# Patient Record
Sex: Female | Born: 1950 | Race: White | Hispanic: No | Marital: Married | Smoking: Never smoker
Health system: Southern US, Community
[De-identification: ages and names within clinical notes are randomized; demographics above are authoritative.]

## PROBLEM LIST (undated history)

## (undated) DIAGNOSIS — K219 Gastro-esophageal reflux disease without esophagitis: Secondary | ICD-10-CM

## (undated) DIAGNOSIS — Z8669 Personal history of other diseases of the nervous system and sense organs: Secondary | ICD-10-CM

## (undated) DIAGNOSIS — I6522 Occlusion and stenosis of left carotid artery: Secondary | ICD-10-CM

## (undated) DIAGNOSIS — I10 Essential (primary) hypertension: Secondary | ICD-10-CM

## (undated) DIAGNOSIS — L4050 Arthropathic psoriasis, unspecified: Secondary | ICD-10-CM

## (undated) DIAGNOSIS — I82401 Acute embolism and thrombosis of unspecified deep veins of right lower extremity: Secondary | ICD-10-CM

## (undated) DIAGNOSIS — T7840XA Allergy, unspecified, initial encounter: Secondary | ICD-10-CM

## (undated) DIAGNOSIS — R112 Nausea with vomiting, unspecified: Secondary | ICD-10-CM

## (undated) DIAGNOSIS — M069 Rheumatoid arthritis, unspecified: Secondary | ICD-10-CM

## (undated) DIAGNOSIS — I011 Acute rheumatic endocarditis: Secondary | ICD-10-CM

## (undated) DIAGNOSIS — H269 Unspecified cataract: Secondary | ICD-10-CM

## (undated) DIAGNOSIS — M1990 Unspecified osteoarthritis, unspecified site: Secondary | ICD-10-CM

## (undated) DIAGNOSIS — R238 Other skin changes: Secondary | ICD-10-CM

## (undated) DIAGNOSIS — R011 Cardiac murmur, unspecified: Secondary | ICD-10-CM

## (undated) DIAGNOSIS — C801 Malignant (primary) neoplasm, unspecified: Secondary | ICD-10-CM

## (undated) DIAGNOSIS — R0981 Nasal congestion: Secondary | ICD-10-CM

## (undated) DIAGNOSIS — Z9889 Other specified postprocedural states: Secondary | ICD-10-CM

## (undated) HISTORY — DX: Rheumatoid arthritis, unspecified: M06.9

## (undated) HISTORY — DX: Arthropathic psoriasis, unspecified: L40.50

## (undated) HISTORY — DX: Unspecified osteoarthritis, unspecified site: M19.90

## (undated) HISTORY — DX: Gastro-esophageal reflux disease without esophagitis: K21.9

## (undated) HISTORY — DX: Personal history of other diseases of the nervous system and sense organs: Z86.69

## (undated) HISTORY — DX: Malignant (primary) neoplasm, unspecified: C80.1

## (undated) HISTORY — DX: Acute rheumatic endocarditis: I01.1

## (undated) HISTORY — DX: Unspecified cataract: H26.9

## (undated) HISTORY — PX: ABDOMINAL HYSTERECTOMY: SHX81

## (undated) HISTORY — DX: Acute embolism and thrombosis of unspecified deep veins of right lower extremity: I82.401

## (undated) HISTORY — PX: SKIN CANCER EXCISION: SHX779

## (undated) HISTORY — DX: Essential (primary) hypertension: I10

## (undated) HISTORY — DX: Cardiac murmur, unspecified: R01.1

## (undated) HISTORY — DX: Occlusion and stenosis of left carotid artery: I65.22

## (undated) HISTORY — DX: Other skin changes: R23.8

## (undated) HISTORY — PX: BREAST EXCISIONAL BIOPSY: SUR124

## (undated) HISTORY — PX: SPINE SURGERY: SHX786

## (undated) HISTORY — DX: Nasal congestion: R09.81

## (undated) HISTORY — DX: Allergy, unspecified, initial encounter: T78.40XA

## (undated) HISTORY — PX: TOTAL VAGINAL HYSTERECTOMY: SHX2548

---

## 1999-09-17 ENCOUNTER — Other Ambulatory Visit: Admission: RE | Admit: 1999-09-17 | Discharge: 1999-09-17 | Payer: Self-pay | Admitting: Obstetrics & Gynecology

## 2000-09-23 ENCOUNTER — Other Ambulatory Visit: Admission: RE | Admit: 2000-09-23 | Discharge: 2000-09-23 | Payer: Self-pay | Admitting: Obstetrics & Gynecology

## 2001-04-16 ENCOUNTER — Encounter (INDEPENDENT_AMBULATORY_CARE_PROVIDER_SITE_OTHER): Payer: Self-pay | Admitting: *Deleted

## 2001-04-16 ENCOUNTER — Ambulatory Visit (HOSPITAL_COMMUNITY): Admission: RE | Admit: 2001-04-16 | Discharge: 2001-04-16 | Payer: Self-pay | Admitting: Obstetrics & Gynecology

## 2001-11-09 ENCOUNTER — Encounter (INDEPENDENT_AMBULATORY_CARE_PROVIDER_SITE_OTHER): Payer: Self-pay | Admitting: Specialist

## 2001-11-09 ENCOUNTER — Observation Stay (HOSPITAL_COMMUNITY): Admission: RE | Admit: 2001-11-09 | Discharge: 2001-11-10 | Payer: Self-pay | Admitting: Obstetrics & Gynecology

## 2002-06-22 ENCOUNTER — Other Ambulatory Visit: Admission: RE | Admit: 2002-06-22 | Discharge: 2002-06-22 | Payer: Self-pay | Admitting: Obstetrics & Gynecology

## 2002-12-13 ENCOUNTER — Encounter: Admission: RE | Admit: 2002-12-13 | Discharge: 2002-12-13 | Payer: Self-pay | Admitting: Obstetrics & Gynecology

## 2002-12-13 ENCOUNTER — Encounter: Payer: Self-pay | Admitting: Obstetrics & Gynecology

## 2002-12-20 ENCOUNTER — Encounter: Payer: Self-pay | Admitting: General Surgery

## 2002-12-20 ENCOUNTER — Ambulatory Visit (HOSPITAL_BASED_OUTPATIENT_CLINIC_OR_DEPARTMENT_OTHER): Admission: RE | Admit: 2002-12-20 | Discharge: 2002-12-20 | Payer: Self-pay | Admitting: General Surgery

## 2002-12-20 ENCOUNTER — Encounter (INDEPENDENT_AMBULATORY_CARE_PROVIDER_SITE_OTHER): Payer: Self-pay | Admitting: *Deleted

## 2002-12-20 ENCOUNTER — Encounter: Admission: RE | Admit: 2002-12-20 | Discharge: 2002-12-20 | Payer: Self-pay | Admitting: General Surgery

## 2003-06-24 ENCOUNTER — Other Ambulatory Visit: Admission: RE | Admit: 2003-06-24 | Discharge: 2003-06-24 | Payer: Self-pay | Admitting: Obstetrics & Gynecology

## 2004-01-10 ENCOUNTER — Encounter: Admission: RE | Admit: 2004-01-10 | Discharge: 2004-01-10 | Payer: Self-pay | Admitting: Obstetrics & Gynecology

## 2004-05-17 ENCOUNTER — Emergency Department (HOSPITAL_COMMUNITY): Admission: EM | Admit: 2004-05-17 | Discharge: 2004-05-17 | Payer: Self-pay | Admitting: Emergency Medicine

## 2004-08-20 ENCOUNTER — Encounter: Admission: RE | Admit: 2004-08-20 | Discharge: 2004-08-20 | Payer: Self-pay | Admitting: Obstetrics & Gynecology

## 2004-09-16 ENCOUNTER — Emergency Department (HOSPITAL_COMMUNITY): Admission: EM | Admit: 2004-09-16 | Discharge: 2004-09-16 | Payer: Self-pay | Admitting: Family Medicine

## 2004-09-24 ENCOUNTER — Other Ambulatory Visit: Admission: RE | Admit: 2004-09-24 | Discharge: 2004-09-24 | Payer: Self-pay | Admitting: Obstetrics & Gynecology

## 2004-10-26 ENCOUNTER — Ambulatory Visit: Payer: Self-pay | Admitting: Cardiology

## 2004-11-23 ENCOUNTER — Emergency Department (HOSPITAL_COMMUNITY): Admission: EM | Admit: 2004-11-23 | Discharge: 2004-11-23 | Payer: Self-pay | Admitting: Family Medicine

## 2005-01-22 ENCOUNTER — Ambulatory Visit (HOSPITAL_COMMUNITY): Admission: RE | Admit: 2005-01-22 | Discharge: 2005-01-22 | Payer: Self-pay | Admitting: Obstetrics & Gynecology

## 2005-09-25 DIAGNOSIS — C439 Malignant melanoma of skin, unspecified: Secondary | ICD-10-CM

## 2005-09-25 HISTORY — DX: Malignant melanoma of skin, unspecified: C43.9

## 2005-10-18 ENCOUNTER — Other Ambulatory Visit: Admission: RE | Admit: 2005-10-18 | Discharge: 2005-10-18 | Payer: Self-pay | Admitting: Obstetrics & Gynecology

## 2006-01-29 ENCOUNTER — Encounter: Admission: RE | Admit: 2006-01-29 | Discharge: 2006-01-29 | Payer: Self-pay | Admitting: Obstetrics & Gynecology

## 2006-10-09 DIAGNOSIS — D229 Melanocytic nevi, unspecified: Secondary | ICD-10-CM

## 2006-10-09 HISTORY — DX: Melanocytic nevi, unspecified: D22.9

## 2007-02-03 ENCOUNTER — Encounter: Admission: RE | Admit: 2007-02-03 | Discharge: 2007-02-03 | Payer: Self-pay | Admitting: Obstetrics & Gynecology

## 2009-07-17 ENCOUNTER — Ambulatory Visit (HOSPITAL_COMMUNITY): Admission: RE | Admit: 2009-07-17 | Discharge: 2009-07-17 | Payer: Self-pay | Admitting: Neurosurgery

## 2009-11-07 ENCOUNTER — Ambulatory Visit (HOSPITAL_COMMUNITY): Admission: RE | Admit: 2009-11-07 | Discharge: 2009-11-07 | Payer: Self-pay | Admitting: Neurosurgery

## 2010-08-25 ENCOUNTER — Encounter: Payer: Self-pay | Admitting: Obstetrics & Gynecology

## 2010-08-27 ENCOUNTER — Other Ambulatory Visit: Payer: Self-pay

## 2010-08-31 ENCOUNTER — Encounter
Admission: RE | Admit: 2010-08-31 | Discharge: 2010-08-31 | Payer: Self-pay | Source: Home / Self Care | Attending: Gastroenterology | Admitting: Gastroenterology

## 2010-11-06 LAB — BASIC METABOLIC PANEL
BUN: 9 mg/dL (ref 6–23)
CO2: 27 mEq/L (ref 19–32)
Calcium: 9.2 mg/dL (ref 8.4–10.5)
Chloride: 105 mEq/L (ref 96–112)
Creatinine, Ser: 0.95 mg/dL (ref 0.4–1.2)
GFR calc Af Amer: >60 mL/min (ref >60)
GFR calc non Af Amer: >60 mL/min (ref >60)
Glucose, Bld: 106 mg/dL — ABNORMAL HIGH (ref 70–99)
Potassium: 4 mEq/L (ref 3.5–5.1)
Sodium: 137 mEq/L (ref 135–145)

## 2010-11-06 LAB — CBC
HCT: 35.8 % — ABNORMAL LOW (ref 36.0–46.0)
Hemoglobin: 12.3 g/dL (ref 12.0–15.0)
MCHC: 34.4 g/dL (ref 30.0–36.0)
MCV: 89.2 fL (ref 78.0–100.0)
Platelets: 196 10*3/uL (ref 150–400)
RBC: 4.01 MIL/uL (ref 3.87–5.11)
RDW: 14 % (ref 11.5–15.5)
WBC: 6.8 10*3/uL (ref 4.0–10.5)

## 2010-12-21 NOTE — H&P (Signed)
[facility]  Patient:    Connie Cisneros, Connie Cisneros Visit Number: [id number] MRN: [id number]          Service Type: DSU Location: 9300 [id number] Attending Physician:  Minette Headland Dictated by:   Freddy Finner, M.D. Admit Date:  11/09/2001                           History and Physical  ADMITTING DIAGNOSES:          1. Uterine leiomyomata.                               2. Menorrhagia.                               3. Intolerance of oral contraceptives.                               4. No improvement in menorrhagia with cyclic                                  hormonal therapy including Prometrium 12 days                                  a cycle.                               5. Failed hysteroscopy, dilatation and curettage                                  in September 2002.  HISTORY OF PRESENT ILLNESS:   The patient is a 60 year old white married female with three living children who has symptoms as noted in the admitting diagnoses.  She has elected now to proceed with definitive surgical intervention and is admitted now for laparoscopically-assisted vaginal hysterectomy and bilateral salpingo-oophorectomy.  She has reviewed a video in the office describing the operative procedure including the potential risks of the procedure and possible complications.  She is prepared to proceed.  REVIEW OF SYSTEMS:            Her current review of systems is otherwise negative.  There are no cardiopulmonary, GI or GU complaints.  PAST MEDICAL HISTORY:         The patient has no known significant medical illnesses.  She has never had a blood transfusion.  MEDICATIONS:                  She does take Wellbutrin 150 mg SR b.i.d. She is on no other current medications.  She did try Mircette for control of menorrhagia with frequent headaches and irregular menses.  PAST SURGICAL HISTORY:        Previous operative procedures include hysteroscopy, D&C.  She has had  three vaginal deliveries.  SOCIAL HISTORY:               She does not use cigarettes.  ALLERGIES:  She has no known allergies to medications.  FAMILY HISTORY:               Mother died of breast cancer.  An older sister died at 53 years old of bone tumor.  Family history is otherwise noncontributory.  PHYSICAL EXAMINATION:  VITAL SIGNS:                  Blood pressure in the office is 110/80.  HEENT:                        Grossly within normal limits.  NECK:                         The thyroid gland is not palpably enlarged to my examination.  CHEST:                        Clear to auscultation.  HEART:                        Normal sinus rhythm without murmurs, rubs or gallops.  BREASTS:                      Exam is normal.  There are no palpable masses. No skin change.  No nipple discharge.  (Recent mammogram in December 2002 was normal).  ABDOMEN:                      Soft and nontender without appreciable organomegaly or palpable masses.  PELVIC:                       External genitalia, vagina and cervix are normal.  Bimanual reveals the uterus to be slightly enlarged.  There are no palpable adnexal masses.  RECTAL:                       The rectum is normal.  The rectovaginal exam confirms.  LABORATORY DATA:              Ultrasound in the office showed numerous small leiomyomata, and this was noted by ultrasound.  ASSESSMENT:                   1. Uterine fibroids.                               2. Menorrhagia unresponsive to conservative                                  therapy.  PLAN:                         Laparoscopically-assisted vaginal hysterectomy and bilateral salpingo-oophorectomy. Dictated by:   Freddy Finner, M.D. Attending Physician:  Minette Headland DD:  11/08/01 TD:  11/08/01 Job: [phone]

## 2010-12-21 NOTE — Discharge Summary (Signed)
[facility]  Patient:    Connie Cisneros, Connie Cisneros Visit Number: [id number] MRN: [id number]          Service Type: DSU Location: 9300 [id number] Attending Physician:  Minette Headland Dictated by:   Freddy Finner, M.D. Admit Date:  11/09/2001 Discharge Date: 11/10/2001                             Discharge Summary  DISCHARGE DIAGNOSES: 1. Uterine leiomyomata. 2. Clinical symptoms of menorrhagia, unresponsive to cyclic hormonal therapy    and conservative surgery with dilatation and curettage.  OPERATIVE PROCEDURE:  Laparoscopically assisted vaginal hysterectomy, bilateral salpingo-oophorectomy.  INTRAOPERATIVE AND POSTOPERATIVE COMPLICATIONS:  None.  DISPOSITION:  The patient is in satisfactory and improved condition at the time of her discharge.  ACTIVITY:  Progressively increasing physical activity.  She is to avoid vaginal entry.  WOUND CARE:  She is to call for fever, severe pain, or heavy bleeding.  DISCHARGE MEDICATIONS: 1. Vicodin p.r.n. postoperative pain. 2. Premarin 0.625 mg q.d.  FOLLOWUP:  Return to the office in two weeks for her first postoperative visit.  For details of the present illness, past surgical history, family history, review of systems, and physical examination is in the admission and operative summary.  ADMISSION FINDINGS:  Remarkable for enlargement of the uterus with irregular nodularity consistent with fibroids.  LABORATORY DATA:  CBC on admission with hemoglobin of 12.2, platelets of 211,000, white count of 4.1.  Postoperatively, her hemoglobin was 10.  Her admission prothrombin time, PTT, and INR were all normal.  Admission urinalysis was unremarkable.  PATHOLOGY REPORT:  Not available at time of dictation.  HOSPITAL COURSE:  The patient was admitted on the morning of surgery.  She was treated perioperatively with TIS hose and with IV Cefotan.  The above described operative procedure was accomplished  without difficulty.  The patient tolerated the operative procedure well.  Her postoperative recovery was without complications.  By the afternoon of the first postoperative day, she was ambulating without difficulty, having adequate bowel and bladder function, and tolerating a regular diet.  She was discharged home with disposition as noted above. Dictated by:   Freddy Finner, M.D. Attending Physician:  Minette Headland DD:  11/10/01 TD:  11/11/01 Job: [id number]

## 2010-12-21 NOTE — Op Note (Signed)
[facility]  Patient:    Connie Cisneros, Connie Cisneros Visit Number: [id number] MRN: [id number]          Service Type: DSU Location: [facility] Attending Physician:  Minette Headland Proc. Date: 04/16/01 Admit Date:  04/16/2001                             Operative Report  PREOPERATIVE DIAGNOSES:       1. Uterine fibroids.                               2. Endometrial thickening.                               3. Menorrhagia.                               4. Intolerance of oral contraceptives.  POSTOPERATIVE DIAGNOSES:      1. Uterine fibroids.                               2. Endometrial thickening.                               3. Menorrhagia.                               4. Intolerance of oral contraceptives.  OPERATION:                    Hysteroscopy D&C.  SURGEON:                      Freddy Finner, M.D.  ANESTHESIA:                   General.  INTRAOPERATIVE COMPLICATIONS:                None.  ESTIMATED INTRAOPERATIVE BLOOD LOSS:    20 cc.  INTRAOPERATIVE SORBITOL DEFICIT:             75 cc.  INDICATIONS:                  The patient is a 60 year old, who has had menometrorrhagia.  She has been tried on oral contraceptives with frequent side effects requiring her to stop.  On recent pelvic ultrasound, she had endometrial thickening.  She had uterine leiomyomata.  She is admitted now for hysteroscopy D&C.  INTRAOPERATIVE FINDINGS:      There was a suggestion of a broad-based, flat polyp anteriorly, and initially the uterus looked like it had a midline septum which was very small.  This disappeared after curettage.  DESCRIPTION OF PROCEDURE:     The patient was brought to the operating room, placed under adequate general anesthesia, placed in the dorsal lithotomy position, using the Allen stirrup system.  Betadine prep was carried out in the usual fashion.  Bivalve speculum was introduced.  Cervix was grasped with a single-tooth tenaculum,  progressively dilated to 23 with Pratts.  The ACMI 12.5 degree hysteroscope was introduced using sorbitol 3% as the distending medium.  Examination of the  cavity was carried out with findings as noted above.  Gentle, thorough curettage was then carried out and exploration with the polyp forceps.  Reinspection revealed absence of what was possibly a broad-based polyp and the appearance of a septum.  The cavity at this point was normal except for being slightly enlarged.  There was no evidence of submucous myoma.  The procedure at this point was terminated, all instruments removed.  The patient was taken to the recovery room in good condition.  She will be discharged with routine outpatient surgical instructions with follow-up in the office in 7-10 days. Attending Physician:  Minette Headland DD:  04/16/01 TD:  04/16/01 Job: [id number]

## 2010-12-21 NOTE — Op Note (Signed)
[facility]  Patient:    Connie Cisneros, Connie Cisneros Visit Number: [id number] MRN: [id number]          Service Type: DSU Location: 9300 [id number] Attending Physician:  Minette Headland Dictated by:   Freddy Finner, M.D. Proc. Date: 11/09/01 Admit Date:  11/09/2001                             Operative Report  PREOPERATIVE DIAGNOSIS:  Fibroids.  POSTOPERATIVE DIAGNOSIS:  Fibroids.  OPERATION: 1. Laparoscopic-assisted vaginal hysterectomy. 2. Bilateral salpingo-oophorectomy.  SURGEON:  Freddy Finner, M.D.  ASSISTANT:  Duke Salvia. Marcelle Overlie, M.D.  ANESTHESIA:  General endotracheal anesthesia.  ESTIMATED BLOOD LOSS: 250 cc.  INTRAOPERATIVE COMPLICATIONS:  None.  INDICATIONS:  Details of History of Present Illness recorded in admission note.  DESCRIPTION OF PROCEDURE:  The patient was admitted on the morning of surgery. She was given a bolus of Ceftin IV preoperatively.  She was placed in PAS hose.  She was brought to the operating room, placed under adequate general endotracheal anesthesia.  The abdomen, perineum, and vagina were prepped in the usual fashion with scrub followed by solution.  Sterile drapes were applied.  Bladder was evacuated with [facility] catheter.  Hulka tenaculum was attached to the cervix.  Sterile drape was then applied.  Two small incisions were made, one at the umbilicus and one just above the symphysis.  In the upper incision, a 10 mm trocar was introduced without difficulty.  Laparoscope revealed adequate placement without evidence of injury on entry. Pneumoperitoneum was allowed to accumulate with carbon dioxide gas.  A 5 mm trocar was placed through the lower incision under direct visualization. Blunt probe and grasping forceps were used through this trocar sleeve.  Systematic examination of pelvic and abdominal contents carried out.  The only abnormality noted was the uterine fibroids.  Using the grasping forceps  and the plasma coagulator device through the operating channel of the laparoscope, the fallopian tube was elevated.  The broad ligament and infundibulopelvic ligament were controlled with bipolar coagulation sharp division.  This was carried down to the level at or just above the uterine arteries.  Attention was then turned vaginally.  Posterior vaginal retractor was placed. Hulka tenaculum was removed.  Cervix was grasped with the [facility] tenaculum. Colpotomy incision was made while tenting the cul-de-sac with an Allis. Cervix was circumscribed with a scalpel to release the mucosa.  The bladder was advanced off the cervix.  The plasma coagulator system for vaginal application was then used to develop the uterosacral, the bladder pillars, and the cardinal ligament pedicles.  The anterior peritoneum was entered.  The patient was given IV indigo carmine to be certain of absence of bladder perforation, but this was not felt to be the case and no spillage was noted. The vessel pedicles were taken with the Ligasure system and coagulated.  The uterus was then delivered through the vaginal introitus.  Angles of the vagina were anchored to the uterosacrals with mattress sutures of 0 Monocryl.  The uterosacrals were plicated with 0 Monocryl.  Closed with figure-of-eight of 0 Monocryl.  Foley was placed.  Reinspection abdominally through the laparoscopic instruments was carried out. Copious irrigation with the Nezhat irrigator aspirator was carried out.  Small bleeding sources near the cuff and along the left side were controlled with the plasma coagulation system.  Once complete hemostasis was achieved, the procedure was terminated.  Gas was allowed  to escape from the abdomen, and all the irrigating solution was aspirated.  The incisions were closed with interrupted subcuticular sutures of 3-0 Dexon.  The patient tolerated the procedure well and was taken to the recovery room in good  condition. Dictated by:   Freddy Finner, M.D. Attending Physician:  Minette Headland DD:  11/09/01 TD:  11/09/01 Job: [phone]

## 2010-12-21 NOTE — Op Note (Signed)
   NAME:  Connie Cisneros, Connie Cisneros                       ACCOUNT NO.:  [id number]   MEDICAL RECORD NO.:  [id number]                   PATIENT TYPE:  AMB   LOCATION:  DSC                                  FACILITY:  [facility]   PHYSICIAN:  Rose Phi. Young, M.D.                DATE OF BIRTH:  May 31, 1951   DATE OF PROCEDURE:  12/20/2002  DATE OF DISCHARGE:                                 OPERATIVE REPORT   PREOPERATIVE DIAGNOSIS:  Possible radial scar of the left breast.   POSTOPERATIVE DIAGNOSIS:  Possible radial scar of the left breast.   PROCEDURE:  Excision of left breast mass with needle localization and  specimen mammography.   SURGEON:  Rose Phi. Maple Hudson, M.D.   ANESTHESIA:  MAC.   DESCRIPTION OF PROCEDURE:  The patient was placed on the operating table and  the left breast prepped and draped in the usual fashion.  The previously-  placed wire was in the about 1 o'clock position of the left breast.  A  curvilinear incision was then outlined incorporating the wire puncture site.  The area was then thoroughly infiltrated with local anesthetic.  An incision  was made and a wide excision of the wire and surrounding tissue was carried  out.  Hemostasis obtained with the cautery.   Specimen mammography confirmed the removal of the lesion.   Subcuticular closure of 4-0 Monocryl and Steri-Strips carried out.  Dressing  applied.  The patient transferred to the recovery room in satisfactory  condition, having tolerated the procedure well.                                               Rose Phi. Maple Hudson, M.D.    PRY/MEDQ  D:  12/20/2002  T:  12/21/2002  Job:  [id number]   cc:   Freddy Finner, M.D.  [address] [place]  [place] [address]  Fax: [phone]

## 2011-07-02 ENCOUNTER — Other Ambulatory Visit: Payer: Self-pay | Admitting: Physician Assistant

## 2011-09-03 ENCOUNTER — Other Ambulatory Visit: Payer: Self-pay | Admitting: Obstetrics & Gynecology

## 2011-09-03 DIAGNOSIS — N63 Unspecified lump in unspecified breast: Secondary | ICD-10-CM

## 2011-09-10 ENCOUNTER — Ambulatory Visit
Admission: RE | Admit: 2011-09-10 | Discharge: 2011-09-10 | Disposition: A | Discharge status: Home / Self Care | Payer: 59 | Source: Ambulatory Visit | Attending: Obstetrics & Gynecology | Admitting: Obstetrics & Gynecology

## 2011-09-10 DIAGNOSIS — N63 Unspecified lump in unspecified breast: Secondary | ICD-10-CM

## 2013-04-13 DIAGNOSIS — R5383 Other fatigue: Secondary | ICD-10-CM | POA: Insufficient documentation

## 2013-05-07 ENCOUNTER — Ambulatory Visit (INDEPENDENT_AMBULATORY_CARE_PROVIDER_SITE_OTHER): Payer: 59 | Admitting: Podiatrist

## 2013-05-07 ENCOUNTER — Encounter: Payer: Self-pay | Admitting: Podiatrist

## 2013-05-07 VITALS — BP 178/89 | HR 69 | Resp 16 | Ht 63.0 in | Wt 167.0 lb

## 2013-05-07 DIAGNOSIS — L600 Ingrowing nail: Secondary | ICD-10-CM

## 2013-05-07 NOTE — Patient Instructions (Signed)
Betadine Soak Instructions  Purchase an 8 oz. bottle of BETADINE solution (Povidone)  THE DAY AFTER THE PROCEDURE  Place 1 tablespoon of betadine solution in a quart of warm tap water.  Submerge your foot or feet with outer bandage intact for the initial soak; this will allow the bandage to become moist and wet for easy lift off.  Once you remove your bandage, continue to soak in the solution for 20 minutes.  This soak should be done twice a day.  Next, remove your foot or feet from solution, blot dry the affected area and cover.  You may use a band aid large enough to cover the area or use gauze and tape.  Apply other medications to the area as directed by the doctor such as cortisporin otic solution (ear drops) or neosporin.  IF YOUR SKIN BECOMES IRRITATED WHILE USING THESE INSTRUCTIONS, IT IS OKAY TO SWITCH TO EPSOM SALTS AND WATER OR ANTIBACTERIAL SOAP AND WATER

## 2013-05-07 NOTE — Progress Notes (Signed)
  Chief Complaint  Patient presents with  . Ingrown Toenail    left 2nd toe- lateral side,  I think I have an ingrown toenail.  It's been red and swollen.     HPI: Patient is 62 y.o. female who presents today for pain left 2nd toenail lateral side.  Patient states she's had ingrown toenails in the past and she's had several procedures to remove the ingrown toenails which have all been successful. Patient denies any drainage or streaking to the toe however she states it is swollen and uncomfortable.   Review of Systems  DATA OBTAINED: from patient GENERAL: Feels well no fevers, no fatigue, no changes in appetite SKIN: No itching, no rashes, no open lesions, no wounds EYES: No eye pain,no redness, no discharge EARS: No earache,no ringing of ears, no recent change in hearing NOSE: No congestion, no drainage, no bleeding  MOUTH/THROAT: No mouth pain, No sore throat, No difficulty chewing or swallowing  RESPIRATORY: No cough, no wheezing, no SOB CARDIAC: No chest pain,no heart palpitations,no new onset lower extremity edema  GI: No abdominal pain, No Nausea, no vomiting, no diarrhea, no heartburn or no reflux  GU: No dysuria, no increased frequency or urgency MUSCULOSKELETAL: No unrelieved bone/joint pain,  NEUROLOGIC: Awake, alert, appropriate to situation, No change in mental status. PSYCHIATRIC: No overt anxiety or sadness.No behavior issue.  AMBULATION:  Ambulates unassisted    Physical Exam  GENERAL APPEARANCE: Alert, conversant. Appropriately groomed. No acute distress.  VASCULAR: Pedal pulses palpable and strong bilateral.  Capillary refill time is immediate to all digits,  Proximal to distal cooling it warm to warm.  Digital hair growth is present bilateral  NEUROLOGIC: sensation is intact epicritically and protectively to 5.07 monofilament at 5/5 sites bilateral.  Light touch is intact bilateral, vibratory sensation intact bilateral, achilles tendon reflex is intact bilateral.   MUSCULOSKELETAL: acceptable muscle strength, tone and stability bilateral.  Intrinsic muscluature intact bilateral.  Rectus appearance of foot and digits noted bilateral.   DERMATOLOGIC: skin color, texture, and turger are within normal limits.  No preulcerative lesions are seen, no interdigital maceration noted.  No open lesions present.  Digital nails are asymptomatic. With the exception of the left second toenail lateral nail border which is painful and symptomatic with pressure. Incurvation of the lateral nail border is also noted with pain with pressure noted.   Assessment   Ingrown toenail second toe left lateral nail border  Plan  No orders of the defined types were placed in this encounter.    Treatment options and alternatives discussed.  Recommended permanent phenol matrixectomy and patient agreed.  Left 2nd toe was prepped with alcohol and a 1 to 1 mix of 0.5% marcaine plain and 2% lidocaine plain was administered in a digital block fashion.  The toe was then prepped with betadine solution and exsanguinated.  The offending nail border was then excised and matrix tissue exposed.  Phenol was then applied to the matrix tissue followed by an alcohol wash.  Antibiotic ointment and a dry sterile dressing was applied.  The patient was dispensed instructions for aftercare.     Delories Heinz, DPM

## 2013-07-20 ENCOUNTER — Ambulatory Visit (INDEPENDENT_AMBULATORY_CARE_PROVIDER_SITE_OTHER): Payer: 59 | Admitting: Cardiovascular Disease

## 2013-07-20 ENCOUNTER — Encounter: Payer: Self-pay | Admitting: Cardiovascular Disease

## 2013-07-20 VITALS — BP 150/84 | HR 70 | Ht 64.0 in | Wt 166.5 lb

## 2013-07-20 DIAGNOSIS — G43909 Migraine, unspecified, not intractable, without status migrainosus: Secondary | ICD-10-CM | POA: Insufficient documentation

## 2013-07-20 DIAGNOSIS — I6522 Occlusion and stenosis of left carotid artery: Secondary | ICD-10-CM

## 2013-07-20 DIAGNOSIS — E785 Hyperlipidemia, unspecified: Secondary | ICD-10-CM

## 2013-07-20 DIAGNOSIS — I10 Essential (primary) hypertension: Secondary | ICD-10-CM

## 2013-07-20 DIAGNOSIS — E041 Nontoxic single thyroid nodule: Secondary | ICD-10-CM

## 2013-07-20 DIAGNOSIS — I6529 Occlusion and stenosis of unspecified carotid artery: Secondary | ICD-10-CM

## 2013-07-20 NOTE — Progress Notes (Signed)
Patient ID: Connie Cisneros, female   DOB: 12-31-1950, 62 y.o.   MRN: [id number]     PATIENT PROFILE:  [name] is a 62 year old female who is referred for cardiology evaluation through the courtesy of Dr. Herb Grays.   HPI: [name] is a 62 year old female who has a history of somewhat labile blood pressure. She also has a history of migraine headaches. Recently, she was started on losartan at 100 mg as well as verapamil 120 mg. She previously had been on losartan HCTZ. She recently underwent evaluation of possible thyroid cyst which showed a normal thyroid size and scattered small colloid cysts and partially septated cysts. There was no evidence for any solid nodules. As part of that evaluation note is made of some mild plaque formation involving the bilateral carotid bulbs. She tells me last week she underwent a comprehensive carotid duplex exam at [facility] health care. I finally was able to obtain the results of this procedure which showed less than 20% diameter reduction of the left proximal internal carotid artery not felt to be hemodynamically significant. Her right internal carotid was normal. She had normal antegrade vertebral flow.  [name] admits to a history of a heart murmur. She denies any episodes of chest pain. In 2011, she did undergo a nuclear perfusion study which revealed normal perfusion and function without scar or ischemia. An echo Doppler study done in 2011 showed normal systolic function with mild mitral regurgitation and mild tricuspid regurgitation. Presently, she denies any episodes of chest pain. She denies presyncope or syncope. Her blood pressure has been somewhat labile. She presents for evaluation.  Past Medical History  Diagnosis Date  . Sinus congestion   . 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 Hypertension   . Cancer   . Heart murmur   . GERD (gastroesophageal reflux disease)   . Bruises easily   . History of  migraine headaches     Past Surgical History  Procedure Laterality Date  . Total vaginal hysterectomy      Allergies  Allergen Reactions  . Codeine Nausea Only  . Sulfa Antibiotics Nausea Only    Current Outpatient Prescriptions  Medication Sig Dispense Refill  . atorvastatin (LIPITOR) 20 MG tablet Take 20 mg by mouth daily.      . cholecalciferol (VITAMIN D) 1000 UNITS tablet Take 1,000 Units by mouth daily.      . diphenhydrAMINE (BENADRYL) 25 MG tablet Take 25 mg by mouth 1 day or 1 dose.      . estradiol (ESTRACE) 2 MG tablet Take 2 mg by mouth daily.      Marland Kitchen losartan (COZAAR) 100 MG tablet Take 100 mg by mouth daily.      . verapamil (VERELAN PM) 120 MG 24 hr capsule Take 120 mg by mouth at bedtime.      . B-D INS SYR MICROFINE 1CC/28G 28G X 1/2" 1 ML MISC Self allergy injections       No current facility-administered medications for this visit.    Social history is notable in that she is married for 38 years. She has 3 children. She works for the [employer]. She completed 3 years of college. There is no tobacco or alcohol use. She does walk approximately 3-4 days per week.  Family History  Problem Relation Age of Onset  . Cancer Mother   . Diabetes Father   . Heart disease Father   .  Cancer Sister   . Heart disease Paternal Grandmother   . Heart disease Paternal Grandfather     ROS is negative for fever chills or night sweats. She denies significant change in weight. She denies vision changes or hearing changes. She is unaware of any lymphadenopathy. She denies increasing shortness of breath. There is no cough. There is no wheezing. She denies musculoskeletal complaints. She does have thyroid nodules. She denies cold or heat intolerance. There is no diabetes. She denies chest pressure. She denies presyncope or syncope. She denies nausea vomiting or diarrhea. There is no reflux. She does have a history of hyperlipidemia. She denies change in bowel or bladder habits.  She denies blood in stool or urine. She denies claudication. She denies tremors. There are no myalgias. There are no neurologic symptoms. She denies difficulty with sleep. Other comprehensive 14 point system review is negative.  PE BP 150/84  Pulse 70  Ht 5\' 4"  (1.626 m)  Wt 166 lb 8 oz (75.524 kg)  BMI 28.57 kg/m2 General: Alert, oriented, no distress.  Skin: normal turgor, no rashes HEENT: Normocephalic, atraumatic. Pupils round and reactive; sclera anicteric; Fundi mild increased vessel tortuosity. No hemorrhages or exudates. Nose without nasal septal hypertrophy Mouth/Parynx benign; Mallinpatti scale 2 Neck: No JVD, no carotid bruits Lungs: clear to ausculatation and percussion; no wheezing or rales Chest wall: without tenderness to palpitation Heart: RRR, s1 s2 normal 1-2/6 systolic murmur in the aortic area left sternal border Abdomen: soft, nontender; no hepatosplenomehaly, BS+; abdominal aorta nontender and not dilated by palpation. Back: no CVA tenderness Pulses 2+ Extremities: no clubbinbg cyanosis or edema, Homan's sign negative  Neurologic: grossly nonfocal; Cranial nerves grossly wnl Psychologic: Normal mood and affect    ECG: Normal sinus rhythm at 70 beats per minute. Normal intervals. Non specific ST changes  LABS:  BMET    Component Value Date/Time   NA 137 07/11/2009 1421   K 4.0 07/11/2009 1421   CL 105 07/11/2009 1421   CO2 27 07/11/2009 1421   GLUCOSE 106* 07/11/2009 1421   BUN 9 07/11/2009 1421   CREATININE 0.95 07/11/2009 1421   CALCIUM 9.2 07/11/2009 1421   GFRNONAA >60 07/11/2009 1421   GFRAA  Value: >60        The eGFR has been calculated using the MDRD equation. This calculation has not been validated in all clinical situations. eGFR's persistently <60 mL/min signify possible Chronic Kidney Disease. 07/11/2009 1421     Hepatic Function Panel  No results found for this basename: prot, albumin, ast, alt, alkphos, bilitot, bilidir, ibili     CBC      Component Value Date/Time   WBC 6.8 07/11/2009 1421   RBC 4.01 07/11/2009 1421   HGB 12.3 07/11/2009 1421   HCT 35.8* 07/11/2009 1421   PLT 196 07/11/2009 1421   MCV 89.2 07/11/2009 1421   MCHC 34.4 07/11/2009 1421   RDW 14.0 07/11/2009 1421     BNP No results found for this basename: probnp    Lipid Panel  No results found for this basename: chol, trig, hdl, cholhdl, vldl, ldlcalc     RADIOLOGY: No results found.   ASSESSMENT AND PLAN: My impression is that [name] is a 62 year old female who does have a history of hypertension, as well as family history for coronary disease in addition to hyperlipidemia. Her blood pressure today was mildly elevated. I am recommending further titration of her land from 120 mg to 180 mg. I was able to  obtain the report from [facility] concerning her carotid duplex scan which does not reveal significant carotid disease but only minimal plaque. She does have a cardiac murmur and I am recommending a  followup echo Doppler study for further evaluation. I was also able to review laboratory data she had done in September 2014 which revealed a normal chemistry profile. TSH was 1.873 B12 and folate levels were normal. Iron saturation was on the low normal side at 17%. Do not know her lipid panel the did discuss with her and want to be aggressive with reference to her lipid status and attempt to prevent plaque progression and may be worthwhile to consider doing an MR profile progressive assessment. I will see her back in the office in followup of the above studies further recommendations will be made at that time.   Lennette Bihari, MD, [facility] 07/20/2013 7:23 PM

## 2013-07-21 ENCOUNTER — Telehealth: Payer: Self-pay | Admitting: Cardiovascular Disease

## 2013-07-21 ENCOUNTER — Other Ambulatory Visit: Payer: Self-pay | Admitting: *Deleted

## 2013-07-21 DIAGNOSIS — I10 Essential (primary) hypertension: Secondary | ICD-10-CM

## 2013-07-21 DIAGNOSIS — E782 Mixed hyperlipidemia: Secondary | ICD-10-CM

## 2013-07-21 MED ORDER — VERAPAMIL HCL ER 180 MG PO TBCR: 180.0000 mg | EXTENDED_RELEASE_TABLET | Freq: Every day | ORAL | Status: DC

## 2013-07-21 NOTE — Addendum Note (Signed)
Addended byGaynelle Cage. on: 07/21/2013 12:32 PM   Modules accepted: Orders

## 2013-07-21 NOTE — Patient Instructions (Addendum)
Your physician has requested that you have an echocardiogram. Echocardiography is a painless test that uses sound waves to create images of your heart. It provides your doctor with information about the size and shape of your heart and how well your heart's chambers and valves are working. This procedure takes approximately one hour. There are no restrictions for this procedure.  Your physician recommends that you return for lab work fasting.  Your physician recommends that you schedule a follow-up appointment in: 4 WEEKS.  Your physician has recommended you make the following change in your medication: increase the veralan to 180 mg.

## 2013-07-21 NOTE — Telephone Encounter (Signed)
Spoke with patient.

## 2013-07-21 NOTE — Telephone Encounter (Signed)
Returning your call. °

## 2013-07-22 ENCOUNTER — Other Ambulatory Visit: Payer: Self-pay | Admitting: *Deleted

## 2013-07-22 ENCOUNTER — Encounter: Payer: Self-pay | Admitting: Cardiovascular Disease

## 2013-07-22 DIAGNOSIS — I10 Essential (primary) hypertension: Secondary | ICD-10-CM

## 2013-07-22 LAB — COMPREHENSIVE METABOLIC PANEL
ALT: 13 U/L (ref 0–35)
AST: 15 U/L (ref 0–37)
Albumin: 3.9 g/dL (ref 3.5–5.2)
Alkaline Phosphatase: 89 U/L (ref 39–117)
BUN: 11 mg/dL (ref 6–23)
CO2: 23 mEq/L (ref 19–32)
Calcium: 9.5 mg/dL (ref 8.4–10.5)
Chloride: 104 mEq/L (ref 96–112)
Creat: 0.85 mg/dL (ref 0.50–1.10)
Glucose, Bld: 78 mg/dL (ref 70–99)
Potassium: 4 mEq/L (ref 3.5–5.3)
Sodium: 139 mEq/L (ref 135–145)
Total Bilirubin: 0.5 mg/dL (ref 0.3–1.2)
Total Protein: 7.5 g/dL (ref 6.0–8.3)

## 2013-07-22 MED ORDER — VERAPAMIL HCL ER 180 MG PO TBCR: 180.0000 mg | EXTENDED_RELEASE_TABLET | Freq: Every day | ORAL | Status: DC

## 2013-07-23 LAB — NMR LIPOPROFILE WITH LIPIDS
Cholesterol, Total: 159 mg/dL (ref <200)
HDL Particle Number: 38.9 umol/L (ref >=30.5)
HDL Size: 9 nm — ABNORMAL LOW (ref >=9.2)
HDL-C: 54 mg/dL (ref >=40)
LDL (calc): 90 mg/dL (ref <100)
LDL Particle Number: 1249 nmol/L — ABNORMAL HIGH (ref <1000)
LDL Size: 20.3 nm — ABNORMAL LOW (ref >20.5)
LP-IR Score: 37 (ref <=45)
Large HDL-P: 6.4 umol/L (ref >=4.8)
Large VLDL-P: 1.7 nmol/L (ref <=2.7)
Small LDL Particle Number: 779 nmol/L — ABNORMAL HIGH (ref <=527)
Triglycerides: 75 mg/dL (ref <150)
VLDL Size: 42.2 nm (ref <=46.6)

## 2013-07-26 ENCOUNTER — Telehealth: Payer: Self-pay | Admitting: *Deleted

## 2013-07-26 NOTE — Telephone Encounter (Signed)
Message copied by Gaynelle Cage on Mon Jul 26, 2013  4:41 PM ------      Message from: Nicki Guadalajara A      Created: Mon Jul 26, 2013 10:19 AM       Inc atorvastatin to 40 mg daily ------

## 2013-07-26 NOTE — Telephone Encounter (Signed)
Left message to return a call to discuss lab results. 

## 2013-07-27 ENCOUNTER — Telehealth: Payer: Self-pay | Admitting: *Deleted

## 2013-07-27 MED ORDER — ATORVASTATIN CALCIUM 40 MG PO TABS: 40.0000 mg | ORAL_TABLET | Freq: Every day | ORAL | Status: DC

## 2013-07-27 NOTE — Telephone Encounter (Signed)
Patient returned a call to me from yesterday. Lab results and recommendations were given to patient per Dr. Landry Dyke instructions.

## 2013-07-27 NOTE — Telephone Encounter (Signed)
Message copied by Gaynelle Cage on Tue Jul 27, 2013  8:52 AM ------      Message from: Nicki Guadalajara A      Created: Mon Jul 26, 2013 10:19 AM       Inc atorvastatin to 40 mg daily ------

## 2013-08-03 ENCOUNTER — Ambulatory Visit (HOSPITAL_COMMUNITY)
Admission: RE | Admit: 2013-08-03 | Discharge: 2013-08-03 | Disposition: A | Discharge status: Home / Self Care | Payer: 59 | Source: Ambulatory Visit | Attending: Cardiovascular Disease | Admitting: Cardiovascular Disease

## 2013-08-03 ENCOUNTER — Telehealth: Payer: Self-pay | Admitting: Cardiovascular Disease

## 2013-08-03 DIAGNOSIS — R011 Cardiac murmur, unspecified: Secondary | ICD-10-CM | POA: Insufficient documentation

## 2013-08-03 DIAGNOSIS — I10 Essential (primary) hypertension: Secondary | ICD-10-CM

## 2013-08-03 DIAGNOSIS — G43909 Migraine, unspecified, not intractable, without status migrainosus: Secondary | ICD-10-CM | POA: Insufficient documentation

## 2013-08-03 DIAGNOSIS — K219 Gastro-esophageal reflux disease without esophagitis: Secondary | ICD-10-CM | POA: Insufficient documentation

## 2013-08-03 MED ORDER — ATORVASTATIN CALCIUM 40 MG PO TABS: 40.0000 mg | ORAL_TABLET | Freq: Every day | ORAL | Status: DC

## 2013-08-03 NOTE — Telephone Encounter (Signed)
Patient has not received her prescription for Lipitor 40 mg # 30 in the mail yet.  Please call this in to [facility] on [address].

## 2013-08-03 NOTE — Telephone Encounter (Signed)
Returned call and pt verified x 2.  Pt stated she still hasn't received the prescription in the mail and is out of the medicine she had at home.  Pt informed refill will be sent to [facility] [address] per request for 30-day supply.  Pt advised to call the office if Rx not received in the next few days so it can be sent electronically.  Pt verbalized understanding and stated she wants to see how she does on the increased dose before it is sent to mail order.  Refill(s) sent to pharmacy.

## 2013-08-03 NOTE — Progress Notes (Signed)
2D Echo Performed 08/03/2013    Clearence Ped, RCS

## 2013-08-27 ENCOUNTER — Encounter: Payer: Self-pay | Admitting: *Deleted

## 2013-09-01 ENCOUNTER — Ambulatory Visit (INDEPENDENT_AMBULATORY_CARE_PROVIDER_SITE_OTHER): Payer: 59 | Admitting: Cardiovascular Disease

## 2013-09-01 ENCOUNTER — Encounter: Payer: Self-pay | Admitting: Cardiovascular Disease

## 2013-09-01 VITALS — BP 158/86 | HR 69 | Ht 64.0 in | Wt 164.0 lb

## 2013-09-01 DIAGNOSIS — E785 Hyperlipidemia, unspecified: Secondary | ICD-10-CM

## 2013-09-01 DIAGNOSIS — I10 Essential (primary) hypertension: Secondary | ICD-10-CM

## 2013-09-01 DIAGNOSIS — G43909 Migraine, unspecified, not intractable, without status migrainosus: Secondary | ICD-10-CM

## 2013-09-01 DIAGNOSIS — I6529 Occlusion and stenosis of unspecified carotid artery: Secondary | ICD-10-CM

## 2013-09-01 MED ORDER — VERAPAMIL HCL ER 240 MG PO TBCR: 240.0000 mg | EXTENDED_RELEASE_TABLET | Freq: Every day | ORAL | Status: DC

## 2013-09-01 NOTE — Progress Notes (Signed)
Patient ID: CLARETHA TOWNSHEND, female   DOB: [birth year], 63 y.o.   MRN: [id number]      HPI:  Ms. Breindel Collier is a 63 year old female who I initially saw through the referral of Dr. Florina Ou 6 weeks ago. She now presents for cardiology followup evaluation.   Ms. Herbie Baltimore is a 63 year old female who has a history of somewhat labile blood pressure. She also has a history of migraine headaches. Recently, she was started on losartan at 100 mg as well as verapamil 120 mg. She previously had been on losartan HCTZ. She recently underwent evaluation of possible thyroid cyst which showed a normal thyroid size and scattered small colloid cysts and partially septated cysts. There was no evidence for any solid nodules. As part of that evaluation note is made of some mild plaque formation involving the bilateral carotid bulbs. She tells me last week she underwent a comprehensive carotid duplex exam at [facility] health care. I finally was able to obtain the results of this procedure which showed less than 20% diameter reduction of the left proximal internal carotid artery not felt to be hemodynamically significant. Her right internal carotid was normal. She had normal antegrade vertebral flow.  Ms. Redondo admits to a history of a heart murmur. She denies any episodes of chest pain. In 2011, she did undergo a nuclear perfusion study which revealed normal perfusion and function without scar or ischemia. An echo Doppler study done in 2011 showed normal systolic function with mild mitral regurgitation and mild tricuspid regurgitation. Presently, she denies any episodes of chest pain. She denies presyncope or syncope. Her blood pressure has been somewhat labile.   Since I last saw her, she underwent a 2-D echo Doppler study to evaluate cardiac murmur appears she was found to have normal systolic function with an ejection fraction of 60-65%. Her mitral valve is mildly thickened without significant regurgitation.  Atrial dimensions were normal. She had a normal aortic valve. There was trivial tricuspid regurgitation.  Laboratory was done which showed an LDL particle number of 1249 with a calculated LDL of 90, HDL 54 triglycerides 75 total cholesterol 159. Some resistance score was normal at 37 to she profile including liver function studies were normal. She presents for evaluation  Past Medical History  Diagnosis Date  . Sinus congestion   . 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 Hypertension   . Cancer   . Heart murmur   . GERD (gastroesophageal reflux disease)   . Bruises easily   . History of migraine headaches     Past Surgical History  Procedure Laterality Date  . Total vaginal hysterectomy      Allergies  Allergen Reactions  . Codeine Nausea Only  . Sulfa Antibiotics Nausea Only    Current Outpatient Prescriptions  Medication Sig Dispense Refill  . amoxicillin-clavulanate (AUGMENTIN) 875-125 MG per tablet as directed.      . B-D INS SYR MICROFINE 1CC/28G 28G X 1/2" 1 ML MISC Self allergy injections      . cholecalciferol (VITAMIN D) 1000 UNITS tablet Take 1,000 Units by mouth daily.      . diphenhydrAMINE (BENADRYL) 25 MG tablet Take 25 mg by mouth 1 day or 1 dose.      . estradiol (ESTRACE) 2 MG tablet Take 2 mg by mouth daily.      Marland Kitchen losartan (COZAAR) 100 MG tablet Take 100 mg by mouth daily.      Marland Kitchen  ondansetron (ZOFRAN-ODT) 4 MG disintegrating tablet as needed.      Marland Kitchen atorvastatin (LIPITOR) 40 MG tablet Take 1 tablet (40 mg total) by mouth daily.  30 tablet  0  . verapamil (CALAN-SR) 240 MG CR tablet Take 1 tablet (240 mg total) by mouth at bedtime.  30 tablet  6   No current facility-administered medications for this visit.    Social history is notable in that she is married for 38 years. She has 3 children. She works for the [employer]. She completed 3 years of college. There is no tobacco or alcohol use. She does walk approximately 3-4 days per  week.  Family History  Problem Relation Age of Onset  . Cancer Mother   . Diabetes Father   . Heart disease Father   . Cancer Sister   . Heart disease Paternal Grandmother   . Heart disease Paternal Grandfather     ROS is negative for fever chills or night sweats. She denies recent migraine headaches. She denies significant change in weight. She denies vision changes or hearing changes. She is unaware of any lymphadenopathy. She denies increasing shortness of breath. There is no cough. There is no wheezing. She denies musculoskeletal complaints. She does have thyroid nodules. She denies cold or heat intolerance. There is no diabetes. She denies chest pressure. She denies presyncope or syncope. She denies nausea vomiting or diarrhea. There is no reflux. She does have a history of hyperlipidemia. She denies change in bowel or bladder habits. She denies blood in stool or urine. She denies claudication. She denies tremors. There are no myalgias. There are no neurologic symptoms. She denies difficulty with sleep. Other comprehensive 14 point system review is negative.  PE BP 158/86  Pulse 69  Ht 5' 4"  (1.626 m)  Wt 164 lb (74.39 kg)  BMI 28.14 kg/m2 General: Alert, oriented, no distress.  Skin: normal turgor, no rashes HEENT: Normocephalic, atraumatic. Pupils round and reactive; sclera anicteric; Fundi mild increased vessel tortuosity. No hemorrhages or exudates. Nose without nasal septal hypertrophy Mouth/Parynx benign; Mallinpatti scale 2 Neck: No JVD, no carotid bruits Lungs: clear to ausculatation and percussion; no wheezing or rales Chest wall: without tenderness to palpitation Heart: RRR, s1 s2 normal 3-8/8 systolic murmur in the aortic area left sternal border Abdomen: soft, nontender; no hepatosplenomehaly, BS+; abdominal aorta nontender and not dilated by palpation. Back: no CVA tenderness Pulses 2+ Extremities: no clubbinbg cyanosis or edema, Homan's sign negative  Neurologic:  grossly nonfocal; Cranial nerves grossly wnl Psychologic: Normal mood and affect    ECG (independently read by me): Normal sinus rhythm at 69 beats per minute. No significant ST changes. Normal intervals.  Prior ECG from 07/21/2013: Normal sinus rhythm at 70 beats per minute. Normal intervals. Non specific ST changes  LABS:  BMET    Component Value Date/Time   NA 139 07/21/2013 1333   K 4.0 07/21/2013 1333   CL 104 07/21/2013 1333   CO2 23 07/21/2013 1333   GLUCOSE 78 07/21/2013 1333   BUN 11 07/21/2013 1333   CREATININE 0.85 07/21/2013 1333   CREATININE 0.95 07/11/2009 1421   CALCIUM 9.5 07/21/2013 1333   GFRNONAA >60 07/11/2009 1421   GFRAA  Value: >60        The eGFR has been calculated using the MDRD equation. This calculation has not been validated in all clinical situations. eGFR's persistently <60 mL/min signify possible Chronic Kidney Disease. 07/11/2009 1421     Hepatic Function Panel  Component Value Date/Time   PROT 7.5 07/21/2013 1333     CBC    Component Value Date/Time   WBC 6.8 07/11/2009 1421   RBC 4.01 07/11/2009 1421   HGB 12.3 07/11/2009 1421   HCT 35.8* 07/11/2009 1421   PLT 196 07/11/2009 1421   MCV 89.2 07/11/2009 1421   MCHC 34.4 07/11/2009 1421   RDW 14.0 07/11/2009 1421     BNP No results found for this basename: probnp    Lipid Panel  No results found for this basename: chol,  trig,  hdl,  cholhdl,  vldl,  ldlcalc     RADIOLOGY: No results found.   ASSESSMENT AND PLAN:  Ms. Shaneeka Scarboro is a 63 year old female with a history of hypertension, as well as family history for coronary disease in addition to hyperlipidemia. Her blood pressure today was mildly elevated a repeat blood pressure by me was 160/84. She states oftentimes her blood pressure at home is in the 130s. Presently, I am recommending further titration of her verapamil to 240 mg daily which should be helpful for occasional intermittent chest fluttering spells as well as  blood pressure. I did review her echo Doppler study which did not demonstrate significant valvular pathology. I suspect her murmur mostly more of a flow murmur. She did not have aortic stenosis or sclerosis. I reviewed her laboratory in detail. She currently is on atorvastatin 40 mg. We discussed improvement in her diet and presently I will not further increase her Lipitor presently at ideal LDL particle number is less than 1000. We discussed increased exercise. She's not having any chest pain. I will see her in 6 months for cardiology reevaluation to Troy Sine, MD, [facility] 09/01/2013 7:57 PM

## 2013-09-01 NOTE — Patient Instructions (Signed)
Your physician has recommended you make the following change in your medication: increase the verapramil  To 240 mg. This has already been sent to the pharmacy.  Your physician recommends that you schedule a follow-up appointment in:6 months.

## 2014-03-18 ENCOUNTER — Encounter: Payer: Self-pay | Admitting: Podiatrist

## 2014-03-18 ENCOUNTER — Ambulatory Visit: Payer: 59 | Admitting: Podiatrist

## 2014-03-18 ENCOUNTER — Ambulatory Visit (INDEPENDENT_AMBULATORY_CARE_PROVIDER_SITE_OTHER): Payer: 59

## 2014-03-18 VITALS — BP 148/77 | HR 67 | Resp 16

## 2014-03-18 DIAGNOSIS — M722 Plantar fascial fibromatosis: Secondary | ICD-10-CM

## 2014-03-18 MED ORDER — MELOXICAM 15 MG PO TABS: 15.0000 mg | ORAL_TABLET | Freq: Every day | ORAL | Status: DC

## 2014-03-18 MED ORDER — TRIAMCINOLONE ACETONIDE 10 MG/ML IJ SUSP
10.0000 mg | Freq: Once | INTRAMUSCULAR | Status: AC
  Administered 2014-03-18: 10 mg

## 2014-03-18 NOTE — Patient Instructions (Signed)

## 2014-03-18 NOTE — Progress Notes (Signed)
  Chief Complaint  Patient presents with  . Foot Pain    plantar heels bilateral     HPI: Patient is 63 y.o. female who presents today for a new problem in plantar heels bilateral for about 1 year. AM pain. Worse with standing still. Wearing supportive shoe and no bare feet.   Physical Exam  Patient is awake, alert, and oriented x 3.  In no acute distress.  Vascular status is intact with palpable pedal pulses at 2/4 DP and PT bilateral and capillary refill time within normal limits. Neurological sensation is also intact bilaterally via Semmes Weinstein monofilament at 5/5 sites. Light touch, vibratory sensation, Achilles tendon reflex is intact. Dermatological exam reveals skin color, turger and texture as normal. No open lesions present.  Musculature intact with dorsiflexion, plantarflexion, inversion, eversion.  Rectus foot type is noted. Pain on palpation plantar medial aspect of bilateral heels is present. No pain with tapping along the tarsal canal is present. X-rays are normal. No inferior calcaneal spurring is present.  Assessment: Under fasciitis bilateral  Plan:Discussed treatment options and at this time a plantar fascial injection was recommended.  The patient agreed and a sterile skin prep was applied.  An injection consisting of kenalog and marcaine mixture was infiltrated at the point of maximal tenderness on the bilateral Heels.  The patient tolerated this well and was given instructions for aftercare. She was dispensed stretching exercises and shoe gear changes. Discussed power step inserts. She will be seen back as needed for followup or if the symptoms do not resolve in 2-3 weeks.

## 2014-06-21 ENCOUNTER — Other Ambulatory Visit: Payer: Self-pay | Admitting: Obstetrics & Gynecology

## 2014-06-21 DIAGNOSIS — N644 Mastodynia: Secondary | ICD-10-CM

## 2014-06-21 DIAGNOSIS — N6459 Other signs and symptoms in breast: Secondary | ICD-10-CM

## 2014-06-22 ENCOUNTER — Ambulatory Visit
Admission: RE | Admit: 2014-06-22 | Discharge: 2014-06-22 | Disposition: A | Discharge status: Home / Self Care | Payer: 59 | Source: Ambulatory Visit | Attending: Obstetrics & Gynecology | Admitting: Obstetrics & Gynecology

## 2014-06-22 ENCOUNTER — Other Ambulatory Visit: Payer: Self-pay | Admitting: Obstetrics & Gynecology

## 2014-06-22 DIAGNOSIS — N6459 Other signs and symptoms in breast: Secondary | ICD-10-CM

## 2014-06-22 DIAGNOSIS — N644 Mastodynia: Secondary | ICD-10-CM

## 2014-07-12 ENCOUNTER — Ambulatory Visit
Admission: RE | Admit: 2014-07-12 | Discharge: 2014-07-12 | Disposition: A | Discharge status: Home / Self Care | Payer: 59 | Source: Ambulatory Visit | Attending: Obstetrics & Gynecology | Admitting: Obstetrics & Gynecology

## 2014-07-12 ENCOUNTER — Other Ambulatory Visit: Payer: Self-pay | Admitting: Obstetrics & Gynecology

## 2014-07-12 DIAGNOSIS — N644 Mastodynia: Secondary | ICD-10-CM

## 2014-07-12 DIAGNOSIS — N6459 Other signs and symptoms in breast: Secondary | ICD-10-CM

## 2014-07-13 ENCOUNTER — Ambulatory Visit (INDEPENDENT_AMBULATORY_CARE_PROVIDER_SITE_OTHER): Payer: 59 | Admitting: Podiatrist

## 2014-07-13 ENCOUNTER — Encounter: Payer: Self-pay | Admitting: Podiatrist

## 2014-07-13 VITALS — BP 164/92 | HR 80 | Resp 12

## 2014-07-13 DIAGNOSIS — M722 Plantar fascial fibromatosis: Secondary | ICD-10-CM

## 2014-07-20 ENCOUNTER — Other Ambulatory Visit: Payer: Self-pay | Admitting: Obstetrics & Gynecology

## 2014-07-20 DIAGNOSIS — N644 Mastodynia: Secondary | ICD-10-CM

## 2014-07-20 DIAGNOSIS — N6459 Other signs and symptoms in breast: Secondary | ICD-10-CM

## 2014-07-21 NOTE — Progress Notes (Signed)
Chief Complaint  Patient presents with  . Plantar Fasciitis    ''LT FOOT IS DOING BETTER, BUT RT FOOT STILL GET SORE SOMETIMES.''     HPI: Patient is 63 y.o. female who presents today for continued soreness on the right heel. She states the left foot was better after the injection however the right foot has continued to be bothersome. She denies any trauma or injury to the foot.   Allergies  Allergen Reactions  . Codeine Nausea Only  . Sulfa Antibiotics Nausea Only    Physical Exam  Neurovascular status is unchanged. Pain on palpation plantar medial aspect of the right heel is noted consistent with plantar fasciitis symptomatology.  Assessment: Plantar fasciitis right greater than left  Plan: Injected the right heel for injection #2. Recommended continued stretching exercises and use of supportive shoes. Discussed positive benefits of orthotic therapy as well. She'll be seen back in 4 weeks for recheck.

## 2014-07-26 ENCOUNTER — Ambulatory Visit
Admission: RE | Admit: 2014-07-26 | Discharge: 2014-07-26 | Disposition: A | Discharge status: Home / Self Care | Payer: 59 | Source: Ambulatory Visit | Attending: Obstetrics & Gynecology | Admitting: Obstetrics & Gynecology

## 2014-07-26 ENCOUNTER — Other Ambulatory Visit: Payer: Self-pay | Admitting: Obstetrics & Gynecology

## 2014-07-26 DIAGNOSIS — R599 Enlarged lymph nodes, unspecified: Secondary | ICD-10-CM

## 2014-07-26 DIAGNOSIS — N644 Mastodynia: Secondary | ICD-10-CM

## 2014-07-26 DIAGNOSIS — N6459 Other signs and symptoms in breast: Secondary | ICD-10-CM

## 2014-08-01 LAB — HM DEXA SCAN

## 2014-08-12 ENCOUNTER — Other Ambulatory Visit: Payer: Self-pay | Admitting: Surgery

## 2014-08-12 ENCOUNTER — Other Ambulatory Visit (INDEPENDENT_AMBULATORY_CARE_PROVIDER_SITE_OTHER): Payer: Self-pay | Admitting: Surgery

## 2014-09-13 ENCOUNTER — Other Ambulatory Visit: Payer: Self-pay

## 2014-09-13 MED ORDER — VERAPAMIL HCL ER 240 MG PO TBCR: 240.0000 mg | EXTENDED_RELEASE_TABLET | Freq: Every day | ORAL | Status: DC

## 2014-09-13 NOTE — Telephone Encounter (Signed)
Rx(s) sent to pharmacy electronically.  

## 2014-11-18 ENCOUNTER — Encounter: Payer: Self-pay | Admitting: Podiatrist

## 2014-11-18 ENCOUNTER — Ambulatory Visit (INDEPENDENT_AMBULATORY_CARE_PROVIDER_SITE_OTHER): Payer: 59 | Admitting: Podiatrist

## 2014-11-18 VITALS — BP 158/88 | HR 72 | Resp 12

## 2014-11-18 DIAGNOSIS — M722 Plantar fascial fibromatosis: Secondary | ICD-10-CM

## 2014-11-18 MED ORDER — NAPROXEN 500 MG PO TABS: 500.0000 mg | ORAL_TABLET | Freq: Two times a day (BID) | ORAL | Status: DC

## 2014-11-18 MED ORDER — METHYLPREDNISOLONE 4 MG PO TBPK: ORAL_TABLET | ORAL | Status: DC

## 2014-11-18 NOTE — Patient Instructions (Signed)

## 2014-11-18 NOTE — Progress Notes (Signed)
Chief Complaint  Patient presents with  . Plantar Fasciitis    "no more problems with left foot, but still having right heel"     HPI: Patient is 64 y.o. female who presents today for continued soreness on the right heel. She states the left foot was better after the injection however the right foot has continued to be bothersome. She denies any trauma or injury to the foot.   Allergies  Allergen Reactions  . Codeine Nausea Only  . Sulfa Antibiotics Nausea Only    Physical Exam  Neurovascular status is unchanged. Pain on palpation plantar medial aspect of the right heel is noted consistent with plantar fasciitis symptomatology.  Assessment: Plantar fasciitis right greater than left  Plan: Injected the right heel for injection #3. Prescription for Medrol Dosepak and naproxen sodium was also dispensed. Discussed that should her foot pain not resolve can consider boot therapy, physical therapy versus epat therapy

## 2015-01-16 ENCOUNTER — Other Ambulatory Visit: Payer: Self-pay

## 2015-01-16 MED ORDER — ATORVASTATIN CALCIUM 40 MG PO TABS: 40.0000 mg | ORAL_TABLET | Freq: Every day | ORAL | Status: DC

## 2015-01-16 MED ORDER — VERAPAMIL HCL ER 240 MG PO TBCR: 240.0000 mg | EXTENDED_RELEASE_TABLET | Freq: Every day | ORAL | Status: DC

## 2015-01-16 NOTE — Telephone Encounter (Signed)
Rx(s) sent to pharmacy electronically.  

## 2015-01-24 ENCOUNTER — Other Ambulatory Visit: Payer: Self-pay | Admitting: Physician Assistant

## 2015-01-30 ENCOUNTER — Other Ambulatory Visit: Payer: Self-pay | Admitting: Obstetrics & Gynecology

## 2015-01-31 LAB — CYTOLOGY - PAP

## 2015-03-13 ENCOUNTER — Encounter: Payer: Self-pay | Admitting: Cardiovascular Disease

## 2015-03-13 ENCOUNTER — Ambulatory Visit (INDEPENDENT_AMBULATORY_CARE_PROVIDER_SITE_OTHER): Payer: Commercial Managed Care - HMO | Admitting: Cardiovascular Disease

## 2015-03-13 VITALS — BP 132/80 | HR 73 | Ht 63.0 in | Wt 168.4 lb

## 2015-03-13 DIAGNOSIS — I10 Essential (primary) hypertension: Secondary | ICD-10-CM

## 2015-03-13 DIAGNOSIS — Z79899 Other long term (current) drug therapy: Secondary | ICD-10-CM | POA: Diagnosis not present

## 2015-03-13 DIAGNOSIS — I6521 Occlusion and stenosis of right carotid artery: Secondary | ICD-10-CM

## 2015-03-13 DIAGNOSIS — E785 Hyperlipidemia, unspecified: Secondary | ICD-10-CM | POA: Diagnosis not present

## 2015-03-13 MED ORDER — VERAPAMIL HCL ER 240 MG PO TBCR: 240.0000 mg | EXTENDED_RELEASE_TABLET | Freq: Every day | ORAL | Status: DC

## 2015-03-13 MED ORDER — LOSARTAN POTASSIUM-HCTZ 50-12.5 MG PO TABS: 1.0000 | ORAL_TABLET | Freq: Every day | ORAL | Status: DC

## 2015-03-13 NOTE — Patient Instructions (Signed)
Your physician recommends that you return for lab work fasting.   Your physician wants you to follow-up in:1 year or sooner if needed with Dr. Claiborne Billings. You will receive a reminder letter in the mail two months in advance. If you don't receive a letter, please call our office to schedule the follow-up appointment.

## 2015-03-15 ENCOUNTER — Encounter: Payer: Self-pay | Admitting: Cardiovascular Disease

## 2015-03-15 NOTE — Progress Notes (Signed)
Patient ID: Connie Cisneros, female   DOB: 03-Jun-1951, 64 y.o.   MRN: [id number]      HPI:  [name] is a 64 year old female who I initially saw through the referral of Dr. Florina Ou.  I last saw her in January 2015.  She presents for follow-up cardiology evaluation.  [name]  has a history of hypertension, with mild labile blood pressure. She  has a history of migraine headaches. Remotely she was started on losartan at 100 mg as well as verapamil 120 mg. She previously had been on losartan HCTZ. She recently underwent evaluation of possible thyroid cyst which showed a normal thyroid size and scattered small colloid cysts and partially septated cysts. There was no evidence for any solid nodules. As part of that evaluation note is made of some mild plaque formation involving the bilateral carotid bulbs. A carotid duplex exam at [facility] health care showed less than 20% diameter reduction of the left proximal internal carotid artery not felt to be hemodynamically significant. Her right internal carotid was normal. She had normal antegrade vertebral flow.  [name] admits to a history of a heart murmur. She denies any episodes of chest pain. In 2011, she did undergo a nuclear perfusion study which revealed normal perfusion and function without scar or ischemia. An echo Doppler study done in 2011 showed normal systolic function with mild mitral regurgitation and mild tricuspid regurgitation. Presently, she denies any episodes of chest pain. She denies presyncope or syncope. Her blood pressure has been somewhat labile.   A 2-D echo Doppler study in 2014 demonstrated normal systolic function with an ejection fraction of 60-65%. Her mitral valve was mildly thickened without significant regurgitation. Atrial dimensions were normal. She had a normal aortic valve. There was trivial tricuspid regurgitation.  He has a history of hyperlipidemia, and a previous NMR profile demonstrated LDL  particle number of 1249 with a calculated LDL of 90, HDL 54 triglycerides 75 total cholesterol 159. Insulin resistance score was normal at 37.  Over the past year and a half, she has continued to do well.  She does try to exercise.  She has GERD for which he takes omeprazole.  She has been taking Calan SR 240 mg at bedtime and losartan HCT 50/12.5 mg for hypertension.  She has tolerated atorvastatin 40 mg for hyperlipidemia.  She also takes meloxicam as needed for arthritic symptoms.  She did experience some vague stomach discomfort last week but this has resolved.  She denies chest pain.  She is unaware of palpitations.  She denies PND or orthopnea.  She denies difficulty with sleep.  She presents for evaluation.  Past Medical History  Diagnosis Date  . Sinus congestion   . 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 Hypertension   . Cancer   . Heart murmur   . GERD (gastroesophageal reflux disease)   . Bruises easily   . History of migraine headaches     Past Surgical History  Procedure Laterality Date  . Total vaginal hysterectomy      Allergies  Allergen Reactions  . Codeine Nausea Only  . Sulfa Antibiotics Nausea Only    Current Outpatient Prescriptions  Medication Sig Dispense Refill  . atorvastatin (LIPITOR) 40 MG tablet Take 40 mg by mouth daily.    . cholecalciferol (VITAMIN D) 1000 UNITS tablet Take 1,000 Units by mouth daily.    . diphenhydrAMINE (BENADRYL) 25 MG tablet Take  25 mg by mouth 1 day or 1 dose.    . estradiol (ESTRACE) 2 MG tablet Take 2 mg by mouth daily.    Marland Kitchen losartan-hydrochlorothiazide (HYZAAR) 50-12.5 MG per tablet Take 1 tablet by mouth daily. Take 1 tab daily 30 tablet 11  . meloxicam (MOBIC) 15 MG tablet Take 1 tablet (15 mg total) by mouth daily. 30 tablet 2  . omeprazole (PRILOSEC) 40 MG capsule Take 1 capsule by mouth daily. Take 1 cap daily  0  . VAGIFEM 10 MCG TABS vaginal tablet as needed. As needed  0  . verapamil (CALAN-SR) 240  MG CR tablet Take 1 tablet (240 mg total) by mouth at bedtime. 30 tablet 11  . atorvastatin (LIPITOR) 40 MG tablet Take 1 tablet (40 mg total) by mouth daily. MUST KEEP APPOINTMENT 03/13/15 WITH DR Claiborne Billings FOR FUTURE REFILLS 30 tablet 2   No current facility-administered medications for this visit.    Social history is notable in that she is married for 38 years. She has 3 children. She works for the [employer]. She completed 3 years of college. There is no tobacco or alcohol use. She does walk approximately 3-4 days per week.  Family History  Problem Relation Age of Onset  . Cancer Mother   . Diabetes Father   . Heart disease Father   . Cancer Sister   . Heart disease Paternal Grandmother   . Heart disease Paternal Grandfather    ROS General: Negative; No fevers, chills, or night sweats;  HEENT: Negative; No changes in vision or hearing, sinus congestion, difficulty swallowing Pulmonary: Negative; No cough, wheezing, shortness of breath, hemoptysis Cardiovascular: Negative; No chest pain, presyncope, syncope, palpitations GI: Negative; No nausea, vomiting, diarrhea, or abdominal pain GU: Negative; No dysuria, hematuria, or difficulty voiding Musculoskeletal: Negative; no myalgias, joint pain, or weakness Hematologic/Oncology: Negative; no easy bruising, bleeding Endocrine: Negative; no heat/cold intolerance; no diabetes Neuro: Positive for history of migraine headaches; no changes in balance,  Skin: Negative; No rashes or skin lesions Psychiatric: Negative; No behavioral problems, depression Sleep: Negative; No snoring, daytime sleepiness, hypersomnolence, bruxism, restless legs, hypnogognic hallucinations, no cataplexy Other comprehensive 14 point system review is negative.   PE BP 132/80 mmHg  Pulse 73  Ht 5' 3" (1.6 m)  Wt 168 lb 7 oz (76.403 kg)  BMI 29.84 kg/m2   Wt Readings from Last 3 Encounters:  03/13/15 168 lb 7 oz (76.403 kg)  09/01/13 164 lb (74.39 kg)    07/20/13 166 lb 8 oz (75.524 kg)   General: Alert, oriented, no distress.  Skin: normal turgor, no rashes HEENT: Normocephalic, atraumatic. Pupils round and reactive; sclera anicteric; Fundi mild increased vessel tortuosity. No hemorrhages or exudates. Nose without nasal septal hypertrophy Mouth/Parynx benign; Mallinpatti scale 2 Neck: No JVD, no carotid bruits Lungs: clear to ausculatation and percussion; no wheezing or rales Chest wall: without tenderness to palpitation Heart: RRR, s1 s2 normal 4-8/2 systolic murmur in the aortic area left sternal border Abdomen: soft, nontender; no hepatosplenomehaly, BS+; abdominal aorta nontender and not dilated by palpation. Back: no CVA tenderness Pulses 2+ Extremities: no clubbing cyanosis or edema, Homan's sign negative  Neurologic: grossly nonfocal; Cranial nerves grossly wnl Psychologic: Normal mood and affect  ECG (independently read by me): Normal sinus rhythm at 73 bpm.  No ectopy.  Normal intervals.  January 2015 ECG (independently read by me): Normal sinus rhythm at 69 beats per minute. No significant ST changes. Normal intervals.  Prior ECG from 07/21/2013:  Normal sinus rhythm at 70 beats per minute. Normal intervals. Non specific ST changes  LABS:  BMP Latest Ref Rng 07/21/2013 07/11/2009  Glucose 70 - 99 mg/dL 78 106(H)  BUN 6 - 23 mg/dL 11 9  Creatinine 0.50 - 1.10 mg/dL 0.85 0.95  Sodium 135 - 145 mEq/L 139 137  Potassium 3.5 - 5.3 mEq/L 4.0 4.0  Chloride 96 - 112 mEq/L 104 105  CO2 19 - 32 mEq/L 23 27  Calcium 8.4 - 10.5 mg/dL 9.5 9.2   Hepatic Function Latest Ref Rng 07/21/2013  Total Protein 6.0 - 8.3 g/dL 7.5  Albumin 3.5 - 5.2 g/dL 3.9  AST 0 - 37 U/L 15  ALT 0 - 35 U/L 13  Alk Phosphatase 39 - 117 U/L 89  Total Bilirubin 0.3 - 1.2 mg/dL 0.5   CBC Latest Ref Rng 07/11/2009  WBC 4.0 - 10.5 K/uL 6.8  Hemoglobin 12.0 - 15.0 g/dL 12.3  Hematocrit 36.0 - 46.0 % 35.8(L)  Platelets 150 - 400 K/uL 196   Lab Results   Component Value Date   MCV 89.2 07/11/2009   No results found for: TSH No results found for: HGBA1C  Lipid Panel     Component Value Date/Time   CHOL 159 07/21/2013 1336   TRIG 75 07/21/2013 1336   HDL 54 07/21/2013 1336   LDLCALC 90 07/21/2013 1336    RADIOLOGY: No results found.   ASSESSMENT AND PLAN:  [name] is a 64 year old female with a history of hypertension,hyperlipidemia and has a family history for coronary disease.  In the past, she has had some blood pressure lability.  Her blood pressure today is stable on her current dose of Calan SR 240 mg and losartan HCT 50/12.5 mg.  Her rhythm is stable.  I again reviewed her echo Doppler study from December 2014 which revealed normal LV function.  There was mild mitral valve thickening without significant regurgitation.  There was trivial tricuspid regurgitation.  She continues to tolerate atorvastatin 40 mg with reference to her hyperlipidemia.  She has not had recent laboratory and I have recommended a complete set of blood work be obtained.  She has been stable with reference to her migraine headaches.  She has GERD for which he takes omeprazole.  I will contact her regarding her blood work once available and adjustments to her medication will be made if necessary.  Long as she remains stable, I will see her one year for reevaluation.    Time spent: 25 minutes  Troy Sine, MD, [facility] 03/15/2015 7:31 PM

## 2015-03-17 LAB — CBC
HCT: 34.1 % — ABNORMAL LOW (ref 36.0–46.0)
Hemoglobin: 11 g/dL — ABNORMAL LOW (ref 12.0–15.0)
MCH: 29 pg (ref 26.0–34.0)
MCHC: 32.3 g/dL (ref 30.0–36.0)
MCV: 90 fL (ref 78.0–100.0)
MPV: 11.4 fL (ref 8.6–12.4)
Platelets: 255 10*3/uL (ref 150–400)
RBC: 3.79 MIL/uL — ABNORMAL LOW (ref 3.87–5.11)
RDW: 16.2 % — ABNORMAL HIGH (ref 11.5–15.5)
WBC: 4.2 10*3/uL (ref 4.0–10.5)

## 2015-03-17 LAB — COMPREHENSIVE METABOLIC PANEL
ALT: 18 U/L (ref 6–29)
AST: 24 U/L (ref 10–35)
Albumin: 3.6 g/dL (ref 3.6–5.1)
Alkaline Phosphatase: 77 U/L (ref 33–130)
BUN: 13 mg/dL (ref 7–25)
CO2: 25 mmol/L (ref 20–31)
Calcium: 8.9 mg/dL (ref 8.6–10.4)
Chloride: 102 mmol/L (ref 98–110)
Creat: 0.97 mg/dL (ref 0.50–0.99)
Glucose, Bld: 92 mg/dL (ref 65–99)
Potassium: 4 mmol/L (ref 3.5–5.3)
Sodium: 138 mmol/L (ref 135–146)
Total Bilirubin: 0.5 mg/dL (ref 0.2–1.2)
Total Protein: 7.4 g/dL (ref 6.1–8.1)

## 2015-03-17 LAB — LIPID PANEL
Cholesterol: 150 mg/dL (ref 125–200)
HDL: 37 mg/dL — ABNORMAL LOW (ref >=46)
LDL Cholesterol: 76 mg/dL (ref <130)
Total CHOL/HDL Ratio: 4.1 Ratio (ref <=5.0)
Triglycerides: 183 mg/dL — ABNORMAL HIGH (ref <150)
VLDL: 37 mg/dL — ABNORMAL HIGH (ref <30)

## 2015-03-17 LAB — TSH: TSH: 1.59 u[IU]/mL (ref 0.350–4.500)

## 2015-05-01 ENCOUNTER — Other Ambulatory Visit: Payer: Self-pay | Admitting: Cardiovascular Disease

## 2015-05-02 NOTE — Telephone Encounter (Signed)
Rx request sent to pharmacy.  

## 2015-07-25 ENCOUNTER — Ambulatory Visit (INDEPENDENT_AMBULATORY_CARE_PROVIDER_SITE_OTHER): Payer: 59 | Admitting: Podiatry

## 2015-07-25 ENCOUNTER — Encounter: Payer: Self-pay | Admitting: Podiatry

## 2015-07-25 VITALS — BP 154/60 | HR 76 | Resp 16

## 2015-07-25 DIAGNOSIS — M722 Plantar fascial fibromatosis: Secondary | ICD-10-CM | POA: Diagnosis not present

## 2015-07-25 MED ORDER — METHYLPREDNISOLONE 4 MG PO TBPK: ORAL_TABLET | ORAL | Status: DC

## 2015-07-25 NOTE — Progress Notes (Signed)
She presents today for a follow-up of plantar fasciitis. She states that her heels started bothering her again and would like to consider another set of injections if possible. She denies any changes in her past medical history medications allergy surgeries or social history.  Objective: Vital signs stable alert and oriented 3. Pulses are strongly palpable. She has pain on palpation medial calcaneal tubercles bilateral.  Assessment: Chronic intractable plantar fasciitis bilateral.  Plan: Discussed the need for orthotics. Injected the bilateral heels with 20 mg of Kenalog each end dispensed a prescription for a Medrol Dosepak. I will follow-up with her as needed.

## 2015-11-09 ENCOUNTER — Other Ambulatory Visit: Payer: Self-pay | Admitting: Obstetrics & Gynecology

## 2015-11-09 DIAGNOSIS — N644 Mastodynia: Secondary | ICD-10-CM

## 2015-11-14 ENCOUNTER — Ambulatory Visit
Admission: RE | Admit: 2015-11-14 | Discharge: 2015-11-14 | Disposition: A | Discharge status: Home / Self Care | Payer: Commercial Managed Care - HMO | Source: Ambulatory Visit | Attending: Obstetrics & Gynecology | Admitting: Obstetrics & Gynecology

## 2015-11-14 DIAGNOSIS — N644 Mastodynia: Secondary | ICD-10-CM

## 2016-02-22 ENCOUNTER — Other Ambulatory Visit: Payer: Self-pay | Admitting: Physician Assistant

## 2016-03-21 ENCOUNTER — Other Ambulatory Visit: Payer: Self-pay | Admitting: Physician Assistant

## 2016-03-22 ENCOUNTER — Telehealth: Payer: Self-pay | Admitting: Cardiovascular Disease

## 2016-03-22 NOTE — Telephone Encounter (Signed)
Returned call to patient no answer.LMTC. 

## 2016-03-22 NOTE — Telephone Encounter (Signed)
New message  Pt c/o medication issue:  1. Name of Medication: Losartan........Marland Kitchen Verapamil   2. How are you currently taking this medication (dosage and times per day)? 50-12.5mg ......... 240mg   3. Are you having a reaction (difficulty breathing--STAT)? Lips swelling.. Tongue swelling.. Sore mouth and neck... glands swelling   4. What is your medication issue? Pt call requesting to speak with RN. Pt states the pharmacist instructed her to call and see if a med could be switch.

## 2016-03-22 NOTE — Telephone Encounter (Signed)
Returned call to patient.She stated she went to Urgent Care this morning for mouth and tongue swelling.Stated Verapamil was stopped.She was told to see Dr.Kelly.Advised Dr.Kelly out of office.Appointment scheduled with Almyra Deforest PA Mon 03/25/16 at 2:00 pm.

## 2016-03-24 NOTE — Progress Notes (Signed)
Cardiology Office Note    Date:  03/25/2016   ID:  Connie Cisneros, DOB [birth year], MRN [id number]  PCP:  Odette Fraction, MD  Cardiologist:  Dr. Claiborne Billings   Chief Complaint  Patient presents with  . Follow-up    seen for Dr. Claiborne Billings, swelling of tongue, reaction with Verapmil    History of Present Illness:  Connie Cisneros is a 65 y.o. female with past medical history of hypertension, heart murmur, history of right lower extremity DVT and history of migraine headaches. She has been on losartan/HCTZ and verapamil for many years. She had previous evaluation of possible thyroid cyst which showed a normal thyroid size, scattered small colloid cyst and partially septated cysts. There was no evidence of any solid nodules. As part of the evaluation, it was noted that she had some mild plaque formation involving bilateral carotid bulbs. Duplex obtained at [facility] showed less than 20% diameter reduction of left proximal internal carotid artery which was not significant, normal right ICA. She did have a history of heart murmur. She also had normal perfusion study in 2011. She had a 2-D echocardiogram in 2014 demonstrating normal systolic function with ejection fraction 60-65% without significant valvular issues. She was last seen in the office on 03/13/2015 at which time she was stable. One-year follow-up was recommended.  Patient apparently presented to the urgent care on 03/22/2016 with swelling of mouth and tongue. According to the patient, she was diagnosed with thrush, because she has swelling on the side of the face including the tongue and also pain in the throat, it was felt she likely had angioedema as well. She was treated with for thrush with swish and swallow. She was also treated for angioedema with Benadryl, famotidine, and also a course of steroid taper which she just finished today. Her verapamil was stopped as urgent care doctor thought it was likely the main culprit. She  presents today for cardiology follow-up. She has not taken any verapamil since last Friday, her last dose of Hyzaar (losartan/HCTZ) was this morning. Her swelling has significantly improved esp with the steroid. Other than this episode, she has been doing very well in the last year. She has no other acute complaints. As for her Lipitor 40 mg daily, looking through previous record, she was prescribed in 2016, however patient states she has never filled it. I will refill her Lipitor today. She will obtain a fasting lipid panel and LFTs in 2 months. We'll also obtain CBC and basic metabolic panel to establish baseline.    Past Medical History:  Diagnosis Date  . Bruises easily   . Cancer ([place])   . 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 GERD (gastroesophageal reflux disease)   . Heart murmur   . History of migraine headaches   . Hypertension   . Sinus congestion     Past Surgical History:  Procedure Laterality Date  . TOTAL VAGINAL HYSTERECTOMY      Current Medications: Outpatient Medications Prior to Visit  Medication Sig Dispense Refill  . cholecalciferol (VITAMIN D) 1000 UNITS tablet Take 1,000 Units by mouth daily.    . diphenhydrAMINE (BENADRYL) 25 MG tablet Take 25 mg by mouth 1 day or 1 dose.    . estradiol (ESTRACE) 2 MG tablet Take 2 mg by mouth daily.    . meloxicam (MOBIC) 15 MG tablet Take 1 tablet (15 mg total) by mouth daily. 30 tablet 2  .  methylPREDNISolone (MEDROL) 4 MG TBPK tablet Tapering 6 day dose pack 21 tablet 0  . omeprazole (PRILOSEC) 40 MG capsule Take 1 capsule by mouth daily. Take 1 cap daily  0  . VAGIFEM 10 MCG TABS vaginal tablet as needed. As needed  0  . atorvastatin (LIPITOR) 40 MG tablet take 1 tablet by mouth once daily 30 tablet 10  . losartan-hydrochlorothiazide (HYZAAR) 50-12.5 MG per tablet Take 1 tablet by mouth daily. Take 1 tab daily 30 tablet 11  . verapamil (CALAN-SR) 240 MG CR tablet Take 1 tablet (240 mg total) by  mouth at bedtime. 30 tablet 11   No facility-administered medications prior to visit.      Allergies:   Losartan; Verapamil; Codeine; Lisinopril; and Sulfa antibiotics   Social History   Social History  . Marital status: Married    Spouse name: N/A  . Number of children: N/A  . Years of education: N/A   Social History Main Topics  . Smoking status: Never Smoker  . Smokeless tobacco: None  . Alcohol use No  . Drug use: No  . Sexual activity: Not Asked   Other Topics Concern  . None   Social History Narrative  . None     Family History:  The patient's family history includes Cancer in her mother and sister; Diabetes in her father; Heart disease in her father, paternal grandfather, and paternal grandmother.   ROS:   Please see the history of present illness.    ROS All other systems reviewed and are negative.   PHYSICAL EXAM:   VS:  BP 134/76 (BP Location: Left Arm, Patient Position: Sitting, Cuff Size: Normal)   Pulse 72   Ht 5' 3.5" (1.613 m)   Wt 171 lb 8 oz (77.8 kg)   BMI 29.90 kg/m    GEN: Well nourished, well developed, in no acute distress  HEENT: normal  Neck: no JVD, carotid bruits, or masses Cardiac: RRR; no rubs, or gallops,no edema  [id number] systolic murmur Respiratory:  clear to auscultation bilaterally, normal work of breathing GI: soft, nontender, nondistended, + BS MS: no deformity or atrophy  Skin: warm and dry, no rash Neuro:  Alert and Oriented x 3, Strength and sensation are intact Psych: euthymic mood, full affect  Wt Readings from Last 3 Encounters:  03/25/16 171 lb 8 oz (77.8 kg)  03/13/15 168 lb 7 oz (76.4 kg)  09/01/13 164 lb (74.4 kg)      Studies/Labs Reviewed:   EKG:  EKG is ordered today.  The ekg ordered today demonstrates NSR without significant ST-T wave changes  Recent Labs: No results found for requested labs within last 8760 hours.   Lipid Panel    Component Value Date/Time   CHOL 150 03/17/2015 0913   CHOL 159  07/21/2013 1336   TRIG 183 (H) 03/17/2015 0913   TRIG 75 07/21/2013 1336   HDL 37 (L) 03/17/2015 0913   HDL 54 07/21/2013 1336   CHOLHDL 4.1 03/17/2015 0913   VLDL 37 (H) 03/17/2015 0913   LDLCALC 76 03/17/2015 0913   LDLCALC 90 07/21/2013 1336    Additional studies/ records that were reviewed today include:   Echo 08/03/2013 LV EF: 60% -  65%  ------------------------------------------------------------ Indications:   Hypertension - benign 401.1.  ------------------------------------------------------------ History:  PMH: Gerd, Migraines Murmur.  ------------------------------------------------------------ Study Conclusions  - Left ventricle: The cavity size was normal. Wall thickness was normal. Systolic function was normal. The estimated ejection fraction was in the range of  60% to 65%. Wall motion was normal; there were no regional wall motion abnormalities. Left ventricular diastolic function parameters were normal. - Mitral valve: Mildly thickened leaflets . No significant regurgitation. - Left atrium: LA Volume/ BSA = 21.5 ml/m2 The atrium was normal in size. - Atrial septum: No defect or patent foramen ovale was identified. - Inferior vena cava: The vessel was normal in size; the respirophasic diameter changes were in the normal range (= 50%); findings are consistent with normal central venous pressure. - Pericardium, extracardiac: There was no pericardial effusion.     ASSESSMENT:    1. Angioedema, subsequent encounter   2. Hyperlipidemia   3. Essential hypertension      PLAN:  In order of problems listed above:  1. Facial swelling: Finished last dose of tapering dose of steroids today, her swelling has significantly improved. Also has been taking swish and swallow for presumed thrush. Although severe esophagitis may mimic her symptom, we can only assume it is angioedema as it responded to steroid. Fortunately, she did  not have any respiratory compromise. Her verapamil was stopped as the primary culprit, however discussing with our clinical pharmacist, I think the likely culprit is losartan. Will stop hyzaar, will change to HCTZ 12.5mg  daily and Coreg 6.25mg  BID 2. Heart murmur: 1/6 systolic murmur at RUSB, however does not appear to have significant valvular issue on echo 2014 3. HTN: BP mildly elevated today, will have patient come back in 2 weeks to be seen in HTN clinic by clinical pharmacist. Otherwise, stable from cardiac perspective, will see back in 1 year. 4. Hyperlipidemia: Elevated triglycerides in 2016, prescribed Lipitor, however never filled it, we'll refill Lipitor. Obtain fasting lipid panel and LFTs in 2 month. Obtain CBC and basic metabolic panel.   Medication Adjustments/Labs and Tests Ordered: Current medicines are reviewed at length with the patient today.  Concerns regarding medicines are outlined above.  Medication changes, Labs and Tests ordered today are listed in the Patient Instructions below. Patient Instructions  Medication Instructions:  Your physician has recommended you make the following change in your medication:  1) STOP Verapamil 2) STOP Losartan HCTZ 3) START Carvedilol 6.25mg  twice daily. An Rx has been sent to your pharmacy 4) START HCTZ 12.5mg  daily. An Rx has been sent to your pharmacy   Labwork: Your physician recommends that you return for a FASTING lipid profile, lft, cbc, bmet   Testing/Procedures: None ordered  Follow-Up: Your physician recommends that you schedule a follow-up appointment in: 2-3 weeks with the pharmacist in the Hypertension clinic  Your physician wants you to follow-up in: 1 year with [facility] You will receive a reminder letter in the mail two months in advance. If you don't receive a letter, please call our office to schedule the follow-up appointment.     Any Other Special Instructions Will Be Listed Below (If Applicable).     If  you need a refill on your cardiac medications before your next appointment, please call your pharmacy.      Hilbert Corrigan, [place]  03/25/2016 6:04 PM    [facility] [address], [place], [place]  [address] Phone: [phone]; Fax: [phone]

## 2016-03-25 ENCOUNTER — Encounter: Payer: Self-pay | Admitting: Physician Assistant

## 2016-03-25 ENCOUNTER — Ambulatory Visit (INDEPENDENT_AMBULATORY_CARE_PROVIDER_SITE_OTHER): Payer: Commercial Managed Care - HMO | Admitting: Physician Assistant

## 2016-03-25 VITALS — BP 134/76 | HR 72 | Ht 63.5 in | Wt 171.5 lb

## 2016-03-25 DIAGNOSIS — T783XXD Angioneurotic edema, subsequent encounter: Secondary | ICD-10-CM | POA: Diagnosis not present

## 2016-03-25 DIAGNOSIS — I10 Essential (primary) hypertension: Secondary | ICD-10-CM

## 2016-03-25 DIAGNOSIS — R011 Cardiac murmur, unspecified: Secondary | ICD-10-CM

## 2016-03-25 DIAGNOSIS — E785 Hyperlipidemia, unspecified: Secondary | ICD-10-CM | POA: Diagnosis not present

## 2016-03-25 MED ORDER — ATORVASTATIN CALCIUM 40 MG PO TABS: 40.0000 mg | ORAL_TABLET | Freq: Every day | ORAL | 3 refills | Status: DC

## 2016-03-25 MED ORDER — CARVEDILOL 6.25 MG PO TABS: 6.2500 mg | ORAL_TABLET | Freq: Two times a day (BID) | ORAL | 3 refills | Status: DC

## 2016-03-25 MED ORDER — HYDROCHLOROTHIAZIDE 12.5 MG PO CAPS: 12.5000 mg | ORAL_CAPSULE | Freq: Every day | ORAL | 3 refills | Status: DC

## 2016-03-25 NOTE — Patient Instructions (Signed)
Medication Instructions:  Your physician has recommended you make the following change in your medication:  1) STOP Verapamil 2) STOP Losartan HCTZ 3) START Carvedilol 6.25mg  twice daily. An Rx has been sent to your pharmacy 4) START HCTZ 12.5mg  daily. An Rx has been sent to your pharmacy   Labwork: Your physician recommends that you return for a FASTING lipid profile, lft, cbc, bmet   Testing/Procedures: None ordered  Follow-Up: Your physician recommends that you schedule a follow-up appointment in: 2-3 weeks with the pharmacist in the Hypertension clinic  Your physician wants you to follow-up in: 1 year with [facility] You will receive a reminder letter in the mail two months in advance. If you don't receive a letter, please call our office to schedule the follow-up appointment.     Any Other Special Instructions Will Be Listed Below (If Applicable).     If you need a refill on your cardiac medications before your next appointment, please call your pharmacy.

## 2016-04-10 ENCOUNTER — Ambulatory Visit (INDEPENDENT_AMBULATORY_CARE_PROVIDER_SITE_OTHER): Payer: Commercial Managed Care - HMO | Admitting: Pharmacist

## 2016-04-10 VITALS — BP 138/82 | HR 71

## 2016-04-10 DIAGNOSIS — I10 Essential (primary) hypertension: Secondary | ICD-10-CM | POA: Diagnosis not present

## 2016-04-10 NOTE — Patient Instructions (Addendum)
Return for a follow up appointment with Dr. Claiborne Billings as scheduled  Check your blood pressure at home daily (if able) and keep record of the readings.  Take your BP meds as follows: Continue same medications   Please call [phone] if you continue to remain tired in a few weeks.    Bring all of your meds, your BP cuff and your record of home blood pressures to your next appointment.  Exercise as you're able, try to walk approximately 30 minutes per day.  Keep salt intake to a minimum, especially watch canned and prepared boxed foods.  Eat more fresh fruits and vegetables and fewer canned items.  Avoid eating in fast food restaurants.    HOW TO TAKE YOUR BLOOD PRESSURE: . Rest 5 minutes before taking your blood pressure. .  Don't smoke or drink caffeinated beverages for at least 30 minutes before. . Take your blood pressure before (not after) you eat. . Sit comfortably with your back supported and both feet on the floor (don't cross your legs). . Elevate your arm to heart level on a table or a desk. . Use the proper sized cuff. It should fit smoothly and snugly around your bare upper arm. There should be enough room to slip a fingertip under the cuff. The bottom edge of the cuff should be 1 inch above the crease of the elbow. . Ideally, take 3 measurements at one sitting and record the average.

## 2016-04-10 NOTE — Progress Notes (Signed)
Patient ID: Connie Cisneros                 DOB: 05-23-1951                      MRN: [id number]     HPI: Connie Cisneros is a 65 y.o. female patient of Dr. Claiborne Billings with [facility] who presents today for hypertension evaluation. She was recently seen by Almyra Deforest, PA. At this visit her blood pressure medications were changed due to a reaction that was thought to be angioedema.   She has been feeling well since these changes. She does endorse being a little more lethargic over the last two weeks. She also reports occasionally having orthostatic symptoms, though she states these symptoms are not recent and have not worsened at all.   Cardiac Hx: HTN, Carotid artery plaque, HLD  Current HTN meds:  Carvedilol 6.25mg  BID HCTZ 12.5mg  daily  Previously tried: verapamil, losartan - had an angioedema like reaction which was attributed to verapamil, but both meds discontinued as precaution   BP goal: <150/90  Family History: She reports that she has no significant family history.   Social History: Never tobacco. Denies alcohol.   Diet: She eats most of her meals from home. She does occasionally eat fast food for lunch. She also endorses adding salt to some of her food. She drinks 1 cup of coffee. She also endorses sweet tea about 2-3 times per week.   Exercise: No formal exercise, but does walk for her 15 minute break if it is nice at work.   Home BP readings: She has an arm cuff but does not use it.   Wt Readings from Last 3 Encounters:  03/25/16 171 lb 8 oz (77.8 kg)  03/13/15 168 lb 7 oz (76.4 kg)  09/01/13 164 lb (74.4 kg)   BP Readings from Last 3 Encounters:  04/10/16 138/82  03/25/16 134/76  07/25/15 (!) 154/60   Pulse Readings from Last 3 Encounters:  04/10/16 71  03/25/16 72  07/25/15 76    Renal function: CrCl cannot be calculated (Unknown ideal weight.).  Past Medical History:  Diagnosis Date  . Bruises easily   . Cancer ([place])   . 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 GERD (gastroesophageal reflux disease)   . Heart murmur   . History of migraine headaches   . Hypertension   . Sinus congestion     Current Outpatient Prescriptions on File Prior to Visit  Medication Sig Dispense Refill  . atorvastatin (LIPITOR) 40 MG tablet Take 1 tablet (40 mg total) by mouth daily. 90 tablet 3  . carvedilol (COREG) 6.25 MG tablet Take 1 tablet (6.25 mg total) by mouth 2 (two) times daily. 180 tablet 3  . cholecalciferol (VITAMIN D) 1000 UNITS tablet Take 1,000 Units by mouth daily.    . diphenhydrAMINE (BENADRYL) 25 MG tablet Take 25 mg by mouth 1 day or 1 dose.    . estradiol (ESTRACE) 2 MG tablet Take 2 mg by mouth daily.    . hydrochlorothiazide (MICROZIDE) 12.5 MG capsule Take 1 capsule (12.5 mg total) by mouth daily. 90 capsule 3  . naproxen (NAPROSYN) 500 MG tablet Take 500 mg by mouth daily as needed.    Marland Kitchen omeprazole (PRILOSEC) 40 MG capsule Take 1 capsule by mouth daily. Take 1 cap daily  0  . tiZANidine (ZANAFLEX) 4 MG tablet TAKE 1/2 TO  1 TABLET BY MOUTH TWICE A DAY AS NEEDED FOR SPASMS  0  . VAGIFEM 10 MCG TABS vaginal tablet as needed. As needed  0   No current facility-administered medications on file prior to visit.     Allergies  Allergen Reactions  . Losartan     Unclear if it was losartan or verapamil that caused angioedema, both are stopped  . Verapamil     Stopped by urgent care in 03/22/2016 for possible angioedema, losartan was stopped on 03/25/16 by cardiology as it is a more likely culprit  . Codeine Nausea Only  . Lisinopril Other (See Comments)    Sleepy and tired  . Sulfa Antibiotics Nausea Only    Blood pressure 138/82, pulse 71.   Assessment/Plan: Hypertension: BP today is at goal. Continue current medications at current doses. Call clinic if lethargy does not improve over the next few weeks. Labs as planned. Follow up with Dr. Claiborne Billings as directed.   Thank you, Lelan Pons. Patterson Hammersmith, [facility]  04/10/2016 4:30 PM

## 2016-05-28 ENCOUNTER — Other Ambulatory Visit: Payer: Self-pay | Admitting: *Deleted

## 2016-05-28 DIAGNOSIS — E785 Hyperlipidemia, unspecified: Secondary | ICD-10-CM

## 2016-05-28 DIAGNOSIS — I10 Essential (primary) hypertension: Secondary | ICD-10-CM

## 2016-05-28 LAB — BASIC METABOLIC PANEL
BUN: 12 mg/dL (ref 7–25)
CO2: 28 mmol/L (ref 20–31)
Calcium: 9 mg/dL (ref 8.6–10.4)
Chloride: 102 mmol/L (ref 98–110)
Creat: 0.82 mg/dL (ref 0.50–0.99)
Glucose, Bld: 95 mg/dL (ref 65–99)
Potassium: 3.6 mmol/L (ref 3.5–5.3)
Sodium: 138 mmol/L (ref 135–146)

## 2016-05-28 LAB — CBC
HCT: 36 % (ref 35.0–45.0)
Hemoglobin: 11.8 g/dL (ref 11.7–15.5)
MCH: 29.4 pg (ref 27.0–33.0)
MCHC: 32.8 g/dL (ref 32.0–36.0)
MCV: 89.8 fL (ref 80.0–100.0)
MPV: 12.3 fL (ref 7.5–12.5)
Platelets: 198 10*3/uL (ref 140–400)
RBC: 4.01 MIL/uL (ref 3.80–5.10)
RDW: 14.7 % (ref 11.0–15.0)
WBC: 3.7 10*3/uL — ABNORMAL LOW (ref 3.8–10.8)

## 2016-05-28 LAB — LIPID PANEL
Cholesterol: 149 mg/dL (ref 125–200)
HDL: 53 mg/dL (ref >=46)
LDL Cholesterol: 72 mg/dL (ref <130)
Total CHOL/HDL Ratio: 2.8 Ratio (ref <=5.0)
Triglycerides: 118 mg/dL (ref <150)
VLDL: 24 mg/dL (ref <30)

## 2016-05-28 LAB — HEPATIC FUNCTION PANEL
ALT: 13 U/L (ref 6–29)
AST: 16 U/L (ref 10–35)
Albumin: 3.8 g/dL (ref 3.6–5.1)
Alkaline Phosphatase: 73 U/L (ref 33–130)
Bilirubin, Direct: 0.1 mg/dL (ref <=0.2)
Indirect Bilirubin: 0.4 mg/dL (ref 0.2–1.2)
Total Bilirubin: 0.5 mg/dL (ref 0.2–1.2)
Total Protein: 7 g/dL (ref 6.1–8.1)

## 2016-06-24 ENCOUNTER — Ambulatory Visit (INDEPENDENT_AMBULATORY_CARE_PROVIDER_SITE_OTHER): Payer: Commercial Managed Care - HMO | Admitting: Podiatry

## 2016-06-24 ENCOUNTER — Encounter: Payer: Self-pay | Admitting: Podiatry

## 2016-06-24 DIAGNOSIS — M722 Plantar fascial fibromatosis: Secondary | ICD-10-CM

## 2016-06-24 NOTE — Progress Notes (Signed)
Subjective: 65 year old female presents the also concerns of left heel pain which has been ongoing for the last several weeks. She states that she has chronic plantar fasciitis and both feet and this time he is started become aggravated she is requesting an injection the left foot. She denies any recent injury or trauma. No numbness or tingling. The pain does not wake her up in the. Denies any systemic complaints such as fevers, chills, nausea, vomiting. No acute changes since last appointment, and no other complaints at this time.   Objective: AAO x3, NAD DP/PT pulses palpable bilaterally, CRT less than 3 seconds Tenderness to palpation along the plantar medial tubercle of the calcaneus at the insertion of plantar fascia on the left foot. There is no pain along the course of the plantar fascia within the arch of the foot. Plantar fascia appears to be intact. There is no pain with lateral compression of the calcaneus or pain with vibratory sensation. There is no pain along the course or insertion of the achilles tendon. No other areas of tenderness to bilateral lower extremities. No open lesions or pre-ulcerative lesions.  No pain with calf compression, swelling, warmth, erythema  Assessment: Left heel pain, plantar fasciitis  Plan: -All treatment options discussed with the patient including all alternatives, risks, complications.  -Declined new x-rays -Patient elects to proceed with steroid injection into the left heel. Under sterile skin preparation, a total of 2.5cc of kenalog 10, 0.5% Marcaine plain, and 2% lidocaine plain were infiltrated into the symptomatic area without complication. A band-aid was applied. Patient tolerated the injection well without complication. Post-injection care with discussed with the patient. Discussed with the patient to ice the area over the next couple of days to help prevent a steroid flare.  -She has naproxen at home and to continue with this -Plantar fascial  brace -Stretching, ice and exercises daily. -Patient encouraged to call the office with any questions, concerns, change in symptoms.   *x-ray next appointment if symptoms continue   Celesta Gentile, DPM

## 2016-06-24 NOTE — Patient Instructions (Signed)

## 2016-11-08 ENCOUNTER — Emergency Department (HOSPITAL_COMMUNITY): Payer: Commercial Managed Care - HMO

## 2016-11-08 ENCOUNTER — Emergency Department (HOSPITAL_COMMUNITY)
Admission: EM | Admit: 2016-11-08 | Discharge: 2016-11-08 | Disposition: A | Discharge status: Home / Self Care | Payer: Commercial Managed Care - HMO | Attending: Emergency Medicine | Admitting: Emergency Medicine

## 2016-11-08 ENCOUNTER — Encounter (HOSPITAL_COMMUNITY): Payer: Self-pay | Admitting: Emergency Medicine

## 2016-11-08 DIAGNOSIS — Z859 Personal history of malignant neoplasm, unspecified: Secondary | ICD-10-CM | POA: Insufficient documentation

## 2016-11-08 DIAGNOSIS — M79602 Pain in left arm: Secondary | ICD-10-CM | POA: Diagnosis not present

## 2016-11-08 DIAGNOSIS — I10 Essential (primary) hypertension: Secondary | ICD-10-CM | POA: Diagnosis not present

## 2016-11-08 DIAGNOSIS — Z79899 Other long term (current) drug therapy: Secondary | ICD-10-CM | POA: Insufficient documentation

## 2016-11-08 LAB — CBC
HCT: 38.2 % (ref 36.0–46.0)
Hemoglobin: 12.4 g/dL (ref 12.0–15.0)
MCH: 29.1 pg (ref 26.0–34.0)
MCHC: 32.5 g/dL (ref 30.0–36.0)
MCV: 89.7 fL (ref 78.0–100.0)
Platelets: 237 10*3/uL (ref 150–400)
RBC: 4.26 MIL/uL (ref 3.87–5.11)
RDW: 13.5 % (ref 11.5–15.5)
WBC: 4.5 10*3/uL (ref 4.0–10.5)

## 2016-11-08 LAB — BASIC METABOLIC PANEL
Anion gap: 12 (ref 5–15)
BUN: 11 mg/dL (ref 6–20)
CO2: 23 mmol/L (ref 22–32)
Calcium: 9.2 mg/dL (ref 8.9–10.3)
Chloride: 101 mmol/L (ref 101–111)
Creatinine, Ser: 0.86 mg/dL (ref 0.44–1.00)
GFR calc Af Amer: >60 mL/min (ref >60)
GFR calc non Af Amer: >60 mL/min (ref >60)
Glucose, Bld: 86 mg/dL (ref 65–99)
Potassium: 3.6 mmol/L (ref 3.5–5.1)
Sodium: 136 mmol/L (ref 135–145)

## 2016-11-08 LAB — I-STAT TROPONIN, ED
Troponin i, poc: 0 ng/mL (ref 0.00–0.08)
Troponin i, poc: 0 ng/mL (ref 0.00–0.08)

## 2016-11-08 MED ORDER — NAPROXEN 500 MG PO TABS: 500.0000 mg | ORAL_TABLET | Freq: Two times a day (BID) | ORAL | 0 refills | Status: DC

## 2016-11-08 NOTE — ED Triage Notes (Signed)
Pt sent here from [facility] for further eval of left arm pain x 2 days

## 2016-11-08 NOTE — ED Notes (Signed)
Pt and husband states they understand instructions. Home stable with steady gait.

## 2016-11-08 NOTE — ED Notes (Signed)
ED Provider at bedside. 

## 2016-11-08 NOTE — ED Provider Notes (Signed)
[facility] DEPT Provider Note   CSN: [id number] Arrival date & time: 11/08/16  1301     History   Chief Complaint Chief Complaint  Patient presents with  . Arm Pain    HPI LORIEN SHINGLER is a 66 y.o. female.  HPI Pt started having pain in her left arm yesterday.  No change with moving of the arm.   Worse with lyhing on the side. No CP or SOb.  It was hurting a lot when she went to bed but she was able to sleep.  It was still there in the am but not as bad.  It got worse as the day progressed.  Her doctor told her to go to an urgent care bc they were booked up.  She went to an urgent care today.  They wanted her to come to the ED for blood testing and further evaluation. Past Medical History:  Diagnosis Date  . Bruises easily   . Cancer ([place])   . 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 GERD (gastroesophageal reflux disease)   . Heart murmur   . History of migraine headaches   . Hypertension   . Sinus congestion     Patient Active Problem List   Diagnosis Date Noted  . Plantar fasciitis, left 06/24/2016  . HTN (hypertension) 07/20/2013  . Migraine headache 07/20/2013  . 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 Hyperlipidemia 07/20/2013  . Carotid artery plaque 07/20/2013    Past Surgical History:  Procedure Laterality Date  . TOTAL VAGINAL HYSTERECTOMY      OB History    No data available       Home Medications    Prior to Admission medications   Medication Sig Start Date End Date Taking? Authorizing Provider  atorvastatin (LIPITOR) 40 MG tablet Take 1 tablet (40 mg total) by mouth daily. 03/25/16  Yes Almyra Deforest, PA  carvedilol (COREG) 6.25 MG tablet Take 1 tablet (6.25 mg total) by mouth 2 (two) times daily. 03/25/16  Yes Almyra Deforest, PA  diphenhydrAMINE (BENADRYL) 25 MG tablet Take 25 mg by mouth daily as needed for allergies.    Yes Historical Provider, MD  estradiol (ESTRACE) 2 MG tablet Take 2 mg by mouth daily.   Yes Historical Provider, MD    fexofenadine (ALLEGRA) 180 MG tablet Take 180 mg by mouth daily.   Yes Historical Provider, MD  fluticasone (FLONASE) 50 MCG/ACT nasal spray Place 2 sprays into both nostrils daily. 09/13/16  Yes Historical Provider, MD  hydrochlorothiazide (MICROZIDE) 12.5 MG capsule Take 1 capsule (12.5 mg total) by mouth daily. 03/25/16 11/08/17 Yes Almyra Deforest, PA  omeprazole (PRILOSEC) 40 MG capsule Take 40 mg by mouth daily.  02/07/15  Yes Historical Provider, MD  tiZANidine (ZANAFLEX) 4 MG tablet TAKE 4mg  BY MOUTH TWICE A DAY AS NEEDED FOR SPASMS 01/08/16  Yes Historical Provider, MD  cholecalciferol (VITAMIN D) 1000 UNITS tablet Take 1,000 Units by mouth daily.    Historical Provider, MD  naproxen (NAPROSYN) 500 MG tablet Take 1 tablet (500 mg total) by mouth 2 (two) times daily. 11/08/16   Dorie Rank, MD    Family History Family History  Problem Relation Age of Onset  . Cancer Mother   . Diabetes Father   . Heart disease Father   . Cancer Sister   . Heart disease Paternal Grandmother   . Heart disease Paternal Grandfather     Social History Social History  Substance Use Topics  .  Smoking status: Never Smoker  . Smokeless tobacco: Never Used  . Alcohol use No     Allergies   Losartan; Verapamil; Codeine; Lisinopril; Sulfa antibiotics; and Wellbutrin [bupropion]   Review of Systems Review of Systems  All other systems reviewed and are negative.    Physical Exam Updated Vital Signs BP (!) 162/87   Pulse 70   Temp 97.6 F (36.4 C) (Oral)   Resp 20   Ht 5' 3.5" (1.613 m)   Wt 79.8 kg   SpO2 99%   BMI 30.69 kg/m   Physical Exam  Constitutional: She appears well-developed and well-nourished. No distress.  HENT:  Head: Normocephalic and atraumatic.  Right Ear: External ear normal.  Left Ear: External ear normal.  Eyes: Conjunctivae are normal. Right eye exhibits no discharge. Left eye exhibits no discharge. No scleral icterus.  Neck: Neck supple. No tracheal deviation present.   Cardiovascular: Normal rate, regular rhythm and intact distal pulses.   Pulmonary/Chest: Effort normal and breath sounds normal. No stridor. No respiratory distress. She has no wheezes. She has no rales.  Abdominal: Soft. Bowel sounds are normal. She exhibits no distension. There is no tenderness. There is no rebound and no guarding.  Musculoskeletal: She exhibits no edema or tenderness.  Neurological: She is alert. She has normal strength. No cranial nerve deficit (no facial droop, extraocular movements intact, no slurred speech) or sensory deficit. She exhibits normal muscle tone. She displays no seizure activity. Coordination normal.  Skin: Skin is warm and dry. No rash noted.  Psychiatric: She has a normal mood and affect.  Nursing note and vitals reviewed.    ED Treatments / Results  Labs (all labs ordered are listed, but only abnormal results are displayed) Labs Reviewed  BASIC METABOLIC PANEL  CBC  I-STAT [place], ED  I-STAT [place]OPO[place], ED    EKG  EKG Interpretation  Date/Time:  Friday November 08 2016 13:06:16 EDT Ventricular Rate:  86 PR Interval:  156 QRS Duration: 76 QT Interval:  366 QTC Calculation: 437 R Axis:   54 Text Interpretation:  Normal sinus rhythm Cannot rule out Anterior infarct , age undetermined Abnormal ECG No significant change since last tracing Confirmed by Genieve Ramaswamy  MD-J, Quina Wilbourne [phone]) on 11/08/2016 2:16:44 PM       Radiology Dg Chest 2 View  Result Date: 11/08/2016 CLINICAL DATA:  Left arm pain. EXAM: CHEST  2 VIEW COMPARISON:  Chest x-ray 07/11/2009. FINDINGS: Mediastinum and hilar structures normal. Lungs are clear. No pleural effusion or pneumothorax. Heart size normal. No acute bony abnormality. Cervical spine fusion. IMPRESSION: No acute cardiopulmonary disease. Electronically Signed   By: Marcello Moores  Register   On: 11/08/2016 13:41    Procedures Procedures (including critical care time)  Medications Ordered in ED Medications - No data to  display   Initial Impression / Assessment and Plan / ED Course  I have reviewed the triage vital signs and the nursing notes.  Pertinent labs & imaging results that were available during my care of the patient were reviewed by me and considered in my medical decision making (see chart for details).   Pt presented to the ED with isolated left arm pain.  No known trauma and pain is not clearly musculoskeletal.  Sent to the ED to evaluate for possible cardiac etiology causing referred arm pain.   Low risk for heart disease.  Heart score of 2.  Serial troponin negative.  Doubt ACS.   Will dc home with nsaids.  Follow up with  pcp.  Warning signs and precautions discussed  Final Clinical Impressions(s) / ED Diagnoses   Final diagnoses:  Left arm pain    New Prescriptions New Prescriptions   NAPROXEN (NAPROSYN) 500 MG TABLET    Take 1 tablet (500 mg total) by mouth 2 (two) times daily.     Dorie Rank, MD 11/08/16 [phone]

## 2016-11-08 NOTE — Discharge Instructions (Signed)
Follow up with your doctor next week, take the medications as prescribed, return for chest pain, shortness of breath, worsening symptoms

## 2016-11-08 NOTE — ED Notes (Signed)
Pt oob and walking in room with steady gait. States no chest pain but left arm pain conti nues.

## 2016-11-26 ENCOUNTER — Ambulatory Visit (INDEPENDENT_AMBULATORY_CARE_PROVIDER_SITE_OTHER): Payer: Commercial Managed Care - HMO | Admitting: Family Medicine

## 2016-11-26 ENCOUNTER — Encounter: Payer: Self-pay | Admitting: Family Medicine

## 2016-11-26 VITALS — BP 162/98 | HR 98 | Temp 98.0°F | Ht 63.5 in | Wt 177.0 lb

## 2016-11-26 DIAGNOSIS — I10 Essential (primary) hypertension: Secondary | ICD-10-CM

## 2016-11-26 DIAGNOSIS — Z7689 Persons encountering health services in other specified circumstances: Secondary | ICD-10-CM

## 2016-11-26 DIAGNOSIS — E7800 Pure hypercholesterolemia, unspecified: Secondary | ICD-10-CM

## 2016-11-26 DIAGNOSIS — M79602 Pain in left arm: Secondary | ICD-10-CM | POA: Diagnosis not present

## 2016-11-26 NOTE — Progress Notes (Signed)
Subjective:    Patient ID: Connie Cisneros, female    DOB: 31-Jan-1951, 66 y.o.   MRN: [id number]  HPI Here today to establish care. Recently went to the emergency room with deep throbbing aching pain in the left upper arm. Emergency room evaluation included normal chest x-ray, and 2 sets of negative troponins. She denied any chest pain or shortness of breath. She denies any acid reflux or abdominal pain. Pain was limited to the upper portion of her right arm. It was not exacerbated by movement. Pain gradually improved over 24 hour period and resolve spontaneously. She denies any injury. Past medical history is significant, hyperlipidemia. She also has a history of less than 20% stenosis of the left internal carotid artery, past medical history migraine headaches, coincidental finding of thyroid cyst. Has a history of melanoma on her leg.  Sees dermatology q 6 months.  She's had a history of a total abdominal hysterectomy. Mammogram and pelvic exam is performed at her gynecologist annually. Colonoscopy was performed in 2015 and is up-to-date. Past Medical History:  Diagnosis Date  . Bruises easily   . Cancer ([place])   . 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 GERD (gastroesophageal reflux disease)   . Heart murmur   . History of migraine headaches   . Hypertension   . Left carotid artery stenosis    <20%  . Sinus congestion    Past Surgical History:  Procedure Laterality Date  . TOTAL VAGINAL HYSTERECTOMY     Current Outpatient Prescriptions on File Prior to Visit  Medication Sig Dispense Refill  . atorvastatin (LIPITOR) 40 MG tablet Take 1 tablet (40 mg total) by mouth daily. 90 tablet 3  . carvedilol (COREG) 6.25 MG tablet Take 1 tablet (6.25 mg total) by mouth 2 (two) times daily. 180 tablet 3  . cholecalciferol (VITAMIN D) 1000 UNITS tablet Take 1,000 Units by mouth daily.    Marland Kitchen estradiol (ESTRACE) 2 MG tablet Take 2 mg by mouth daily.    . fexofenadine  (ALLEGRA) 180 MG tablet Take 180 mg by mouth daily.    . fluticasone (FLONASE) 50 MCG/ACT nasal spray Place 2 sprays into both nostrils daily.  0  . hydrochlorothiazide (MICROZIDE) 12.5 MG capsule Take 1 capsule (12.5 mg total) by mouth daily. 90 capsule 3  . naproxen (NAPROSYN) 500 MG tablet Take 1 tablet (500 mg total) by mouth 2 (two) times daily. 30 tablet 0  . omeprazole (PRILOSEC) 40 MG capsule Take 40 mg by mouth daily.   0  . tiZANidine (ZANAFLEX) 4 MG tablet TAKE 4mg  BY MOUTH TWICE A DAY AS NEEDED FOR SPASMS  0   No current facility-administered medications on file prior to visit.    Allergies  Allergen Reactions  . Losartan     Unclear if it was losartan or verapamil that caused angioedema, both are stopped  . Verapamil     Stopped by urgent care in 03/22/2016 for possible angioedema, losartan was stopped on 03/25/16 by cardiology as it is a more likely culprit  . Codeine Nausea Only  . Lisinopril Other (See Comments)    Sleepy and tired  . Sulfa Antibiotics Nausea Only  . Wellbutrin [Bupropion] Other (See Comments)    Reaction to losartan and verapamil   Social History   Social History  . Marital status: Married    Spouse name: N/A  . Number of children: N/A  . Years of education: N/A  Occupational History  . Not on file.   Social History Main Topics  . Smoking status: Never Smoker  . Smokeless tobacco: Never Used  . Alcohol use No  . Drug use: No  . Sexual activity: Not on file   Other Topics Concern  . Not on file   Social History Narrative  . No narrative on file   Family History  Problem Relation Age of Onset  . Cancer Mother     breast, stomach  . Diabetes Father   . Heart disease Father   . Cancer Sister   . Heart disease Paternal Grandmother   . Heart disease Paternal Grandfather       Review of Systems  All other systems reviewed and are negative.      Objective:   Physical Exam  Constitutional: She is oriented to person, place, and  time. She appears well-developed and well-nourished. No distress.  HENT:  Head: Normocephalic and atraumatic.  Right Ear: External ear normal.  Left Ear: External ear normal.  Nose: Nose normal.  Mouth/Throat: Oropharynx is clear and moist. No oropharyngeal exudate.  Eyes: Conjunctivae are normal. Pupils are equal, round, and reactive to light.  Neck: Neck supple. No JVD present. No thyromegaly present.  Cardiovascular: Normal rate, regular rhythm and intact distal pulses.  Exam reveals no gallop and no friction rub.   Murmur heard. Pulmonary/Chest: Effort normal and breath sounds normal. No respiratory distress. She has no wheezes. She has no rales. She exhibits no tenderness.  Abdominal: Soft. Bowel sounds are normal. She exhibits no distension and no mass. There is no tenderness. There is no rebound and no guarding.  Musculoskeletal: Normal range of motion. She exhibits no edema, tenderness or deformity.  Lymphadenopathy:    She has no cervical adenopathy.  Neurological: She is alert and oriented to person, place, and time. She has normal reflexes. She displays normal reflexes. No cranial nerve deficit. She exhibits normal muscle tone. Coordination normal.  Skin: Skin is warm. No rash noted. She is not diaphoretic. No erythema. No pallor.  Psychiatric: She has a normal mood and affect. Her behavior is normal. Judgment and thought content normal.  Vitals reviewed.         Assessment & Plan:  Left arm pain - Plan: DG Humerus Left  Establishing care with new doctor, encounter for  Essential hypertension  Pure hypercholesterolemia  I believe the pain in her left arm to been neuropathic possibly from cervical degenerative disc disease. Obtain x-ray of the left humerus to rule out skeletal pathology such as malignancy. If pain returns, consider evaluation for cervical radiculopathy. Mammogram is up-to-date. Colonoscopy is up-to-date. Pap smear is not necessary. Immunizations are  up-to-date. I would like the patient return fasting for a CBC, CMP, fasting lipid panel. Goal LDL cholesterol is less than 100. Blood pressure today is significantly elevated.  I reviewed the emergency room records and her blood pressure in the emergency room was 162/87 as well. I will like to obtain baseline labs and depending upon the lab results, I would like to start the patient on amlodipine for high blood pressure. 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

## 2016-11-28 ENCOUNTER — Ambulatory Visit
Admission: RE | Admit: 2016-11-28 | Discharge: 2016-11-28 | Disposition: A | Discharge status: Home / Self Care | Payer: Commercial Managed Care - HMO | Source: Ambulatory Visit | Attending: Family Medicine | Admitting: Family Medicine

## 2016-11-28 DIAGNOSIS — M79602 Pain in left arm: Secondary | ICD-10-CM

## 2016-11-29 ENCOUNTER — Encounter: Payer: Self-pay | Admitting: Family Medicine

## 2016-12-03 ENCOUNTER — Other Ambulatory Visit: Payer: Commercial Managed Care - HMO

## 2016-12-03 LAB — CBC WITH DIFFERENTIAL/PLATELET
Basophils Absolute: 43 cells/uL (ref 0–200)
Basophils Relative: 1 %
Eosinophils Absolute: 86 cells/uL (ref 15–500)
Eosinophils Relative: 2 %
HCT: 37.4 % (ref 35.0–45.0)
Hemoglobin: 12.2 g/dL (ref 12.0–15.0)
Lymphocytes Relative: 38 %
Lymphs Abs: 1634 cells/uL (ref 850–3900)
MCH: 29.6 pg (ref 27.0–33.0)
MCHC: 32.6 g/dL (ref 32.0–36.0)
MCV: 90.8 fL (ref 80.0–100.0)
MPV: 11.5 fL (ref 7.5–12.5)
Monocytes Absolute: 301 cells/uL (ref 200–950)
Monocytes Relative: 7 %
Neutro Abs: 2236 cells/uL (ref 1500–7800)
Neutrophils Relative %: 52 %
Platelets: 236 10*3/uL (ref 140–400)
RBC: 4.12 MIL/uL (ref 3.80–5.10)
RDW: 14.6 % (ref 11.0–15.0)
WBC: 4.3 10*3/uL (ref 3.8–10.8)

## 2016-12-03 LAB — LIPID PANEL
Cholesterol: 168 mg/dL (ref <200)
HDL: 56 mg/dL (ref >50)
LDL Cholesterol: 94 mg/dL (ref <100)
Total CHOL/HDL Ratio: 3 Ratio (ref <5.0)
Triglycerides: 90 mg/dL (ref <150)
VLDL: 18 mg/dL (ref <30)

## 2016-12-03 LAB — COMPLETE METABOLIC PANEL WITH GFR
ALT: 16 U/L (ref 6–29)
AST: 21 U/L (ref 10–35)
Albumin: 3.8 g/dL (ref 3.6–5.1)
Alkaline Phosphatase: 86 U/L (ref 33–130)
BUN: 14 mg/dL (ref 7–25)
CO2: 26 mmol/L (ref 20–31)
Calcium: 9.1 mg/dL (ref 8.6–10.4)
Chloride: 101 mmol/L (ref 98–110)
Creat: 0.88 mg/dL (ref 0.50–0.99)
GFR, Est African American: 81 mL/min (ref >=60)
GFR, Est Non African American: 70 mL/min (ref >=60)
Glucose, Bld: 99 mg/dL (ref 70–99)
Potassium: 3.9 mmol/L (ref 3.5–5.3)
Sodium: 139 mmol/L (ref 135–146)
Total Bilirubin: 0.3 mg/dL (ref 0.2–1.2)
Total Protein: 7.4 g/dL (ref 6.1–8.1)

## 2016-12-06 ENCOUNTER — Encounter: Payer: Self-pay | Admitting: Family Medicine

## 2016-12-09 ENCOUNTER — Encounter: Payer: Self-pay | Admitting: Family Medicine

## 2016-12-18 ENCOUNTER — Ambulatory Visit (INDEPENDENT_AMBULATORY_CARE_PROVIDER_SITE_OTHER): Payer: Commercial Managed Care - HMO | Admitting: Physician Assistant

## 2016-12-18 ENCOUNTER — Encounter: Payer: Self-pay | Admitting: Physician Assistant

## 2016-12-18 VITALS — BP 140/88 | HR 76 | Temp 98.1°F | Resp 16 | Wt 174.2 lb

## 2016-12-18 DIAGNOSIS — J988 Other specified respiratory disorders: Secondary | ICD-10-CM

## 2016-12-18 DIAGNOSIS — B9689 Other specified bacterial agents as the cause of diseases classified elsewhere: Principal | ICD-10-CM

## 2016-12-18 MED ORDER — AZITHROMYCIN 250 MG PO TABS: ORAL_TABLET | ORAL | 0 refills | Status: DC

## 2016-12-18 NOTE — Progress Notes (Signed)
Patient ID: Connie Cisneros MRN: [id number], DOB: April 03, 1951, 66 y.o. Date of Encounter: 12/18/2016, 2:49 PM    Chief Complaint:  Chief Complaint  Patient presents with  . scratchy throat    went to urgent care   . Cough  . Nausea     HPI: 66 y.o. year old female presents For above.  She states that on Monday she went to urgent care and they did rapid strep test which was negative.  She states these symptoms started last Thursday. Started with some sore throat then developed cough. Monday had some nausea but the nausea has improved since then. Now for the last couple of days the cough has been the main symptom. Also just feels tired and run down and doesn't feel like she is getting better overall. Is having minimal nasal congestion or mucus from the nose. Mostly all cough. No fevers or chills.     Home Meds:   Outpatient Medications Prior to Visit  Medication Sig Dispense Refill  . atorvastatin (LIPITOR) 40 MG tablet Take 1 tablet (40 mg total) by mouth daily. 90 tablet 3  . carvedilol (COREG) 6.25 MG tablet Take 1 tablet (6.25 mg total) by mouth 2 (two) times daily. 180 tablet 3  . cholecalciferol (VITAMIN D) 1000 UNITS tablet Take 1,000 Units by mouth daily.    Marland Kitchen estradiol (ESTRACE) 2 MG tablet Take 2 mg by mouth daily.    . fexofenadine (ALLEGRA) 180 MG tablet Take 180 mg by mouth daily.    . fluticasone (FLONASE) 50 MCG/ACT nasal spray Place 2 sprays into both nostrils daily.  0  . hydrochlorothiazide (MICROZIDE) 12.5 MG capsule Take 1 capsule (12.5 mg total) by mouth daily. 90 capsule 3  . naproxen (NAPROSYN) 500 MG tablet Take 1 tablet (500 mg total) by mouth 2 (two) times daily. 30 tablet 0  . omeprazole (PRILOSEC) 40 MG capsule Take 40 mg by mouth daily.   0  . tiZANidine (ZANAFLEX) 4 MG tablet TAKE 4mg  BY MOUTH TWICE A DAY AS NEEDED FOR SPASMS  0   No facility-administered medications prior to visit.     Allergies:  Allergies  Allergen Reactions  . Losartan       Unclear if it was losartan or verapamil that caused angioedema, both are stopped  . Verapamil     Stopped by urgent care in 03/22/2016 for possible angioedema, losartan was stopped on 03/25/16 by cardiology as it is a more likely culprit  . Codeine Nausea Only  . Lisinopril Other (See Comments)    Sleepy and tired  . Sulfa Antibiotics Nausea Only  . Wellbutrin [Bupropion] Other (See Comments)    Reaction to losartan and verapamil      Review of Systems: See HPI for pertinent ROS. All other ROS negative.    Physical Exam: Blood pressure 140/88, pulse 76, temperature 98.1 F (36.7 C), temperature source Oral, resp. rate 16, weight 174 lb 3.2 oz (79 kg), SpO2 97 %., Body mass index is 30.37 kg/m. General:  WNWD WF. Appears in no acute distress. HEENT: Normocephalic, atraumatic, eyes without discharge, sclera non-icteric, nares are without discharge. Bilateral auditory canals clear, TM's are without perforation, pearly grey and translucent with reflective cone of light bilaterally. Oral cavity moist, posterior pharynx without exudate, erythema, peritonsillar abscess. No tenderness with percussion to frontal or maxillary sinuses bilaterally.  Neck: Supple. No thyromegaly. No lymphadenopathy. Lungs: Clear bilaterally to auscultation without wheezes, rales, or rhonchi. Breathing is unlabored. Heart: Regular rhythm. No  murmurs, rubs, or gallops. Msk:  Strength and tone normal for age. Extremities/Skin: Warm and dry. Neuro: Alert and oriented X 3. Moves all extremities spontaneously. Gait is normal. CNII-XII grossly in tact. Psych:  Responds to questions appropriately with a normal affect.     ASSESSMENT AND PLAN:  66 y.o. year old female with  1. Bacterial respiratory infection She is to take the azithromycin as directed. Follow-up if symptoms do not resolve within 1 week after completion of antibiotic. - azithromycin (ZITHROMAX) 250 MG tablet; Day 1: Take 2 daily. Days 2-5: Take 1  daily.  Dispense: 6 tablet; Refill: 0   Signed, [address] [place], [place], [facility] 12/18/2016 2:49 PM

## 2017-02-28 ENCOUNTER — Encounter: Payer: Self-pay | Admitting: Physician Assistant

## 2017-04-17 ENCOUNTER — Other Ambulatory Visit: Payer: Self-pay | Admitting: Physician Assistant

## 2017-04-17 NOTE — Telephone Encounter (Signed)
Please review for refill. Thanks!  

## 2017-07-03 ENCOUNTER — Encounter: Payer: Self-pay | Admitting: Family Medicine

## 2017-07-03 ENCOUNTER — Encounter: Payer: Self-pay | Admitting: Physician Assistant

## 2017-07-03 ENCOUNTER — Ambulatory Visit: Payer: 59 | Admitting: Physician Assistant

## 2017-07-03 ENCOUNTER — Other Ambulatory Visit: Payer: Self-pay

## 2017-07-03 VITALS — BP 134/72 | HR 75 | Temp 98.1°F | Resp 14 | Ht 63.5 in | Wt 177.4 lb

## 2017-07-03 DIAGNOSIS — B9689 Other specified bacterial agents as the cause of diseases classified elsewhere: Principal | ICD-10-CM

## 2017-07-03 DIAGNOSIS — J988 Other specified respiratory disorders: Secondary | ICD-10-CM | POA: Diagnosis not present

## 2017-07-03 MED ORDER — AMOXICILLIN-POT CLAVULANATE 875-125 MG PO TABS: 1.0000 | ORAL_TABLET | Freq: Two times a day (BID) | ORAL | 0 refills | Status: AC

## 2017-07-03 MED ORDER — PREDNISONE 20 MG PO TABS: ORAL_TABLET | ORAL | 0 refills | Status: DC

## 2017-07-03 MED ORDER — FLUTICASONE PROPIONATE 50 MCG/ACT NA SUSP: 2.0000 | Freq: Every day | NASAL | 2 refills | Status: DC

## 2017-07-03 NOTE — Progress Notes (Signed)
Patient ID: ALENI ANDRUS MRN: [id number], DOB: Mar 16, 1951, 66 y.o. Date of Encounter: 07/03/2017, 11:18 AM    Chief Complaint:  Chief Complaint  Patient presents with  . head stopped up  . Nasal Congestion    drainage      HPI: 66 y.o. year old female presents with above.   She states that she has been sick for over 1 week.  Says that she has had a lot of nasal congestion and has been stopped up.  Throat is feeling a little irritated.  Says that she is able to blow very little out of her nose --that it is mostly clogged.  Using Nettie pot and Flonase with no relief.  Has had no chest congestion.  No fevers or chills.     Home Meds:   Outpatient Medications Prior to Visit  Medication Sig Dispense Refill  . atorvastatin (LIPITOR) 40 MG tablet TAKE 1 TABLET BY MOUTH DAILY 90 tablet 0  . benzonatate (TESSALON) 200 MG capsule Take 200 mg by mouth 3 (three) times daily as needed.  0  . carvedilol (COREG) 6.25 MG tablet TAKE 1 TABLET BY MOUTH TWICE DAILY 180 tablet 0  . cholecalciferol (VITAMIN D) 1000 UNITS tablet Take 1,000 Units by mouth daily.    Marland Kitchen estradiol (ESTRACE) 2 MG tablet Take 2 mg by mouth daily.    . fexofenadine (ALLEGRA) 180 MG tablet Take 180 mg by mouth daily.    . hydrochlorothiazide (MICROZIDE) 12.5 MG capsule TAKE ONE CAPSULE BY MOUTH DAILY 90 capsule 0  . ipratropium (ATROVENT) 0.06 % nasal spray Place 0.06 sprays into both nostrils as needed.  0  . naproxen (NAPROSYN) 500 MG tablet Take 1 tablet (500 mg total) by mouth 2 (two) times daily. 30 tablet 0  . omeprazole (PRILOSEC) 40 MG capsule Take 40 mg by mouth daily.   0  . ondansetron (ZOFRAN) 4 MG tablet Take 4 mg by mouth 3 (three) times daily as needed.  0  . tiZANidine (ZANAFLEX) 4 MG tablet TAKE 4mg  BY MOUTH TWICE A DAY AS NEEDED FOR SPASMS  0  . YUVAFEM 10 MCG TABS vaginal tablet Place 10 mcg vaginally as needed.    . fluticasone (FLONASE) 50 MCG/ACT nasal spray Place 2 sprays into both nostrils  daily.  0  . azithromycin (ZITHROMAX) 250 MG tablet Day 1: Take 2 daily. Days 2-5: Take 1 daily. 6 tablet 0   No facility-administered medications prior to visit.     Allergies:  Allergies  Allergen Reactions  . Losartan     Unclear if it was losartan or verapamil that caused angioedema, both are stopped  . Verapamil     Stopped by urgent care in 03/22/2016 for possible angioedema, losartan was stopped on 03/25/16 by cardiology as it is a more likely culprit  . Codeine Nausea Only  . Lisinopril Other (See Comments)    Sleepy and tired  . Sulfa Antibiotics Nausea Only  . Wellbutrin [Bupropion] Other (See Comments)    Reaction to losartan and verapamil      Review of Systems: See HPI for pertinent ROS. All other ROS negative.    Physical Exam: Blood pressure 134/72, pulse 75, temperature 98.1 F (36.7 C), temperature source Oral, resp. rate 14, height 5' 3.5" (1.613 m), weight 80.5 kg (177 lb 6.4 oz), SpO2 98 %., Body mass index is 30.93 kg/m. General: WNWD WF.  Appears in no acute distress. HEENT: Normocephalic, atraumatic, eyes without discharge, sclera non-icteric, nares are  without discharge. Bilateral auditory canals clear, TM's are without perforation, pearly grey and translucent with reflective cone of light bilaterally. Oral cavity moist, posterior pharynx without exudate, erythema, peritonsillar abscess.  Positive tenderness with percussion to maxillary sinuses bilaterally.  Neck: Supple. No thyromegaly. No lymphadenopathy. Lungs: Clear bilaterally to auscultation without wheezes, rales, or rhonchi. Breathing is unlabored. Heart: Regular rhythm. No murmurs, rubs, or gallops. Extremities/Skin: Warm and dry.  Neuro: Alert and oriented X 3. Moves all extremities spontaneously. Gait is normal. CNII-XII grossly in tact. Psych:  Responds to questions appropriately with a normal affect.     ASSESSMENT AND PLAN:  66 y.o. year old female with  1. Bacterial respiratory  infection She is to take the Augmentin and the prednisone taper as directed.  Follow-up if symptoms do not resolve upon completion of these. - amoxicillin-clavulanate (AUGMENTIN) 875-125 MG tablet; Take 1 tablet by mouth 2 (two) times daily for 10 days.  Dispense: 20 tablet; Refill: 0 - predniSONE (DELTASONE) 20 MG tablet; Take 3 daily for 2 days, then 2 daily for 2 days, then 1 daily for 2 days.  Dispense: 12 tablet; Refill: 0   Signed, [id number] [place], [place], [facility] 07/03/2017 11:18 AM

## 2017-07-22 ENCOUNTER — Encounter: Payer: Self-pay | Admitting: Family Medicine

## 2017-07-22 ENCOUNTER — Ambulatory Visit: Payer: Commercial Managed Care - HMO | Admitting: Family Medicine

## 2017-07-22 VITALS — BP 158/90 | HR 74 | Temp 98.0°F | Resp 14 | Ht 63.5 in | Wt 177.0 lb

## 2017-07-22 DIAGNOSIS — J320 Chronic maxillary sinusitis: Secondary | ICD-10-CM

## 2017-07-22 MED ORDER — CEFDINIR 300 MG PO CAPS: 300.0000 mg | ORAL_CAPSULE | Freq: Two times a day (BID) | ORAL | 0 refills | Status: DC

## 2017-07-22 MED ORDER — PREDNISONE 20 MG PO TABS: ORAL_TABLET | ORAL | 0 refills | Status: DC

## 2017-07-22 NOTE — Progress Notes (Signed)
Subjective:    Patient ID: Connie Cisneros, female    DOB: 02-17-1951, 66 y.o.   MRN: [id number]  HPI  Patient has been sick now for more than 6 weeks.  Has been treated as a sinus infection with Augmentin for 10 days and prednisone.  This was done around the time of Thanksgiving.  Symptoms improved for a short period of time but never totally resolved and are now back as severe as ever.  Patient reports pain and pressure in both frontal sinuses, both maxillary sinuses.  Her right eye is draining constantly clear fluid due to an obstructed nasolacrimal duct.  She is unable to breathe through her nose due to severe sinus congestion and rhinorrhea.  She has a dull constant sinus headache, and subjective fevers. Past Medical History:  Diagnosis Date  . Bruises easily   . Cancer ([place])    melanoma  . 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 GERD (gastroesophageal reflux disease)   . Heart murmur   . History of migraine headaches   . Hypertension   . Left carotid artery stenosis    <20%  . Sinus congestion    Past Surgical History:  Procedure Laterality Date  . TOTAL VAGINAL HYSTERECTOMY     Current Outpatient Medications on File Prior to Visit  Medication Sig Dispense Refill  . atorvastatin (LIPITOR) 40 MG tablet TAKE 1 TABLET BY MOUTH DAILY 90 tablet 0  . carvedilol (COREG) 6.25 MG tablet TAKE 1 TABLET BY MOUTH TWICE DAILY 180 tablet 0  . cholecalciferol (VITAMIN D) 1000 UNITS tablet Take 1,000 Units by mouth daily.    Marland Kitchen estradiol (ESTRACE) 2 MG tablet Take 2 mg by mouth daily.    . fexofenadine (ALLEGRA) 180 MG tablet Take 180 mg by mouth daily.    . fluticasone (FLONASE) 50 MCG/ACT nasal spray Place 2 sprays into both nostrils daily. 16 g 2  . hydrochlorothiazide (MICROZIDE) 12.5 MG capsule TAKE ONE CAPSULE BY MOUTH DAILY 90 capsule 0  . naproxen (NAPROSYN) 500 MG tablet Take 1 tablet (500 mg total) by mouth 2 (two) times daily. 30 tablet 0  . omeprazole  (PRILOSEC) 40 MG capsule Take 40 mg by mouth daily.   0  . ondansetron (ZOFRAN) 4 MG tablet Take 4 mg by mouth 3 (three) times daily as needed.  0  . tiZANidine (ZANAFLEX) 4 MG tablet TAKE 4mg  BY MOUTH TWICE A DAY AS NEEDED FOR SPASMS  0  . YUVAFEM 10 MCG TABS vaginal tablet Place 10 mcg vaginally as needed.     No current facility-administered medications on file prior to visit.    Allergies  Allergen Reactions  . Losartan     Unclear if it was losartan or verapamil that caused angioedema, both are stopped  . Verapamil     Stopped by urgent care in 03/22/2016 for possible angioedema, losartan was stopped on 03/25/16 by cardiology as it is a more likely culprit  . Codeine Nausea Only  . Lisinopril Other (See Comments)    Sleepy and tired  . Sulfa Antibiotics Nausea Only  . Wellbutrin [Bupropion] Other (See Comments)    Reaction to losartan and verapamil   Social History   Socioeconomic History  . Marital status: Married    Spouse name: Not on file  . Number of children: Not on file  . Years of education: Not on file  . Highest education level: Not on file  Social Needs  .  Financial resource strain: Not on file  . Food insecurity - worry: Not on file  . Food insecurity - inability: Not on file  . Transportation needs - medical: Not on file  . Transportation needs - non-medical: Not on file  Occupational History  . Not on file  Tobacco Use  . Smoking status: Never Smoker  . Smokeless tobacco: Never Used  Substance and Sexual Activity  . Alcohol use: No  . Drug use: No  . Sexual activity: Not on file  Other Topics Concern  . Not on file  Social History Narrative  . Not on file     Review of Systems  All other systems reviewed and are negative.      Objective:   Physical Exam  Constitutional: She appears well-developed and well-nourished.  HENT:  Right Ear: Tympanic membrane, external ear and ear canal normal.  Left Ear: Tympanic membrane, external ear and ear  canal normal.  Nose: Mucosal edema and rhinorrhea present. Right sinus exhibits maxillary sinus tenderness and frontal sinus tenderness. Left sinus exhibits maxillary sinus tenderness and frontal sinus tenderness.  Mouth/Throat: Oropharynx is clear and moist. No oropharyngeal exudate.  Eyes: Conjunctivae are normal.  Neck: Neck supple.  Cardiovascular: Normal rate, regular rhythm and normal heart sounds.  Pulmonary/Chest: Effort normal and breath sounds normal. No respiratory distress. She has no wheezes. She has no rales. She exhibits no tenderness.  Lymphadenopathy:    She has no cervical adenopathy.  Vitals reviewed.         Assessment & Plan:

## 2017-11-01 ENCOUNTER — Other Ambulatory Visit: Payer: Self-pay | Admitting: Cardiovascular Disease

## 2017-12-11 ENCOUNTER — Other Ambulatory Visit: Payer: Self-pay | Admitting: Cardiovascular Disease

## 2017-12-12 NOTE — Telephone Encounter (Signed)
REFILL 

## 2017-12-23 ENCOUNTER — Encounter: Payer: Self-pay | Admitting: Family Medicine

## 2017-12-23 ENCOUNTER — Ambulatory Visit (INDEPENDENT_AMBULATORY_CARE_PROVIDER_SITE_OTHER): Payer: 59 | Admitting: Family Medicine

## 2017-12-23 ENCOUNTER — Other Ambulatory Visit: Payer: Self-pay

## 2017-12-23 VITALS — BP 146/84 | HR 79 | Temp 98.3°F | Ht 63.5 in | Wt 183.0 lb

## 2017-12-23 DIAGNOSIS — E7800 Pure hypercholesterolemia, unspecified: Secondary | ICD-10-CM

## 2017-12-23 DIAGNOSIS — R5382 Chronic fatigue, unspecified: Secondary | ICD-10-CM

## 2017-12-23 DIAGNOSIS — I10 Essential (primary) hypertension: Secondary | ICD-10-CM

## 2017-12-23 DIAGNOSIS — Z0000 Encounter for general adult medical examination without abnormal findings: Secondary | ICD-10-CM | POA: Diagnosis not present

## 2017-12-23 DIAGNOSIS — L659 Nonscarring hair loss, unspecified: Secondary | ICD-10-CM | POA: Diagnosis not present

## 2017-12-23 NOTE — Progress Notes (Signed)
Subjective:    Patient ID: Connie Cisneros, female    DOB: 25-Oct-1950, 67 y.o.   MRN: [id number]  HPI Patient is here today for complete physical exam.  She is concerned because over the last few months, she is noticed gradual hair loss.  The hair is becoming very dry and brittle.  She is also developed significant fatigue.  She reports constipation.  She is concerned this may be her thyroid.  Her hairdresser has also mentioned that her hair seems to be thinning out.  Her mammogram is performed at her gynecologist.  It is due again this summer but she is already scheduled this on her own.  She had a bone density test performed last year at her gynecologist which was normal.  Her colonoscopy was performed in 2015 and is up-to-date.  Her immunizations are up-to-date.  She is due for hepatitis C and HIV screening.  Her blood pressure today is slightly elevated at 146/84. Past Medical History:  Diagnosis Date  . Bruises easily   . Cancer ([place])    melanoma  . 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 GERD (gastroesophageal reflux disease)   . Heart murmur   . History of migraine headaches   . Hypertension   . Left carotid artery stenosis    <20%  . Sinus congestion    Past Surgical History:  Procedure Laterality Date  . TOTAL VAGINAL HYSTERECTOMY     Current Outpatient Medications on File Prior to Visit  Medication Sig Dispense Refill  . atorvastatin (LIPITOR) 40 MG tablet Take 1 tablet (40 mg total) by mouth daily at 6 PM. NEED OV. 7 tablet 0  . carvedilol (COREG) 6.25 MG tablet Take 1 tablet (6.25 mg total) by mouth 2 (two) times daily with a meal. NEED OV. 15 tablet 0  . cholecalciferol (VITAMIN D) 1000 UNITS tablet Take 1,000 Units by mouth daily.    Marland Kitchen estradiol (ESTRACE) 2 MG tablet Take 2 mg by mouth daily.    . fexofenadine (ALLEGRA) 180 MG tablet Take 180 mg by mouth daily.    . hydrochlorothiazide (MICROZIDE) 12.5 MG capsule TAKE 1 CAPSULE(12.5 MG) BY MOUTH  DAILY 7 capsule 0  . naproxen (NAPROSYN) 500 MG tablet Take 1 tablet (500 mg total) by mouth 2 (two) times daily. 30 tablet 0  . omeprazole (PRILOSEC) 40 MG capsule Take 40 mg by mouth daily.   0  . ondansetron (ZOFRAN) 4 MG tablet Take 4 mg by mouth 3 (three) times daily as needed.  0  . tiZANidine (ZANAFLEX) 4 MG tablet TAKE 4mg  BY MOUTH TWICE A DAY AS NEEDED FOR SPASMS  0  . YUVAFEM 10 MCG TABS vaginal tablet Place 10 mcg vaginally as needed.    . fluticasone (FLONASE) 50 MCG/ACT nasal spray Place 2 sprays into both nostrils daily. (Patient not taking: Reported on 12/23/2017) 16 g 2   No current facility-administered medications on file prior to visit.    Allergies  Allergen Reactions  . Losartan     Unclear if it was losartan or verapamil that caused angioedema, both are stopped  . Verapamil     Stopped by urgent care in 03/22/2016 for possible angioedema, losartan was stopped on 03/25/16 by cardiology as it is a more likely culprit  . Codeine Nausea Only  . Lisinopril Other (See Comments)    Sleepy and tired  . Sulfa Antibiotics Nausea Only  . Wellbutrin [Bupropion] Other (See Comments)  Reaction to losartan and verapamil   Social History   Socioeconomic History  . Marital status: Married    Spouse name: Not on file  . Number of children: Not on file  . Years of education: Not on file  . Highest education level: Not on file  Occupational History  . Not on file  Social Needs  . Financial resource strain: Not on file  . Food insecurity:    Worry: Not on file    Inability: Not on file  . Transportation needs:    Medical: Not on file    Non-medical: Not on file  Tobacco Use  . Smoking status: Never Smoker  . Smokeless tobacco: Never Used  Substance and Sexual Activity  . Alcohol use: No  . Drug use: No  . Sexual activity: Not on file  Lifestyle  . Physical activity:    Days per week: Not on file    Minutes per session: Not on file  . Stress: Not on file    Relationships  . Social connections:    Talks on phone: Not on file    Gets together: Not on file    Attends religious service: Not on file    Active member of club or organization: Not on file    Attends meetings of clubs or organizations: Not on file    Relationship status: Not on file  . Intimate partner violence:    Fear of current or ex partner: Not on file    Emotionally abused: Not on file    Physically abused: Not on file    Forced sexual activity: Not on file  Other Topics Concern  . Not on file  Social History Narrative  . Not on file   Family History  Problem Relation Age of Onset  . Cancer Mother        breast, stomach  . Diabetes Father   . Heart disease Father   . Cancer Sister   . Heart disease Paternal Grandmother   . Heart disease Paternal Grandfather       Review of Systems  All other systems reviewed and are negative.      Objective:   Physical Exam  Constitutional: She is oriented to person, place, and time. She appears well-developed and well-nourished. No distress.  HENT:  Head: Normocephalic and atraumatic.  Right Ear: External ear normal.  Left Ear: External ear normal.  Nose: Nose normal.  Mouth/Throat: Oropharynx is clear and moist. No oropharyngeal exudate.  Eyes: Pupils are equal, round, and reactive to light. Conjunctivae are normal.  Neck: Neck supple. No JVD present. No thyromegaly present.  Cardiovascular: Normal rate, regular rhythm and intact distal pulses. Exam reveals no gallop and no friction rub.  Murmur heard. Pulmonary/Chest: Effort normal and breath sounds normal. No respiratory distress. She has no wheezes. She has no rales. She exhibits no tenderness.  Abdominal: Soft. Bowel sounds are normal. She exhibits no distension and no mass. There is no tenderness. There is no rebound and no guarding.  Musculoskeletal: Normal range of motion. She exhibits no edema, tenderness or deformity.  Lymphadenopathy:    She has no  cervical adenopathy.  Neurological: She is alert and oriented to person, place, and time. She has normal reflexes. No cranial nerve deficit. She exhibits normal muscle tone. Coordination normal.  Skin: Skin is warm. No rash noted. She is not diaphoretic. No erythema. No pallor.  Psychiatric: She has a normal mood and affect. Her behavior is normal. Judgment and  thought content normal.  Vitals reviewed.         Assessment & Plan:  General medical exam - Plan: CBC with Differential/Platelet, COMPLETE METABOLIC PANEL WITH GFR, Lipid panel, TSH, Vitamin B12, Hepatitis C Antibody, HIV antibody  Essential hypertension  Pure hypercholesterolemia  Chronic fatigue  Hair loss  Blood pressure is elevated.  I have recommended the patient check her blood pressure every day for 1 week and report the values to me.  If greater than 140/90, I would titrate her medication further.  Her mammogram, Pap smear, and bone density test were performed at her gynecologist.  Her colonoscopy is up-to-date.  I will screen the patient for HIV and hepatitis C.  I will also check a CBC, CMP, and fasting lipid panel given her history of hypertension I will check a TSH given her recent hair loss and fatigue.  We will check a vitamin B12 level given her recent fatigue.

## 2017-12-24 LAB — COMPLETE METABOLIC PANEL WITH GFR
AG Ratio: 1.3 (calc) (ref 1.0–2.5)
ALT: 14 U/L (ref 6–29)
AST: 17 U/L (ref 10–35)
Albumin: 4.1 g/dL (ref 3.6–5.1)
Alkaline phosphatase (APISO): 101 U/L (ref 33–130)
BUN: 16 mg/dL (ref 7–25)
CO2: 28 mmol/L (ref 20–32)
Calcium: 9.5 mg/dL (ref 8.6–10.4)
Chloride: 102 mmol/L (ref 98–110)
Creat: 0.89 mg/dL (ref 0.50–0.99)
GFR, Est African American: 79 mL/min/{1.73_m2} (ref >=60)
GFR, Est Non African American: 68 mL/min/{1.73_m2} (ref >=60)
Globulin: 3.2 g/dL (calc) (ref 1.9–3.7)
Glucose, Bld: 86 mg/dL (ref 65–99)
Potassium: 3.9 mmol/L (ref 3.5–5.3)
Sodium: 138 mmol/L (ref 135–146)
Total Bilirubin: 0.3 mg/dL (ref 0.2–1.2)
Total Protein: 7.3 g/dL (ref 6.1–8.1)

## 2017-12-24 LAB — CBC WITH DIFFERENTIAL/PLATELET
Basophils Absolute: 41 cells/uL (ref 0–200)
Basophils Relative: 0.9 %
Eosinophils Absolute: 69 cells/uL (ref 15–500)
Eosinophils Relative: 1.5 %
HCT: 35.2 % (ref 35.0–45.0)
Hemoglobin: 11.8 g/dL (ref 11.7–15.5)
Lymphs Abs: 1651 cells/uL (ref 850–3900)
MCH: 28.7 pg (ref 27.0–33.0)
MCHC: 33.5 g/dL (ref 32.0–36.0)
MCV: 85.6 fL (ref 80.0–100.0)
MPV: 12.6 fL — ABNORMAL HIGH (ref 7.5–12.5)
Monocytes Relative: 9.4 %
Neutro Abs: 2406 cells/uL (ref 1500–7800)
Neutrophils Relative %: 52.3 %
Platelets: 247 10*3/uL (ref 140–400)
RBC: 4.11 10*6/uL (ref 3.80–5.10)
RDW: 13.8 % (ref 11.0–15.0)
Total Lymphocyte: 35.9 %
WBC mixed population: 432 cells/uL (ref 200–950)
WBC: 4.6 10*3/uL (ref 3.8–10.8)

## 2017-12-24 LAB — LIPID PANEL
Cholesterol: 175 mg/dL (ref <200)
HDL: 49 mg/dL — ABNORMAL LOW (ref >50)
LDL Cholesterol (Calc): 100 mg/dL (calc) — ABNORMAL HIGH
Non-HDL Cholesterol (Calc): 126 mg/dL (calc) (ref <130)
Total CHOL/HDL Ratio: 3.6 (calc) (ref <5.0)
Triglycerides: 165 mg/dL — ABNORMAL HIGH (ref <150)

## 2017-12-24 LAB — HEPATITIS C ANTIBODY
Hepatitis C Ab: NONREACTIVE
SIGNAL TO CUT-OFF: 0.03 (ref <1.00)

## 2017-12-24 LAB — TSH: TSH: 1.41 mIU/L (ref 0.40–4.50)

## 2017-12-24 LAB — HIV ANTIBODY (ROUTINE TESTING W REFLEX): HIV 1&2 Ab, 4th Generation: NONREACTIVE

## 2017-12-24 LAB — VITAMIN B12: Vitamin B-12: 275 pg/mL (ref 200–1100)

## 2018-01-14 ENCOUNTER — Other Ambulatory Visit: Payer: Self-pay | Admitting: Physician Assistant

## 2018-01-25 ENCOUNTER — Other Ambulatory Visit: Payer: Self-pay | Admitting: Cardiovascular Disease

## 2018-01-26 NOTE — Telephone Encounter (Signed)
Rx request sent to pharmacy.  

## 2018-01-27 DIAGNOSIS — K5904 Chronic idiopathic constipation: Secondary | ICD-10-CM | POA: Insufficient documentation

## 2018-01-27 DIAGNOSIS — Z800 Family history of malignant neoplasm of digestive organs: Secondary | ICD-10-CM | POA: Insufficient documentation

## 2018-01-27 DIAGNOSIS — K219 Gastro-esophageal reflux disease without esophagitis: Secondary | ICD-10-CM | POA: Insufficient documentation

## 2018-01-27 DIAGNOSIS — Z86010 Personal history of colonic polyps: Secondary | ICD-10-CM | POA: Insufficient documentation

## 2018-03-02 LAB — HM PAP SMEAR

## 2018-03-02 LAB — HM MAMMOGRAPHY

## 2018-03-03 ENCOUNTER — Ambulatory Visit: Payer: 59 | Admitting: Family Medicine

## 2018-03-03 ENCOUNTER — Encounter: Payer: Self-pay | Admitting: Family Medicine

## 2018-03-03 VITALS — BP 168/88 | HR 78 | Temp 98.2°F | Resp 16 | Ht 63.5 in | Wt 186.0 lb

## 2018-03-03 DIAGNOSIS — J310 Chronic rhinitis: Secondary | ICD-10-CM

## 2018-03-03 DIAGNOSIS — J329 Chronic sinusitis, unspecified: Secondary | ICD-10-CM

## 2018-03-03 MED ORDER — LEVOCETIRIZINE DIHYDROCHLORIDE 5 MG PO TABS: 5.0000 mg | ORAL_TABLET | Freq: Every evening | ORAL | 0 refills | Status: DC

## 2018-03-03 MED ORDER — CARVEDILOL 6.25 MG PO TABS: 6.2500 mg | ORAL_TABLET | Freq: Two times a day (BID) | ORAL | 3 refills | Status: DC

## 2018-03-03 MED ORDER — BENZONATATE 100 MG PO CAPS: 200.0000 mg | ORAL_CAPSULE | Freq: Three times a day (TID) | ORAL | 0 refills | Status: DC | PRN

## 2018-03-03 MED ORDER — HYDROCHLOROTHIAZIDE 12.5 MG PO CAPS: 12.5000 mg | ORAL_CAPSULE | Freq: Every day | ORAL | 3 refills | Status: DC

## 2018-03-03 MED ORDER — AMOXICILLIN-POT CLAVULANATE 875-125 MG PO TABS: 1.0000 | ORAL_TABLET | Freq: Two times a day (BID) | ORAL | 0 refills | Status: DC

## 2018-03-03 NOTE — Progress Notes (Signed)
Subjective:    Patient ID: Connie Cisneros, female    DOB: [birth year], 67 y.o.   MRN: [id number]  HPI   Patient has been sick for 1 week.  She reports rhinorrhea, head congestion, postnasal drip, and nonproductive cough due to the postnasal drip.  She denies any fevers or chills.  She denies any shortness of breath.  She denies any purulent sputum.  She denies any sinus headaches.  She does have some mild pressure in her sinuses diffusely throughout her maxillary and frontal sinus areas.  Her blood pressure today is extremely high but she is not taking her carvedilol or hydrochlorothiazide Past Medical History:  Diagnosis Date  . Bruises easily   . Cancer ([place])    melanoma  . 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 GERD (gastroesophageal reflux disease)   . Heart murmur   . History of migraine headaches   . Hypertension   . Left carotid artery stenosis    <20%  . Sinus congestion    Past Surgical History:  Procedure Laterality Date  . TOTAL VAGINAL HYSTERECTOMY     Current Outpatient Medications on File Prior to Visit  Medication Sig Dispense Refill  . atorvastatin (LIPITOR) 40 MG tablet Take 1 tablet (40 mg total) by mouth daily at 6 PM. Please schedule appointment for refills. 7 tablet 0  . cholecalciferol (VITAMIN D) 1000 UNITS tablet Take 1,000 Units by mouth daily.    Marland Kitchen estradiol (ESTRACE) 2 MG tablet Take 2 mg by mouth daily.    . fexofenadine (ALLEGRA) 180 MG tablet Take 180 mg by mouth daily.    Marland Kitchen omeprazole (PRILOSEC) 40 MG capsule Take 40 mg by mouth daily.   0  . ondansetron (ZOFRAN) 4 MG tablet Take 4 mg by mouth 3 (three) times daily as needed.  0  . tiZANidine (ZANAFLEX) 4 MG tablet TAKE 4mg  BY MOUTH TWICE A DAY AS NEEDED FOR SPASMS  0  . YUVAFEM 10 MCG TABS vaginal tablet Place 10 mcg vaginally as needed.    . fluticasone (FLONASE) 50 MCG/ACT nasal spray Place 2 sprays into both nostrils daily. (Patient not taking: Reported on 12/23/2017) 16  g 2  . naproxen (NAPROSYN) 500 MG tablet Take 1 tablet (500 mg total) by mouth 2 (two) times daily. (Patient not taking: Reported on 03/03/2018) 30 tablet 0   No current facility-administered medications on file prior to visit.    Allergies  Allergen Reactions  . Losartan     Unclear if it was losartan or verapamil that caused angioedema, both are stopped  . Verapamil     Stopped by urgent care in 03/22/2016 for possible angioedema, losartan was stopped on 03/25/16 by cardiology as it is a more likely culprit  . Codeine Nausea Only  . Lisinopril Other (See Comments)    Sleepy and tired  . Sulfa Antibiotics Nausea Only  . Wellbutrin [Bupropion] Other (See Comments)    Reaction to losartan and verapamil   Social History   Socioeconomic History  . Marital status: Married    Spouse name: Not on file  . Number of children: Not on file  . Years of education: Not on file  . Highest education level: Not on file  Occupational History  . Not on file  Social Needs  . Financial resource strain: Not on file  . Food insecurity:    Worry: Not on file    Inability: Not on file  .  Transportation needs:    Medical: Not on file    Non-medical: Not on file  Tobacco Use  . Smoking status: Never Smoker  . Smokeless tobacco: Never Used  Substance and Sexual Activity  . Alcohol use: No  . Drug use: No  . Sexual activity: Not on file  Lifestyle  . Physical activity:    Days per week: Not on file    Minutes per session: Not on file  . Stress: Not on file  Relationships  . Social connections:    Talks on phone: Not on file    Gets together: Not on file    Attends religious service: Not on file    Active member of club or organization: Not on file    Attends meetings of clubs or organizations: Not on file    Relationship status: Not on file  . Intimate partner violence:    Fear of current or ex partner: Not on file    Emotionally abused: Not on file    Physically abused: Not on file     Forced sexual activity: Not on file  Other Topics Concern  . Not on file  Social History Narrative  . Not on file     Review of Systems  All other systems reviewed and are negative.      Objective:   Physical Exam  Constitutional: She appears well-developed and well-nourished.  HENT:  Right Ear: Tympanic membrane, external ear and ear canal normal.  Left Ear: Tympanic membrane, external ear and ear canal normal.  Nose: Mucosal edema and rhinorrhea present. Right sinus exhibits no maxillary sinus tenderness and no frontal sinus tenderness. Left sinus exhibits no maxillary sinus tenderness and no frontal sinus tenderness.  Mouth/Throat: Oropharynx is clear and moist. No oropharyngeal exudate.  Eyes: Conjunctivae are normal.  Neck: Neck supple.  Cardiovascular: Normal rate, regular rhythm and normal heart sounds.  Pulmonary/Chest: Effort normal and breath sounds normal. No respiratory distress. She has no wheezes. She has no rales. She exhibits no tenderness.  Lymphadenopathy:    She has no cervical adenopathy.  Vitals reviewed.         Assessment & Plan:  Rhinosinusitis - Plan: levocetirizine (XYZAL) 5 MG tablet, benzonatate (TESSALON) 100 MG capsule, amoxicillin-clavulanate (AUGMENTIN) 875-125 MG tablet  Patient has rhinosinusitis most likely due to a virus.  I have recommended tincture of time.  Given how high her blood pressure is, I will use Xyzal 5 mg p.o. daily for sinus congestion and rhinorrhea.  She can use Tessalon Perles 200 mg every 8 hours as needed for cough.  I have asked her to wait 3-4 more days and symptoms should be gradually improving.  If not and she develops fever, sinus pain, sinus headache, she can then use Augmentin 875 mg p.o. twice daily for 10 days for a secondary sinus infection.  However explained to the patient that 90% of sinus infections improved in 7 to 10 days without antibiotics and have asked her to allow a little more time.  I believe her blood  pressure is extremely high and I want her to resume her carvedilol and hydrochlorothiazide which I refilled for her today and asked her to make an appointment to see her cardiologist.  Thus far she ran out of her medication because she had not seen him yet.  She had not taken the medication in 3 weeks

## 2018-04-01 ENCOUNTER — Encounter: Payer: Self-pay | Admitting: *Deleted

## 2018-04-05 ENCOUNTER — Other Ambulatory Visit: Payer: Self-pay | Admitting: Family Medicine

## 2018-04-05 DIAGNOSIS — J329 Chronic sinusitis, unspecified: Secondary | ICD-10-CM

## 2018-04-23 NOTE — Progress Notes (Signed)
Cardiology Office Note:    Date:  04/24/2018   ID:  Connie Cisneros, DOB 25-Jan-1951, MRN [id number]  PCP:  Susy Frizzle, MD  Cardiologist:  Shelva Majestic, MD   Referring MD: Susy Frizzle, MD   Chief Complaint  Patient presents with  . Follow-up    hypertension    History of Present Illness:    Connie Cisneros is a 67 y.o. female with a hx of hypertension, mild (less than 20%) carotid artery plaque, history of right lower extremity DVT,  migraine headaches, and hyperlipidemia.  She last saw PA Almyra Deforest in clinic on 03/25/2016 at that time she has been maintained on losartan/HCTZ and verapamil for many years.  Echocardiogram with normal LVEF of 60 to 65%.  She went to urgent care on 03/22/2016 with swelling of mouth and tongue.  She was treated for thrush and also angioedema, her verapamil was stopped by the urgent care provider thinking it was the likely culprit.  Upon presentation, case was discussed with the pharmacy staff and determined that the likely culprit was losartan.  She was switched to HCTZ 12.5 mg daily and Coreg 6.25 mg twice daily.  She returns today for clinic follow-up. She is doing well on coreg and HCTZ. Pressure today is perfect. She states she has trouble remembering to take her medicine. We brainstormed ways to help her remember, since missing her medicine can result in an elevated systolic pressure in the 638G.    Past Medical History:  Diagnosis Date  . Bruises easily   . Cancer ([place])    melanoma  . 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 GERD (gastroesophageal reflux disease)   . Heart murmur   . History of migraine headaches   . Hypertension   . Left carotid artery stenosis    <20%  . Sinus congestion     Past Surgical History:  Procedure Laterality Date  . TOTAL VAGINAL HYSTERECTOMY      Current Medications: Current Meds  Medication Sig  . amoxicillin-clavulanate (AUGMENTIN) 875-125 MG tablet Take 1 tablet by mouth  2 (two) times daily.  Marland Kitchen atorvastatin (LIPITOR) 40 MG tablet Take 1 tablet (40 mg total) by mouth daily at 6 PM.  . benzonatate (TESSALON) 100 MG capsule Take 2 capsules (200 mg total) by mouth 3 (three) times daily as needed for cough.  Marland Kitchen Bioflavonoid Products (BIOFLEX) TABS Take 1 tablet by mouth daily.  . carvedilol (COREG) 6.25 MG tablet Take 1 tablet (6.25 mg total) by mouth 2 (two) times daily with a meal.  . cholecalciferol (VITAMIN D) 1000 UNITS tablet Take 1,000 Units by mouth daily.  Marland Kitchen estradiol (ESTRACE) 2 MG tablet Take 2 mg by mouth daily.  . fexofenadine (ALLEGRA) 180 MG tablet Take 180 mg by mouth daily.  . fluticasone (FLONASE) 50 MCG/ACT nasal spray Place 2 sprays into both nostrils daily.  . hydrochlorothiazide (MICROZIDE) 12.5 MG capsule Take 1 capsule (12.5 mg total) by mouth daily.  Marland Kitchen levocetirizine (XYZAL) 5 MG tablet Take 1 tablet (5 mg total) by mouth every evening.  . naproxen (NAPROSYN) 500 MG tablet Take 1 tablet (500 mg total) by mouth 2 (two) times daily.  Marland Kitchen omeprazole (PRILOSEC) 40 MG capsule Take 40 mg by mouth daily.   . ondansetron (ZOFRAN) 4 MG tablet Take 4 mg by mouth 3 (three) times daily as needed.  Marland Kitchen tiZANidine (ZANAFLEX) 4 MG tablet TAKE 4mg  BY MOUTH TWICE A DAY  AS NEEDED FOR SPASMS  . vitamin B-12 (CYANOCOBALAMIN) 100 MCG tablet Take 100 mcg by mouth daily.  Merril Abbe 10 MCG TABS vaginal tablet Place 10 mcg vaginally as needed.  . [DISCONTINUED] atorvastatin (LIPITOR) 40 MG tablet Take 1 tablet (40 mg total) by mouth daily at 6 PM. Please schedule appointment for refills.  . [DISCONTINUED] carvedilol (COREG) 6.25 MG tablet Take 1 tablet (6.25 mg total) by mouth 2 (two) times daily with a meal. Please schedule appointment for refills.  . [DISCONTINUED] hydrochlorothiazide (MICROZIDE) 12.5 MG capsule Take 1 capsule (12.5 mg total) by mouth daily. Please schedule appointment for refills.     Allergies:   Losartan; Verapamil; Codeine; Lisinopril; Sulfa  antibiotics; and Wellbutrin [bupropion]   Social History   Socioeconomic History  . Marital status: Married    Spouse name: Not on file  . Number of children: Not on file  . Years of education: Not on file  . Highest education level: Not on file  Occupational History  . Not on file  Social Needs  . Financial resource strain: Not on file  . Food insecurity:    Worry: Not on file    Inability: Not on file  . Transportation needs:    Medical: Not on file    Non-medical: Not on file  Tobacco Use  . Smoking status: Never Smoker  . Smokeless tobacco: Never Used  Substance and Sexual Activity  . Alcohol use: No  . Drug use: No  . Sexual activity: Not on file  Lifestyle  . Physical activity:    Days per week: Not on file    Minutes per session: Not on file  . Stress: Not on file  Relationships  . Social connections:    Talks on phone: Not on file    Gets together: Not on file    Attends religious service: Not on file    Active member of club or organization: Not on file    Attends meetings of clubs or organizations: Not on file    Relationship status: Not on file  Other Topics Concern  . Not on file  Social History Narrative  . Not on file     Family History: The patient's family history includes Cancer in her mother and sister; Diabetes in her father; Heart disease in her father, paternal grandfather, and paternal grandmother.  ROS:   Please see the history of present illness.     All other systems reviewed and are negative.  EKGs/Labs/Other Studies Reviewed:    The following studies were reviewed today: none  EKG:  EKG is ordered today.  The ekg ordered today demonstrates sinus  Recent Labs: 12/23/2017: ALT 14; BUN 16; Creat 0.89; Hemoglobin 11.8; Platelets 247; Potassium 3.9; Sodium 138; TSH 1.41  Recent Lipid Panel    Component Value Date/Time   CHOL 175 12/23/2017 1524   CHOL 159 07/21/2013 1336   TRIG 165 (H) 12/23/2017 1524   TRIG 75 07/21/2013 1336     HDL 49 (L) 12/23/2017 1524   HDL 54 07/21/2013 1336   CHOLHDL 3.6 12/23/2017 1524   VLDL 18 12/03/2016 0812   LDLCALC 100 (H) 12/23/2017 1524   LDLCALC 90 07/21/2013 1336    Physical Exam:    VS:  BP 122/80   Pulse 69   Ht 5\' 3"  (1.6 m)   Wt 184 lb 12.8 oz (83.8 kg)   BMI 32.74 kg/m     Wt Readings from Last 3 Encounters:  04/24/18 184 lb  12.8 oz (83.8 kg)  03/03/18 186 lb (84.4 kg)  12/23/17 183 lb (83 kg)     GEN: Well nourished, well developed in no acute distress HEENT: Normal NECK: No JVD; No carotid bruits CARDIAC: RRR, no murmurs, rubs, gallops RESPIRATORY:  Clear to auscultation without rales, wheezing or rhonchi  ABDOMEN: Soft, non-tender, non-distended MUSCULOSKELETAL:  No edema; No deformity  SKIN: Warm and dry NEUROLOGIC:  Alert and oriented x 3 PSYCHIATRIC:  Normal affect   ASSESSMENT:    1. Essential hypertension   2. Pure hypercholesterolemia   3. Atherosclerosis of right carotid artery    PLAN:    In order of problems listed above:  Essential hypertension She is doing well when she remembers to take her medication. Pressure today is perfect. Will continue coreg and HCTZ. Brainstormed ways to help her remember her medications. If she continues to have trouble, may need to switch coreg to a once daily medication.  Pure hypercholesterolemia 12/23/2017: Cholesterol 175; HDL 49; LDL Cholesterol (Calc) 100; Triglycerides 165 Continue statin medication. Check in 1 year.  Atherosclerosis of right carotid artery  Stable, no bruit on exam.   Follow up with Dr. Claiborne Billings in 1 year, sooner if needed.   Medication Adjustments/Labs and Tests Ordered: Current medicines are reviewed at length with the patient today.  Concerns regarding medicines are outlined above.  Orders Placed This Encounter  Procedures  . EKG 12-Lead   Meds ordered this encounter  Medications  . atorvastatin (LIPITOR) 40 MG tablet    Sig: Take 1 tablet (40 mg total) by mouth daily  at 6 PM.    Dispense:  90 tablet    Refill:  3  . carvedilol (COREG) 6.25 MG tablet    Sig: Take 1 tablet (6.25 mg total) by mouth 2 (two) times daily with a meal.    Dispense:  180 tablet    Refill:  3  . hydrochlorothiazide (MICROZIDE) 12.5 MG capsule    Sig: Take 1 capsule (12.5 mg total) by mouth daily.    Dispense:  90 capsule    Refill:  3    Signed, Ledora Bottcher, [place]  04/24/2018 8:30 AM    [facility]

## 2018-04-24 ENCOUNTER — Ambulatory Visit: Payer: 59 | Admitting: Physician Assistant

## 2018-04-24 ENCOUNTER — Encounter: Payer: Self-pay | Admitting: Physician Assistant

## 2018-04-24 VITALS — BP 122/80 | HR 69 | Ht 63.0 in | Wt 184.8 lb

## 2018-04-24 DIAGNOSIS — I6521 Occlusion and stenosis of right carotid artery: Secondary | ICD-10-CM | POA: Diagnosis not present

## 2018-04-24 DIAGNOSIS — I10 Essential (primary) hypertension: Secondary | ICD-10-CM

## 2018-04-24 DIAGNOSIS — E7800 Pure hypercholesterolemia, unspecified: Secondary | ICD-10-CM | POA: Diagnosis not present

## 2018-04-24 MED ORDER — ATORVASTATIN CALCIUM 40 MG PO TABS: 40.0000 mg | ORAL_TABLET | Freq: Every day | ORAL | 3 refills | Status: DC

## 2018-04-24 MED ORDER — HYDROCHLOROTHIAZIDE 12.5 MG PO CAPS: 12.5000 mg | ORAL_CAPSULE | Freq: Every day | ORAL | 3 refills | Status: DC

## 2018-04-24 MED ORDER — CARVEDILOL 6.25 MG PO TABS: 6.2500 mg | ORAL_TABLET | Freq: Two times a day (BID) | ORAL | 3 refills | Status: DC

## 2018-04-24 NOTE — Patient Instructions (Addendum)
Medication Instructions:  Your physician recommends that you continue on your current medications as directed. Please refer to the Current Medication list given to you today. Refills sent to the pharmacy If you need a refill on your cardiac medications before your next appointment, please call your pharmacy.  Labwork: None   Testing/Procedures: None   Follow-Up: Your physician wants you to follow-up in: 12 months with Dr Claiborne Billings. You will receive a reminder letter in the mail two months in advance. If you don't receive a letter, please call our office to schedule the follow-up appointment.  Any Other Special Instructions Will Be Listed Below (If Applicable).

## 2018-06-16 ENCOUNTER — Other Ambulatory Visit: Payer: Self-pay | Admitting: Physician Assistant

## 2018-07-31 ENCOUNTER — Encounter: Payer: Self-pay | Admitting: Family Medicine

## 2018-07-31 ENCOUNTER — Ambulatory Visit: Payer: 59 | Admitting: Family Medicine

## 2018-07-31 VITALS — BP 170/94 | HR 70 | Temp 97.8°F | Resp 16 | Ht 63.5 in | Wt 184.0 lb

## 2018-07-31 DIAGNOSIS — M5412 Radiculopathy, cervical region: Secondary | ICD-10-CM

## 2018-07-31 MED ORDER — PREDNISONE 20 MG PO TABS: ORAL_TABLET | ORAL | 0 refills | Status: DC

## 2018-07-31 NOTE — Progress Notes (Signed)
Subjective:    Patient ID: Connie Cisneros, female    DOB: 02-21-1951, 67 y.o.   MRN: [id number]  HPI  Patient has a remote history of surgery performed on her cervical spine by Dr. Arnoldo Morale.  Over the last several months, she has developed pain in the right side of her neck radiating into her right arm.  She reports burning paresthesias radiating from her neck down her right arm into her right hand.  She is dropping things in her right hand.  She reports weakness and numbness in her right hand.  She reports a radiating aching throbbing pain from her neck into her right shoulder.  She is unable to sleep on her arm at night.  The entire arm will go numb and ache and throb.  Examination today, she has a negative empty can sign.  She has a negative Hawkins sign.  She has no pain with abduction.  She can abduct her right shoulder to 160 degrees without difficulty.  She has full internal and external rotation without pain.  However she has a positive Spurling maneuver to the right side.  She also has diminished reflexes checked at the biceps and triceps compared to the left upper extremity.  There is no numbness on exam.  She has a negative Phalen's maneuver.  She has a negative Tinel sign. Past Medical History:  Diagnosis Date  . Bruises easily   . Cancer ([place])    melanoma  . 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 GERD (gastroesophageal reflux disease)   . Heart murmur   . History of migraine headaches   . Hypertension   . Left carotid artery stenosis    <20%  . Sinus congestion    Past Surgical History:  Procedure Laterality Date  . TOTAL VAGINAL HYSTERECTOMY     Current Outpatient Medications on File Prior to Visit  Medication Sig Dispense Refill  . amoxicillin-clavulanate (AUGMENTIN) 875-125 MG tablet Take 1 tablet by mouth 2 (two) times daily. 20 tablet 0  . atorvastatin (LIPITOR) 40 MG tablet Take 1 tablet (40 mg total) by mouth daily at 6 PM. 90 tablet 3  .  Bioflavonoid Products (BIOFLEX) TABS Take 1 tablet by mouth daily.    . carvedilol (COREG) 6.25 MG tablet Take 1 tablet (6.25 mg total) by mouth 2 (two) times daily with a meal. 180 tablet 3  . cholecalciferol (VITAMIN D) 1000 UNITS tablet Take 1,000 Units by mouth daily.    Marland Kitchen estradiol (ESTRACE) 2 MG tablet Take 2 mg by mouth daily.    . fexofenadine (ALLEGRA) 180 MG tablet Take 180 mg by mouth daily.    . fluticasone (FLONASE) 50 MCG/ACT nasal spray Place 2 sprays into both nostrils daily. 16 g 2  . hydrochlorothiazide (MICROZIDE) 12.5 MG capsule Take 1 capsule (12.5 mg total) by mouth daily. 90 capsule 3  . levocetirizine (XYZAL) 5 MG tablet Take 1 tablet (5 mg total) by mouth every evening. 30 tablet 0  . naproxen (NAPROSYN) 500 MG tablet Take 1 tablet (500 mg total) by mouth 2 (two) times daily. 30 tablet 0  . omeprazole (PRILOSEC) 40 MG capsule Take 40 mg by mouth daily.   0  . ondansetron (ZOFRAN) 4 MG tablet Take 4 mg by mouth 3 (three) times daily as needed.  0  . tiZANidine (ZANAFLEX) 4 MG tablet TAKE 4mg  BY MOUTH TWICE A DAY AS NEEDED FOR SPASMS  0  . vitamin  B-12 (CYANOCOBALAMIN) 100 MCG tablet Take 100 mcg by mouth daily.    Merril Abbe 10 MCG TABS vaginal tablet Place 10 mcg vaginally as needed.     No current facility-administered medications on file prior to visit.    Allergies  Allergen Reactions  . Losartan     Unclear if it was losartan or verapamil that caused angioedema, both are stopped  . Verapamil     Stopped by urgent care in 03/22/2016 for possible angioedema, losartan was stopped on 03/25/16 by cardiology as it is a more likely culprit  . Codeine Nausea Only  . Lisinopril Other (See Comments)    Sleepy and tired  . Sulfa Antibiotics Nausea Only  . Wellbutrin [Bupropion] Other (See Comments)    Reaction to losartan and verapamil   Social History   Socioeconomic History  . Marital status: Married    Spouse name: Not on file  . Number of children: Not on file    . Years of education: Not on file  . Highest education level: Not on file  Occupational History  . Not on file  Social Needs  . Financial resource strain: Not on file  . Food insecurity:    Worry: Not on file    Inability: Not on file  . Transportation needs:    Medical: Not on file    Non-medical: Not on file  Tobacco Use  . Smoking status: Never Smoker  . Smokeless tobacco: Never Used  Substance and Sexual Activity  . Alcohol use: No  . Drug use: No  . Sexual activity: Not on file  Lifestyle  . Physical activity:    Days per week: Not on file    Minutes per session: Not on file  . Stress: Not on file  Relationships  . Social connections:    Talks on phone: Not on file    Gets together: Not on file    Attends religious service: Not on file    Active member of club or organization: Not on file    Attends meetings of clubs or organizations: Not on file    Relationship status: Not on file  . Intimate partner violence:    Fear of current or ex partner: Not on file    Emotionally abused: Not on file    Physically abused: Not on file    Forced sexual activity: Not on file  Other Topics Concern  . Not on file  Social History Narrative  . Not on file     Review of Systems  All other systems reviewed and are negative.      Objective:   Physical Exam Vitals signs reviewed.  Constitutional:      Appearance: She is well-developed.  HENT:     Nose:     Left Sinus: No maxillary sinus tenderness.     Mouth/Throat:     Pharynx: No oropharyngeal exudate.  Eyes:     Conjunctiva/sclera: Conjunctivae normal.  Cardiovascular:     Rate and Rhythm: Normal rate and regular rhythm.     Heart sounds: Normal heart sounds.  Pulmonary:     Effort: Pulmonary effort is normal. No respiratory distress.     Breath sounds: Normal breath sounds. No wheezing or rales.  Chest:     Chest wall: No tenderness.  Musculoskeletal:     Right shoulder: She exhibits normal range of motion,  no tenderness, no effusion and no crepitus.     Cervical back: She exhibits tenderness and pain.  Lymphadenopathy:     Cervical: No cervical adenopathy.  Neurological:     Cranial Nerves: Cranial nerves are intact. No cranial nerve deficit.     Sensory: Sensation is intact. No sensory deficit.     Deep Tendon Reflexes:     Reflex Scores:      Tricep reflexes are 1+ on the right side and 2+ on the left side.      Bicep reflexes are 1+ on the right side and 2+ on the left side.      Brachioradialis reflexes are 2+ on the right side and 2+ on the left side.   See hpi       Assessment & Plan:  Cervical radiculopathy - Plan: MR Cervical Spine Wo Contrast  Patient has right-sided cervical radiculopathy.  Symptoms been ongoing now for 2 months and are getting worse.  I concur with her rheumatologist who is recommended an MRI of the cervical spine for cervical radiculopathy.  I will proceed with an MRI of the cervical spine to evaluate further.  Meanwhile start the patient on a prednisone taper pack.  If the MRI does confirm a suspicion of right-sided nerve root impingement, I will refer her back to her neurosurgeon.  Could also consider gabapentin for symptomatic relief.  Her blood pressure is elevated however the patient admits that she has not been taking her blood pressure medication.  She also states that she is nervous in the doctor's office and then when she checks it at home is much better.  She declines further blood pressure medication and states that she will take her original blood pressure medication and call me with the results on Monday and start checking her blood pressure 2-3 times a day at home

## 2018-08-04 ENCOUNTER — Telehealth: Payer: Self-pay | Admitting: Family Medicine

## 2018-08-04 ENCOUNTER — Other Ambulatory Visit: Payer: Self-pay | Admitting: Family Medicine

## 2018-08-04 MED ORDER — DIAZEPAM 10 MG PO TABS: 10.0000 mg | ORAL_TABLET | Freq: Two times a day (BID) | ORAL | 0 refills | Status: DC | PRN

## 2018-08-04 NOTE — Telephone Encounter (Signed)
Pt called and states that she is having a MRI Saturday and you said you would call in some valium for her.

## 2018-08-08 ENCOUNTER — Ambulatory Visit
Admission: RE | Admit: 2018-08-08 | Discharge: 2018-08-08 | Disposition: A | Discharge status: Home / Self Care | Payer: 59 | Source: Ambulatory Visit | Attending: Family Medicine | Admitting: Family Medicine

## 2018-08-08 DIAGNOSIS — M5412 Radiculopathy, cervical region: Secondary | ICD-10-CM

## 2018-08-18 ENCOUNTER — Other Ambulatory Visit: Payer: Self-pay | Admitting: Family Medicine

## 2018-08-18 DIAGNOSIS — M47812 Spondylosis without myelopathy or radiculopathy, cervical region: Secondary | ICD-10-CM | POA: Insufficient documentation

## 2018-08-18 DIAGNOSIS — M5020 Other cervical disc displacement, unspecified cervical region: Secondary | ICD-10-CM | POA: Insufficient documentation

## 2018-08-27 ENCOUNTER — Encounter: Payer: Self-pay | Admitting: Family Medicine

## 2018-09-15 ENCOUNTER — Encounter: Payer: Self-pay | Admitting: Family Medicine

## 2018-09-15 ENCOUNTER — Ambulatory Visit: Payer: 59 | Admitting: Family Medicine

## 2018-09-15 VITALS — BP 162/94 | HR 86 | Temp 98.2°F | Resp 14 | Ht 63.5 in | Wt 187.0 lb

## 2018-09-15 DIAGNOSIS — R6889 Other general symptoms and signs: Secondary | ICD-10-CM

## 2018-09-15 LAB — INFLUENZA A AND B AG, IMMUNOASSAY
INFLUENZA A ANTIGEN: NOT DETECTED
INFLUENZA B ANTIGEN: NOT DETECTED

## 2018-09-15 NOTE — Progress Notes (Signed)
Subjective:    Patient ID: Connie Cisneros, female    DOB: [birth year], 68 y.o.   MRN: [id number]  HPI  Patient's coworker has been diagnosed with the flu as well as pneumonia has been out of work for the last few days.  Starting yesterday, the patient developed diffuse body aches, head congestion, sore scratchy throat, and a nonproductive cough.  Body aches have worsened today.  She denies any fevers.  She denies any shortness of breath.  She denies any chest pain.  She continues to have a sore scratchy throat.  Flu test today is negative.  She denies any stridor.  She denies any shortness of breath.  She denies any trouble swallowing although it is painful when she swallows.  Flu test today is negative Past Medical History:  Diagnosis Date  . Bruises easily   . Cancer ([place])    melanoma  . 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 GERD (gastroesophageal reflux disease)   . Heart murmur   . History of migraine headaches   . Hypertension   . Left carotid artery stenosis    <20%  . Sinus congestion    Past Surgical History:  Procedure Laterality Date  . TOTAL VAGINAL HYSTERECTOMY     Current Outpatient Medications on File Prior to Visit  Medication Sig Dispense Refill  . atorvastatin (LIPITOR) 40 MG tablet Take 1 tablet (40 mg total) by mouth daily at 6 PM. 90 tablet 3  . Bioflavonoid Products (BIOFLEX) TABS Take 1 tablet by mouth daily.    . carvedilol (COREG) 6.25 MG tablet Take 1 tablet (6.25 mg total) by mouth 2 (two) times daily with a meal. 180 tablet 3  . cholecalciferol (VITAMIN D) 1000 UNITS tablet Take 1,000 Units by mouth daily.    . diazepam (VALIUM) 10 MG tablet Take 1 tablet (10 mg total) by mouth every 12 (twelve) hours as needed for anxiety (Take 1 hour prior to MRI). 2 tablet 0  . estradiol (ESTRACE) 2 MG tablet Take 2 mg by mouth daily.    . fluticasone (FLONASE) 50 MCG/ACT nasal spray Place 2 sprays into both nostrils daily. 16 g 2  .  hydrochlorothiazide (MICROZIDE) 12.5 MG capsule TAKE 1 CAPSULE(12.5 MG) BY MOUTH DAILY 90 capsule 3  . levocetirizine (XYZAL) 5 MG tablet Take 1 tablet (5 mg total) by mouth every evening. 30 tablet 0  . naproxen (NAPROSYN) 500 MG tablet Take 1 tablet (500 mg total) by mouth 2 (two) times daily. 30 tablet 0  . omeprazole (PRILOSEC) 40 MG capsule Take 40 mg by mouth daily.   0  . ondansetron (ZOFRAN) 4 MG tablet Take 4 mg by mouth 3 (three) times daily as needed.  0  . tiZANidine (ZANAFLEX) 4 MG tablet TAKE 4mg  BY MOUTH TWICE A DAY AS NEEDED FOR SPASMS  0  . vitamin B-12 (CYANOCOBALAMIN) 100 MCG tablet Take 100 mcg by mouth daily.    Merril Abbe 10 MCG TABS vaginal tablet Place 10 mcg vaginally as needed.    . meloxicam (MOBIC) 15 MG tablet TK 1 T PO QD     No current facility-administered medications on file prior to visit.    Allergies  Allergen Reactions  . Losartan     Unclear if it was losartan or verapamil that caused angioedema, both are stopped  . Verapamil     Stopped by urgent care in 03/22/2016 for possible angioedema, losartan was stopped on  03/25/16 by cardiology as it is a more likely culprit  . Codeine Nausea Only  . Lisinopril Other (See Comments)    Sleepy and tired  . Sulfa Antibiotics Nausea Only  . Wellbutrin [Bupropion] Other (See Comments)    Reaction to losartan and verapamil   Social History   Socioeconomic History  . Marital status: Married    Spouse name: Not on file  . Number of children: Not on file  . Years of education: Not on file  . Highest education level: Not on file  Occupational History  . Not on file  Social Needs  . Financial resource strain: Not on file  . Food insecurity:    Worry: Not on file    Inability: Not on file  . Transportation needs:    Medical: Not on file    Non-medical: Not on file  Tobacco Use  . Smoking status: Never Smoker  . Smokeless tobacco: Never Used  Substance and Sexual Activity  . Alcohol use: No  . Drug use:  No  . Sexual activity: Not on file  Lifestyle  . Physical activity:    Days per week: Not on file    Minutes per session: Not on file  . Stress: Not on file  Relationships  . Social connections:    Talks on phone: Not on file    Gets together: Not on file    Attends religious service: Not on file    Active member of club or organization: Not on file    Attends meetings of clubs or organizations: Not on file    Relationship status: Not on file  . Intimate partner violence:    Fear of current or ex partner: Not on file    Emotionally abused: Not on file    Physically abused: Not on file    Forced sexual activity: Not on file  Other Topics Concern  . Not on file  Social History Narrative  . Not on file     Review of Systems  All other systems reviewed and are negative.      Objective:   Physical Exam Vitals signs reviewed.  Constitutional:      General: She is not in acute distress.    Appearance: Normal appearance. She is well-developed. She is not ill-appearing, toxic-appearing or diaphoretic.  HENT:     Right Ear: Tympanic membrane, ear canal and external ear normal.     Left Ear: Tympanic membrane, ear canal and external ear normal.     Nose: Mucosal edema, congestion and rhinorrhea present.     Right Sinus: No maxillary sinus tenderness or frontal sinus tenderness.     Left Sinus: No maxillary sinus tenderness or frontal sinus tenderness.     Mouth/Throat:     Pharynx: Oropharynx is clear. No oropharyngeal exudate or posterior oropharyngeal erythema.  Eyes:     General:        Right eye: No discharge.        Left eye: No discharge.     Conjunctiva/sclera: Conjunctivae normal.  Neck:     Musculoskeletal: Neck supple.  Cardiovascular:     Rate and Rhythm: Normal rate and regular rhythm.     Heart sounds: Normal heart sounds.  Pulmonary:     Effort: Pulmonary effort is normal. No respiratory distress.     Breath sounds: Normal breath sounds. No wheezing or  rales.  Chest:     Chest wall: No tenderness.  Lymphadenopathy:     Cervical:  No cervical adenopathy.  Neurological:     Mental Status: She is alert.           Assessment & Plan:  Flu-like symptoms - Plan: Influenza A and B Ag, Immunoassay  Flu test today is negative and patient is afebrile.  I believe she has a flulike illness/viral upper respiratory infection.  I recommended tincture of time.  I will treat her symptomatically with Duke's Magic mouthwash 1 teaspoon gargle and swallow every 4 hours as needed for sore throat.  She can also use ibuprofen 400 to 800 mg every 8 hours as needed for fever body aches.  Her blood pressure today is significantly elevated.  We had a long discussion about this.  It has been like this before.  However she believes she has whitecoat syndrome.  She states that she checks her blood pressure at home and is in the 130s over 80s.  I have asked the patient to check her blood pressure every day at home over the next several weeks and notify me of the values.  If greater than 140/90, we will need to add additional medication.  Reassess in 48 hours if no better or recheck immediately if worsening.

## 2018-12-17 ENCOUNTER — Other Ambulatory Visit: Payer: Self-pay

## 2018-12-17 ENCOUNTER — Ambulatory Visit (INDEPENDENT_AMBULATORY_CARE_PROVIDER_SITE_OTHER): Payer: 59 | Admitting: Family Medicine

## 2018-12-17 DIAGNOSIS — H8110 Benign paroxysmal vertigo, unspecified ear: Secondary | ICD-10-CM | POA: Diagnosis not present

## 2018-12-17 MED ORDER — MECLIZINE HCL 25 MG PO TABS: 25.0000 mg | ORAL_TABLET | Freq: Three times a day (TID) | ORAL | 0 refills | Status: DC | PRN

## 2018-12-17 NOTE — Progress Notes (Signed)
Subjective:    Patient ID: Connie Cisneros, female    DOB: 1950-12-24, 68 y.o.   MRN: [id number]  HPI Patient is being seen today as a phone visit.  Patient consents to be seen over the telephone.  Phone call began at 154.  Phone call ended at 205.  Patient is only at home.  I am currently in my office.  She states for the last 3 days, she is felt dizzy almost like she is "drunk".  She reports that whenever she rolls over in bed, or sits up, or turns her head to the side, the room will start to spin around and round out of control.  She will feel extremely nauseated.  She denies any tinnitus.  She denies any hearing loss.  She denies any severe headache or otalgia.  She denies any head trauma or other neurologic deficit.  She denies any fevers or chills.  Blood pressure has been averaging between 110 and 130/60-70 Past Medical History:  Diagnosis Date  . Bruises easily   . Cancer ([place])    melanoma  . 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 GERD (gastroesophageal reflux disease)   . Heart murmur   . History of migraine headaches   . Hypertension   . Left carotid artery stenosis    <20%  . Sinus congestion    Past Surgical History:  Procedure Laterality Date  . TOTAL VAGINAL HYSTERECTOMY     Current Outpatient Medications on File Prior to Visit  Medication Sig Dispense Refill  . atorvastatin (LIPITOR) 40 MG tablet Take 1 tablet (40 mg total) by mouth daily at 6 PM. 90 tablet 3  . Bioflavonoid Products (BIOFLEX) TABS Take 1 tablet by mouth daily.    . carvedilol (COREG) 6.25 MG tablet Take 1 tablet (6.25 mg total) by mouth 2 (two) times daily with a meal. 180 tablet 3  . cholecalciferol (VITAMIN D) 1000 UNITS tablet Take 1,000 Units by mouth daily.    . diazepam (VALIUM) 10 MG tablet Take 1 tablet (10 mg total) by mouth every 12 (twelve) hours as needed for anxiety (Take 1 hour prior to MRI). 2 tablet 0  . estradiol (ESTRACE) 2 MG tablet Take 2 mg by mouth daily.     . fluticasone (FLONASE) 50 MCG/ACT nasal spray Place 2 sprays into both nostrils daily. 16 g 2  . hydrochlorothiazide (MICROZIDE) 12.5 MG capsule TAKE 1 CAPSULE(12.5 MG) BY MOUTH DAILY 90 capsule 3  . levocetirizine (XYZAL) 5 MG tablet Take 1 tablet (5 mg total) by mouth every evening. 30 tablet 0  . meloxicam (MOBIC) 15 MG tablet TK 1 T PO QD    . naproxen (NAPROSYN) 500 MG tablet Take 1 tablet (500 mg total) by mouth 2 (two) times daily. 30 tablet 0  . omeprazole (PRILOSEC) 40 MG capsule Take 40 mg by mouth daily.   0  . ondansetron (ZOFRAN) 4 MG tablet Take 4 mg by mouth 3 (three) times daily as needed.  0  . tiZANidine (ZANAFLEX) 4 MG tablet TAKE 4mg  BY MOUTH TWICE A DAY AS NEEDED FOR SPASMS  0  . vitamin B-12 (CYANOCOBALAMIN) 100 MCG tablet Take 100 mcg by mouth daily.    Merril Abbe 10 MCG TABS vaginal tablet Place 10 mcg vaginally as needed.     No current facility-administered medications on file prior to visit.    Allergies  Allergen Reactions  . Losartan  Unclear if it was losartan or verapamil that caused angioedema, both are stopped  . Verapamil     Stopped by urgent care in 03/22/2016 for possible angioedema, losartan was stopped on 03/25/16 by cardiology as it is a more likely culprit  . Codeine Nausea Only  . Lisinopril Other (See Comments)    Sleepy and tired  . Sulfa Antibiotics Nausea Only  . Wellbutrin [Bupropion] Other (See Comments)    Reaction to losartan and verapamil   Social History   Socioeconomic History  . Marital status: Married    Spouse name: Not on file  . Number of children: Not on file  . Years of education: Not on file  . Highest education level: Not on file  Occupational History  . Not on file  Social Needs  . Financial resource strain: Not on file  . Food insecurity:    Worry: Not on file    Inability: Not on file  . Transportation needs:    Medical: Not on file    Non-medical: Not on file  Tobacco Use  . Smoking status: Never  Smoker  . Smokeless tobacco: Never Used  Substance and Sexual Activity  . Alcohol use: No  . Drug use: No  . Sexual activity: Not on file  Lifestyle  . Physical activity:    Days per week: Not on file    Minutes per session: Not on file  . Stress: Not on file  Relationships  . Social connections:    Talks on phone: Not on file    Gets together: Not on file    Attends religious service: Not on file    Active member of club or organization: Not on file    Attends meetings of clubs or organizations: Not on file    Relationship status: Not on file  . Intimate partner violence:    Fear of current or ex partner: Not on file    Emotionally abused: Not on file    Physically abused: Not on file    Forced sexual activity: Not on file  Other Topics Concern  . Not on file  Social History Narrative  . Not on file      Review of Systems  All other systems reviewed and are negative.      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  I spent 10 minutes with the patient explaining the natural history of this condition and why it occurs.  She can take meclizine 25 mg every 8 hours as needed for vertigo.  I explained to her that most cases resolve spontaneously over 1 to 2 weeks and I have recommended that she take precautions over the next 2 weeks to avoid situations where she could be put in danger if she became dizzy such as driving or climbing.  Recheck in 1 to 2 weeks if no better or sooner if worse

## 2019-02-26 ENCOUNTER — Other Ambulatory Visit: Payer: Self-pay

## 2019-02-26 ENCOUNTER — Other Ambulatory Visit: Payer: 59

## 2019-02-26 DIAGNOSIS — Z0000 Encounter for general adult medical examination without abnormal findings: Secondary | ICD-10-CM

## 2019-02-27 LAB — CBC WITH DIFFERENTIAL/PLATELET
Absolute Monocytes: 431 cells/uL (ref 200–950)
Basophils Absolute: 29 cells/uL (ref 0–200)
Basophils Relative: 0.6 %
Eosinophils Absolute: 69 cells/uL (ref 15–500)
Eosinophils Relative: 1.4 %
HCT: 37.1 % (ref 35.0–45.0)
Hemoglobin: 12.6 g/dL (ref 11.7–15.5)
Lymphs Abs: 1387 cells/uL (ref 850–3900)
MCH: 30.1 pg (ref 27.0–33.0)
MCHC: 34 g/dL (ref 32.0–36.0)
MCV: 88.5 fL (ref 80.0–100.0)
MPV: 12.1 fL (ref 7.5–12.5)
Monocytes Relative: 8.8 %
Neutro Abs: 2984 cells/uL (ref 1500–7800)
Neutrophils Relative %: 60.9 %
Platelets: 256 10*3/uL (ref 140–400)
RBC: 4.19 10*6/uL (ref 3.80–5.10)
RDW: 14.8 % (ref 11.0–15.0)
Total Lymphocyte: 28.3 %
WBC: 4.9 10*3/uL (ref 3.8–10.8)

## 2019-02-27 LAB — COMPREHENSIVE METABOLIC PANEL
AG Ratio: 1.1 (calc) (ref 1.0–2.5)
ALT: 16 U/L (ref 6–29)
AST: 20 U/L (ref 10–35)
Albumin: 3.9 g/dL (ref 3.6–5.1)
Alkaline phosphatase (APISO): 94 U/L (ref 37–153)
BUN: 15 mg/dL (ref 7–25)
CO2: 27 mmol/L (ref 20–32)
Calcium: 9.7 mg/dL (ref 8.6–10.4)
Chloride: 100 mmol/L (ref 98–110)
Creat: 0.93 mg/dL (ref 0.50–0.99)
Globulin: 3.5 g/dL (calc) (ref 1.9–3.7)
Glucose, Bld: 103 mg/dL — ABNORMAL HIGH (ref 65–99)
Potassium: 3.9 mmol/L (ref 3.5–5.3)
Sodium: 137 mmol/L (ref 135–146)
Total Bilirubin: 0.5 mg/dL (ref 0.2–1.2)
Total Protein: 7.4 g/dL (ref 6.1–8.1)

## 2019-02-27 LAB — LIPID PANEL
Cholesterol: 173 mg/dL (ref <200)
HDL: 48 mg/dL — ABNORMAL LOW (ref >=50)
LDL Cholesterol (Calc): 101 mg/dL (calc) — ABNORMAL HIGH
Non-HDL Cholesterol (Calc): 125 mg/dL (calc) (ref <130)
Total CHOL/HDL Ratio: 3.6 (calc) (ref <5.0)
Triglycerides: 138 mg/dL (ref <150)

## 2019-03-08 ENCOUNTER — Ambulatory Visit (INDEPENDENT_AMBULATORY_CARE_PROVIDER_SITE_OTHER): Payer: 59 | Admitting: Family Medicine

## 2019-03-08 ENCOUNTER — Other Ambulatory Visit: Payer: Self-pay

## 2019-03-08 ENCOUNTER — Encounter: Payer: Self-pay | Admitting: Family Medicine

## 2019-03-08 VITALS — BP 142/78 | HR 88 | Temp 97.8°F | Resp 14 | Ht 63.5 in | Wt 198.0 lb

## 2019-03-08 DIAGNOSIS — I10 Essential (primary) hypertension: Secondary | ICD-10-CM

## 2019-03-08 DIAGNOSIS — Z0001 Encounter for general adult medical examination with abnormal findings: Secondary | ICD-10-CM

## 2019-03-08 DIAGNOSIS — Z0000 Encounter for general adult medical examination without abnormal findings: Secondary | ICD-10-CM

## 2019-03-08 DIAGNOSIS — Z23 Encounter for immunization: Secondary | ICD-10-CM | POA: Diagnosis not present

## 2019-03-08 DIAGNOSIS — E7800 Pure hypercholesterolemia, unspecified: Secondary | ICD-10-CM | POA: Diagnosis not present

## 2019-03-08 NOTE — Addendum Note (Signed)
Addended by: Shary Decamp B on: 03/08/2019 04:40 PM   Modules accepted: Orders

## 2019-03-08 NOTE — Progress Notes (Signed)
Subjective:    Patient ID: Connie Cisneros, female    DOB: 10-04-1950, 68 y.o.   MRN: [id number]  HPI  Patient is a very pleasant 68 year old Caucasian female here today for complete physical exam.  Since I last saw the patient she has been tentatively diagnosed with rheumatoid arthritis.  She is currently only on meloxicam however she is following up with her otologist this fall to discuss other treatment options.  She still complains of stiffness in all the joints of her body and fatigue.  Meloxicam is not helping sufficiently.  She is due for mammogram which she obtains at her gynecologist.  Her Pap smear was also performed at her gynecologist.  Her bone density test is overdue.  I recommended repeating that this year with her gynecologist given the fact she has been diagnosed with rheumatoid arthritis and may require prednisone.  Colonoscopy was performed last year and was completely clear.  She is not due for repeat colonoscopy for 5 years.  She is due for Prevnar 13.  The remainder of her immunizations are up-to-date except for a flu shot this fall.  She denies any issues with depression, memory loss, or falls.  Unfortunately she continues to suffer with occasional vertigo particular when she turns her head to the right or lies down.  I gave her a handout on self performing Epley maneuvers.  If this does not work, the next step would be a referral to physical therapy for Epley maneuvers. Immunization History  Administered Date(s) Administered  . Influenza,inj,Quad PF,6+ Mos 06/08/2013, 05/26/2017, 05/28/2018  . Influenza-Unspecified 04/20/2012, 05/05/2016, 05/26/2017  . Zoster 08/05/2014    Past Medical History:  Diagnosis Date  . Bruises easily   . Cancer ([place])    melanoma  . 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 GERD (gastroesophageal reflux disease)   . Heart murmur   . History of migraine headaches   . Hypertension   . Left carotid artery stenosis     <20%  . Sinus congestion    Past Surgical History:  Procedure Laterality Date  . TOTAL VAGINAL HYSTERECTOMY     Current Outpatient Medications on File Prior to Visit  Medication Sig Dispense Refill  . atorvastatin (LIPITOR) 40 MG tablet Take 1 tablet (40 mg total) by mouth daily at 6 PM. 90 tablet 3  . Bioflavonoid Products (BIOFLEX) TABS Take 1 tablet by mouth daily.    . carvedilol (COREG) 6.25 MG tablet Take 1 tablet (6.25 mg total) by mouth 2 (two) times daily with a meal. 180 tablet 3  . cholecalciferol (VITAMIN D) 1000 UNITS tablet Take 1,000 Units by mouth daily.    . diazepam (VALIUM) 10 MG tablet Take 1 tablet (10 mg total) by mouth every 12 (twelve) hours as needed for anxiety (Take 1 hour prior to MRI). 2 tablet 0  . estradiol (ESTRACE) 2 MG tablet Take 2 mg by mouth daily.    . fluticasone (FLONASE) 50 MCG/ACT nasal spray Place 2 sprays into both nostrils daily. 16 g 2  . hydrochlorothiazide (MICROZIDE) 12.5 MG capsule TAKE 1 CAPSULE(12.5 MG) BY MOUTH DAILY 90 capsule 3  . levocetirizine (XYZAL) 5 MG tablet Take 1 tablet (5 mg total) by mouth every evening. 30 tablet 0  . meclizine (ANTIVERT) 25 MG tablet Take 1 tablet (25 mg total) by mouth 3 (three) times daily as needed for dizziness. 30 tablet 0  . meloxicam (MOBIC) 15 MG tablet TK 1  T PO QD    . omeprazole (PRILOSEC) 40 MG capsule Take 40 mg by mouth daily.   0  . ondansetron (ZOFRAN) 4 MG tablet Take 4 mg by mouth 3 (three) times daily as needed.  0  . RESTASIS 0.05 % ophthalmic emulsion INSTILL 1 DROP INTO EACH EYE BID.    Marland Kitchen tiZANidine (ZANAFLEX) 4 MG tablet TAKE 4mg  BY MOUTH TWICE A DAY AS NEEDED FOR SPASMS  0  . vitamin B-12 (CYANOCOBALAMIN) 100 MCG tablet Take 100 mcg by mouth daily.    Merril Abbe 10 MCG TABS vaginal tablet Place 10 mcg vaginally as needed.     No current facility-administered medications on file prior to visit.    Allergies  Allergen Reactions  . Losartan     Unclear if it was losartan or  verapamil that caused angioedema, both are stopped  . Verapamil     Stopped by urgent care in 03/22/2016 for possible angioedema, losartan was stopped on 03/25/16 by cardiology as it is a more likely culprit  . Codeine Nausea Only  . Lisinopril Other (See Comments)    Sleepy and tired  . Sulfa Antibiotics Nausea Only  . Wellbutrin [Bupropion] Other (See Comments)    Reaction to losartan and verapamil   Social History   Socioeconomic History  . Marital status: Married    Spouse name: Not on file  . Number of children: Not on file  . Years of education: Not on file  . Highest education level: Not on file  Occupational History  . Not on file  Social Needs  . Financial resource strain: Not on file  . Food insecurity    Worry: Not on file    Inability: Not on file  . Transportation needs    Medical: Not on file    Non-medical: Not on file  Tobacco Use  . Smoking status: Never Smoker  . Smokeless tobacco: Never Used  Substance and Sexual Activity  . Alcohol use: No  . Drug use: No  . Sexual activity: Not on file  Lifestyle  . Physical activity    Days per week: Not on file    Minutes per session: Not on file  . Stress: Not on file  Relationships  . Social Herbalist on phone: Not on file    Gets together: Not on file    Attends religious service: Not on file    Active member of club or organization: Not on file    Attends meetings of clubs or organizations: Not on file    Relationship status: Not on file  . Intimate partner violence    Fear of current or ex partner: Not on file    Emotionally abused: Not on file    Physically abused: Not on file    Forced sexual activity: Not on file  Other Topics Concern  . Not on file  Social History Narrative  . Not on file   Family History  Problem Relation Age of Onset  . Cancer Mother        breast, stomach  . Diabetes Father   . Heart disease Father   . Cancer Sister   . Heart disease Paternal Grandmother   .  Heart disease Paternal Grandfather       Review of Systems  All other systems reviewed and are negative.      Objective:   Physical Exam  Constitutional: She is oriented to person, place, and time. She appears well-developed  and well-nourished. No distress.  HENT:  Head: Normocephalic and atraumatic.  Right Ear: External ear normal.  Left Ear: External ear normal.  Nose: Nose normal.  Mouth/Throat: Oropharynx is clear and moist. No oropharyngeal exudate.  Eyes: Pupils are equal, round, and reactive to light. Conjunctivae are normal.  Neck: Neck supple. No JVD present. No thyromegaly present.  Cardiovascular: Normal rate, regular rhythm and intact distal pulses. Exam reveals no gallop and no friction rub.  Murmur heard. Pulmonary/Chest: Effort normal and breath sounds normal. No respiratory distress. She has no wheezes. She has no rales. She exhibits no tenderness.  Abdominal: Soft. Bowel sounds are normal. She exhibits no distension and no mass. There is no abdominal tenderness. There is no rebound and no guarding.  Musculoskeletal: Normal range of motion.        General: No tenderness, deformity or edema.  Lymphadenopathy:    She has no cervical adenopathy.  Neurological: She is alert and oriented to person, place, and time. She has normal reflexes. No cranial nerve deficit. She exhibits normal muscle tone. Coordination normal.  Skin: Skin is warm. No rash noted. She is not diaphoretic. No erythema. No pallor.  Psychiatric: She has a normal mood and affect. Her behavior is normal. Judgment and thought content normal.  Vitals reviewed.         Assessment & Plan:  The primary encounter diagnosis was General medical exam. Diagnoses of Essential hypertension and Pure hypercholesterolemia were also pertinent to this visit. Physical exam today is normal.  I recommended a mammogram, bone density test, and Pap smear through her gynecologist.  She received Prevnar 13 today.  She will  be due for Pneumovax next year.  Colonoscopy is up-to-date.  Reviewed her lab work with her in detail which is significant for a low HDL of 48 and a mildly elevated blood sugar of 103 but otherwise her labs are normal. Lab on 02/26/2019  Component Date Value Ref Range Status  . Cholesterol 02/26/2019 173  <200 mg/dL Final  . HDL 02/26/2019 48* > OR = 50 mg/dL Final  . Triglycerides 02/26/2019 138  <150 mg/dL Final  . LDL Cholesterol (Calc) 02/26/2019 101* mg/dL (calc) Final   Comment: Reference range: <100 . Desirable range <100 mg/dL for primary prevention;   <70 mg/dL for patients with CHD or diabetic patients  with > or = 2 CHD risk factors. Marland Kitchen LDL-C is now calculated using the Martin-Hopkins  calculation, which is a validated novel method providing  better accuracy than the Friedewald equation in the  estimation of LDL-C.  Cresenciano Genre et al. Annamaria Helling. 6283;151(76): 2061-2068  (http://education.[facility].com/faq/FAQ164)   . Total CHOL/HDL Ratio 02/26/2019 3.6  <5.0 (calc) Final  . Non-HDL Cholesterol (Calc) 02/26/2019 125  <130 mg/dL (calc) Final   Comment: For patients with diabetes plus 1 major ASCVD risk  factor, treating to a non-HDL-C goal of <100 mg/dL  (LDL-C of <70 mg/dL) is considered a therapeutic  option.   . Glucose, Bld 02/26/2019 103* 65 - 99 mg/dL Final   Comment: .            Fasting reference interval . For someone without known diabetes, a glucose value between 100 and 125 mg/dL is consistent with prediabetes and should be confirmed with a follow-up test. .   . BUN 02/26/2019 15  7 - 25 mg/dL Final  . Creat 02/26/2019 0.93  0.50 - 0.99 mg/dL Final   Comment: For patients >32 years of age, the reference limit for Creatinine  is approximately 13% higher for people identified as African-American. .   Havery Moros Ratio 86/57/8469 NOT APPLICABLE  6 - 22 (calc) Final  . Sodium 02/26/2019 137  135 - 146 mmol/L Final  . Potassium 02/26/2019 3.9  3.5 -  5.3 mmol/L Final  . Chloride 02/26/2019 100  98 - 110 mmol/L Final  . CO2 02/26/2019 27  20 - 32 mmol/L Final  . Calcium 02/26/2019 9.7  8.6 - 10.4 mg/dL Final  . Total Protein 02/26/2019 7.4  6.1 - 8.1 g/dL Final  . Albumin 02/26/2019 3.9  3.6 - 5.1 g/dL Final  . Globulin 02/26/2019 3.5  1.9 - 3.7 g/dL (calc) Final  . AG Ratio 02/26/2019 1.1  1.0 - 2.5 (calc) Final  . Total Bilirubin 02/26/2019 0.5  0.2 - 1.2 mg/dL Final  . Alkaline phosphatase (APISO) 02/26/2019 94  37 - 153 U/L Final  . AST 02/26/2019 20  10 - 35 U/L Final  . ALT 02/26/2019 16  6 - 29 U/L Final  . WBC 02/26/2019 4.9  3.8 - 10.8 Thousand/uL Final  . RBC 02/26/2019 4.19  3.80 - 5.10 Million/uL Final  . Hemoglobin 02/26/2019 12.6  11.7 - 15.5 g/dL Final  . HCT 02/26/2019 37.1  35.0 - 45.0 % Final  . MCV 02/26/2019 88.5  80.0 - 100.0 fL Final  . MCH 02/26/2019 30.1  27.0 - 33.0 pg Final  . MCHC 02/26/2019 34.0  32.0 - 36.0 g/dL Final  . RDW 02/26/2019 14.8  11.0 - 15.0 % Final  . Platelets 02/26/2019 256  140 - 400 Thousand/uL Final  . MPV 02/26/2019 12.1  7.5 - 12.5 fL Final  . Neutro Abs 02/26/2019 2,984  1,500 - 7,800 cells/uL Final  . Lymphs Abs 02/26/2019 1,387  850 - 3,900 cells/uL Final  . Absolute Monocytes 02/26/2019 431  200 - 950 cells/uL Final  . Eosinophils Absolute 02/26/2019 69  15 - 500 cells/uL Final  . Basophils Absolute 02/26/2019 29  0 - 200 cells/uL Final  . Neutrophils Relative % 02/26/2019 60.9  % Final  . Total Lymphocyte 02/26/2019 28.3  % Final  . Monocytes Relative 02/26/2019 8.8  % Final  . Eosinophils Relative 02/26/2019 1.4  % Final  . Basophils Relative 02/26/2019 0.6  % Final

## 2019-03-11 DIAGNOSIS — M069 Rheumatoid arthritis, unspecified: Secondary | ICD-10-CM | POA: Insufficient documentation

## 2019-03-12 ENCOUNTER — Encounter: Payer: Self-pay | Admitting: Family Medicine

## 2019-04-05 ENCOUNTER — Other Ambulatory Visit: Payer: Self-pay | Admitting: Neurosurgery

## 2019-05-17 ENCOUNTER — Other Ambulatory Visit: Payer: Self-pay

## 2019-05-18 DIAGNOSIS — Z6835 Body mass index (BMI) 35.0-35.9, adult: Secondary | ICD-10-CM | POA: Insufficient documentation

## 2019-05-20 ENCOUNTER — Other Ambulatory Visit: Payer: Self-pay | Admitting: Cardiovascular Disease

## 2019-05-20 ENCOUNTER — Other Ambulatory Visit: Payer: Self-pay

## 2019-05-20 ENCOUNTER — Ambulatory Visit (INDEPENDENT_AMBULATORY_CARE_PROVIDER_SITE_OTHER): Payer: 59 | Admitting: *Deleted

## 2019-05-20 DIAGNOSIS — Z23 Encounter for immunization: Secondary | ICD-10-CM | POA: Diagnosis not present

## 2019-05-20 NOTE — Telephone Encounter (Signed)
°  Patient has no medication    *STAT* If patient is at the pharmacy, call can be transferred to refill team.   1. Which medications need to be refilled? (please list name of each medication and dose if known) carvedilol (COREG) 6.25 MG tablet,atorvastatin (LIPITOR) 40 MG tablet(Expired)  2. Which pharmacy/location (including street and city if local pharmacy) is medication to be sent to?[place], [place] - [address] AT [place]  3. Do they need a 30 day or 90 day supply? [place]

## 2019-05-20 NOTE — Progress Notes (Signed)
Patient seen in office for Influenza Vaccination.   Tolerated IM administration well.   Immunization history updated.  

## 2019-05-21 MED ORDER — ATORVASTATIN CALCIUM 40 MG PO TABS: 40.0000 mg | ORAL_TABLET | Freq: Every day | ORAL | 0 refills | Status: DC

## 2019-05-21 MED ORDER — CARVEDILOL 6.25 MG PO TABS: 6.2500 mg | ORAL_TABLET | Freq: Two times a day (BID) | ORAL | 0 refills | Status: DC

## 2019-05-21 NOTE — Telephone Encounter (Signed)
LVM letting patient know that I refilled the medications that she requested to ne refilled.

## 2019-06-17 ENCOUNTER — Encounter: Payer: Self-pay | Admitting: Cardiology

## 2019-06-17 ENCOUNTER — Ambulatory Visit: Payer: 59 | Admitting: Cardiology

## 2019-06-17 ENCOUNTER — Other Ambulatory Visit: Payer: Self-pay

## 2019-06-17 VITALS — BP 138/82 | HR 68 | Ht 64.0 in | Wt 198.8 lb

## 2019-06-17 DIAGNOSIS — I6521 Occlusion and stenosis of right carotid artery: Secondary | ICD-10-CM | POA: Diagnosis not present

## 2019-06-17 DIAGNOSIS — E7800 Pure hypercholesterolemia, unspecified: Secondary | ICD-10-CM

## 2019-06-17 DIAGNOSIS — I10 Essential (primary) hypertension: Secondary | ICD-10-CM

## 2019-06-17 DIAGNOSIS — Z8739 Personal history of other diseases of the musculoskeletal system and connective tissue: Secondary | ICD-10-CM

## 2019-06-17 DIAGNOSIS — Z87898 Personal history of other specified conditions: Secondary | ICD-10-CM | POA: Diagnosis not present

## 2019-06-17 NOTE — Progress Notes (Signed)
Cardiology Office Note:    Date:  06/17/2019   ID:  Connie Cisneros, DOB 05-12-1951, MRN [id number]  PCP:  Susy Frizzle, MD  Cardiologist:  Shelva Majestic, MD (Last Ok Edwards 2016) Electrophysiologist:  None   Referring MD: Susy Frizzle, MD   No chief complaint on file. F/U B/P  History of Present Illness:    Connie Cisneros is a 68 y.o. female with a hx of HTN, dyslipidemia, and mild carotid disease by doppler in 2014.  In Aug she had swelling while on Verapamil and Losartan.  Both were stopped but it is suspected she had angioedema from Losartan.  She was switched to Coreg and HCTZ.  Dr Dennard Schaumann has been following her labs.  She presents today for routine follow up.   Since we saw her last she has had C-spine surgery as an OP Sept 2020.  She is also followed by Dr Lenna Gilford for RA.  The patient works for the [employer] from home- 10 hour shifts.  She says she has been unable to get any exercise.  Her weight has gone up from 184l bs Dec 2019 to 198 lbs today.  Her B/P has been running [id number] systolic.   Past Medical History:  Diagnosis Date  . Cancer ([place])    melanoma  . 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 GERD (gastroesophageal reflux disease)   . Heart murmur   . History of migraine headaches   . Hypertension   . Left carotid artery stenosis    <20%    Past Surgical History:  Procedure Laterality Date  . TOTAL VAGINAL HYSTERECTOMY      Current Medications: Current Meds  Medication Sig  . atorvastatin (LIPITOR) 40 MG tablet Take 1 tablet (40 mg total) by mouth daily at 6 PM.  . Bioflavonoid Products (BIOFLEX) TABS Take 1 tablet by mouth daily.  . carvedilol (COREG) 6.25 MG tablet Take 1 tablet (6.25 mg total) by mouth 2 (two) times daily with a meal.  . cholecalciferol (VITAMIN D) 1000 UNITS tablet Take 1,000 Units by mouth daily.  . diazepam (VALIUM) 10 MG tablet Take 1 tablet (10 mg total) by mouth every 12 (twelve) hours as needed for anxiety  (Take 1 hour prior to MRI).  Marland Kitchen diclofenac (VOLTAREN) 75 MG EC tablet Take 75 mg by mouth 2 (two) times daily.  Marland Kitchen estradiol (ESTRACE) 2 MG tablet Take 2 mg by mouth daily.  . fluticasone (FLONASE) 50 MCG/ACT nasal spray Place 2 sprays into both nostrils daily.  . hydrochlorothiazide (MICROZIDE) 12.5 MG capsule TAKE 1 CAPSULE(12.5 MG) BY MOUTH DAILY  . hydroxychloroquine (PLAQUENIL) 200 MG tablet Take 200 mg by mouth 2 (two) times daily.  Marland Kitchen levocetirizine (XYZAL) 5 MG tablet Take 1 tablet (5 mg total) by mouth every evening.  . meclizine (ANTIVERT) 25 MG tablet Take 1 tablet (25 mg total) by mouth 3 (three) times daily as needed for dizziness.  . meloxicam (MOBIC) 15 MG tablet TK 1 T PO QD  . omeprazole (PRILOSEC) 40 MG capsule Take 40 mg by mouth daily.   . ondansetron (ZOFRAN) 4 MG tablet Take 4 mg by mouth 3 (three) times daily as needed.  . RESTASIS 0.05 % ophthalmic emulsion INSTILL 1 DROP INTO EACH EYE BID.  Marland Kitchen tiZANidine (ZANAFLEX) 4 MG tablet TAKE 4mg  BY MOUTH TWICE A DAY AS NEEDED FOR SPASMS  . vitamin B-12 (CYANOCOBALAMIN) 100 MCG tablet Take 100 mcg by mouth  daily.  Connie Cisneros 10 MCG TABS vaginal tablet Place 10 mcg vaginally as needed.     Allergies:   Losartan, Verapamil, Codeine, Lisinopril, Sulfa antibiotics, and Wellbutrin [bupropion]   Social History   Socioeconomic History  . Marital status: Married    Spouse name: Not on file  . Number of children: Not on file  . Years of education: Not on file  . Highest education level: Not on file  Occupational History  . Not on file  Social Needs  . Financial resource strain: Not on file  . Food insecurity    Worry: Not on file    Inability: Not on file  . Transportation needs    Medical: Not on file    Non-medical: Not on file  Tobacco Use  . Smoking status: Never Smoker  . Smokeless tobacco: Never Used  Substance and Sexual Activity  . Alcohol use: No  . Drug use: No  . Sexual activity: Not on file  Lifestyle  .  Physical activity    Days per week: Not on file    Minutes per session: Not on file  . Stress: Not on file  Relationships  . Social Herbalist on phone: Not on file    Gets together: Not on file    Attends religious service: Not on file    Active member of club or organization: Not on file    Attends meetings of clubs or organizations: Not on file    Relationship status: Not on file  Other Topics Concern  . Not on file  Social History Narrative  . Not on file     Family History: The patient's family history includes Cancer in her mother and sister; Diabetes in her father; Heart disease in her father, paternal grandfather, and paternal grandmother.  ROS:   Please see the history of present illness.     All other systems reviewed and are negative.  EKGs/Labs/Other Studies Reviewed:    EKG:  EKG is ordered today.  The ekg ordered today demonstrates [id number]  Recent Labs: 02/26/2019: ALT 16; BUN 15; Creat 0.93; Hemoglobin 12.6; Platelets 256; Potassium 3.9; Sodium 137  Recent Lipid Panel    Component Value Date/Time   CHOL 173 02/26/2019 0835   CHOL 159 07/21/2013 1336   TRIG 138 02/26/2019 0835   TRIG 75 07/21/2013 1336   HDL 48 (L) 02/26/2019 0835   HDL 54 07/21/2013 1336   CHOLHDL 3.6 02/26/2019 0835   VLDL 18 12/03/2016 0812   LDLCALC 101 (H) 02/26/2019 0835   LDLCALC 90 07/21/2013 1336    Physical Exam:    VS:  BP 138/82   Pulse 68   Ht 5\' 4"  (1.626 m)   Wt 198 lb 12.8 oz (90.2 kg)   SpO2 98%   BMI 34.12 kg/m     Wt Readings from Last 3 Encounters:  06/17/19 198 lb 12.8 oz (90.2 kg)  03/08/19 198 lb (89.8 kg)  09/15/18 187 lb (84.8 kg)     GEN: Overweight Caucasian female,well developed in no acute distress HEENT: Normal NECK: No JVD; No carotid bruits LYMPHATICS: No lymphadenopathy CARDIAC: RRR, soft systolic murmur AOV area rubs, gallops RESPIRATORY:  Clear to auscultation without rales, wheezing or rhonchi  ABDOMEN: Soft, non-tender,  non-distended MUSCULOSKELETAL:  No edema; No deformity  SKIN: Warm and dry NEUROLOGIC:  Alert and oriented x 3 PSYCHIATRIC:  Normal affect   ASSESSMENT:    HTN (hypertension) Fair control.  I asked her  to monitor her B/P over the next several weeks.  She will need adjustment in her medication if her systolic is consistently > 135.  We discussed the importance of exercise, weight loss, and low salt diet.   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     Medication Adjustments/Labs and Tests Ordered: Current medicines are reviewed at length with the patient today.  Concerns regarding medicines are outlined above.  Orders Placed This Encounter  Procedures  . EKG 12-Lead   No orders of the defined types were placed in this encounter.   Patient Instructions  Medication Instructions:  Your physician recommends that you continue on your current medications as directed. Please refer to the Current Medication list given to you today. *If you need a refill on your cardiac medications before your next appointment, please call your pharmacy*  Lab Work: None  If you have labs (blood work) drawn today and your tests are completely normal, you will receive your results only by: Marland Kitchen MyChart Message (if you have MyChart) OR . A paper copy in the mail If you have any lab test that is abnormal or we need to change your treatment, we will call you to review the results.  Testing/Procedures: None   Follow-Up: At [facility], you and your health needs are our priority.  As part of our continuing mission to provide you with exceptional heart care, we have created designated Provider Care Teams.  These Care Teams  include your primary Cardiologist (physician) and Advanced Practice Providers (APPs -  Physician Assistants and Nurse Practitioners) who all work together to provide you with the care you need, when you need it.  Your next appointment:   6 months  The format for your next appointment:   In Person  Provider:   Kerin Ransom, PA-C  Other Instructions SALTY 6 HANDOUT GIVEN      Signed, Kerin Ransom, PA-C  06/17/2019 4:05 PM    [facility]

## 2019-06-17 NOTE — Patient Instructions (Signed)
Medication Instructions:  Your physician recommends that you continue on your current medications as directed. Please refer to the Current Medication list given to you today. *If you need a refill on your cardiac medications before your next appointment, please call your pharmacy*  Lab Work: None  If you have labs (blood work) drawn today and your tests are completely normal, you will receive your results only by: Marland Kitchen MyChart Message (if you have MyChart) OR . A paper copy in the mail If you have any lab test that is abnormal or we need to change your treatment, we will call you to review the results.  Testing/Procedures: None   Follow-Up: At [facility], you and your health needs are our priority.  As part of our continuing mission to provide you with exceptional heart care, we have created designated Provider Care Teams.  These Care Teams include your primary Cardiologist (physician) and Advanced Practice Providers (APPs -  Physician Assistants and Nurse Practitioners) who all work together to provide you with the care you need, when you need it.  Your next appointment:   6 months  The format for your next appointment:   In Person  Provider:   Kerin Ransom, PA-C  Other Instructions SALTY 6 HANDOUT GIVEN

## 2019-06-17 NOTE — Assessment & Plan Note (Signed)
On statin Rx- LDL 100 on Lipitor 10 mg

## 2019-06-17 NOTE — Assessment & Plan Note (Signed)
Followed by Dr Hawks 

## 2019-06-17 NOTE — Assessment & Plan Note (Signed)
Fair control.  I asked her to monitor her B/P over the next several weeks.  She will need adjustment in her medication if her systolic is consistently > 135.  We discussed the importance of exercise, weight loss, and low salt diet.

## 2019-06-17 NOTE — Assessment & Plan Note (Signed)
Secondary to ARB

## 2019-06-17 NOTE — Assessment & Plan Note (Signed)
Minimal plaque by doppler at [facility] 2014-no bruit on exam

## 2019-07-15 ENCOUNTER — Other Ambulatory Visit: Payer: Self-pay | Admitting: Family Medicine

## 2019-07-15 MED ORDER — FLUTICASONE PROPIONATE 50 MCG/ACT NA SUSP: 2.0000 | Freq: Every day | NASAL | 2 refills | Status: DC

## 2019-07-23 ENCOUNTER — Other Ambulatory Visit: Payer: Self-pay | Admitting: Podiatry

## 2019-07-23 ENCOUNTER — Ambulatory Visit (INDEPENDENT_AMBULATORY_CARE_PROVIDER_SITE_OTHER): Payer: 59

## 2019-07-23 ENCOUNTER — Encounter: Payer: Self-pay | Admitting: Podiatry

## 2019-07-23 ENCOUNTER — Ambulatory Visit: Payer: 59 | Admitting: Podiatry

## 2019-07-23 ENCOUNTER — Other Ambulatory Visit: Payer: Self-pay

## 2019-07-23 DIAGNOSIS — M05771 Rheumatoid arthritis with rheumatoid factor of right ankle and foot without organ or systems involvement: Secondary | ICD-10-CM

## 2019-07-23 DIAGNOSIS — M05772 Rheumatoid arthritis with rheumatoid factor of left ankle and foot without organ or systems involvement: Secondary | ICD-10-CM | POA: Diagnosis not present

## 2019-07-23 DIAGNOSIS — M722 Plantar fascial fibromatosis: Secondary | ICD-10-CM

## 2019-07-23 DIAGNOSIS — M79672 Pain in left foot: Secondary | ICD-10-CM | POA: Diagnosis not present

## 2019-07-23 DIAGNOSIS — M79671 Pain in right foot: Secondary | ICD-10-CM | POA: Diagnosis not present

## 2019-07-23 NOTE — Progress Notes (Signed)
Subjective:  Patient ID: Connie Cisneros, female    DOB: 02-17-1951,  MRN: [id number]  Chief Complaint  Patient presents with  . Foot Pain    pt is here for bil plantar fasciitis, located on the bottom of both feet, pt also states that pain is elevated when walking as well    68 y.o. female presents with the above complaint.  Patient presents with painful bilateral plantar fasciitis that was treated in the past by Dr. Earleen Newport.  Patient states that they have came back with pain to bilateral heel.  She states is worse when she gets out of the bed and takes a 4 step.  However he still continues to hurt throughout the day as she is ambulating in them.  She has been doing some of her stretching exercises.  She states is been going on since Thanksgiving.  She has some throbbing sensation.  She denies any other acute complaints.  She also has a secondary complaint of bilateral ankle pain as well.  However she states that the heel pain started first followed by aggravation of the ankle.  She has a history of rheumatoid arthritis.   Review of Systems: Negative except as noted in the HPI. Denies N/V/F/Ch.  Past Medical History:  Diagnosis Date  . Cancer ([place])    melanoma  . 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 GERD (gastroesophageal reflux disease)   . Heart murmur   . History of migraine headaches   . Hypertension   . Left carotid artery stenosis    <20%    Current Outpatient Medications:  .  atorvastatin (LIPITOR) 40 MG tablet, Take 1 tablet (40 mg total) by mouth daily at 6 PM., Disp: 90 tablet, Rfl: 0 .  Bioflavonoid Products (BIOFLEX) TABS, Take 1 tablet by mouth daily., Disp: , Rfl:  .  carvedilol (COREG) 6.25 MG tablet, Take 1 tablet (6.25 mg total) by mouth 2 (two) times daily with a meal., Disp: 180 tablet, Rfl: 0 .  cholecalciferol (VITAMIN D) 1000 UNITS tablet, Take 1,000 Units by mouth daily., Disp: , Rfl:  .  diazepam (VALIUM) 10 MG tablet, Take 1 tablet  (10 mg total) by mouth every 12 (twelve) hours as needed for anxiety (Take 1 hour prior to MRI)., Disp: 2 tablet, Rfl: 0 .  diclofenac (VOLTAREN) 75 MG EC tablet, Take 75 mg by mouth 2 (two) times daily., Disp: , Rfl:  .  estradiol (ESTRACE) 0.5 MG tablet, Take 0.5 mg by mouth daily., Disp: , Rfl:  .  estradiol (ESTRACE) 2 MG tablet, Take 2 mg by mouth daily., Disp: , Rfl:  .  fluticasone (FLONASE) 50 MCG/ACT nasal spray, Place 2 sprays into both nostrils daily., Disp: 16 g, Rfl: 2 .  hydrochlorothiazide (MICROZIDE) 12.5 MG capsule, TAKE 1 CAPSULE(12.5 MG) BY MOUTH DAILY, Disp: 90 capsule, Rfl: 3 .  hydroxychloroquine (PLAQUENIL) 200 MG tablet, Take 200 mg by mouth 2 (two) times daily., Disp: , Rfl:  .  levocetirizine (XYZAL) 5 MG tablet, Take 1 tablet (5 mg total) by mouth every evening., Disp: 30 tablet, Rfl: 0 .  meclizine (ANTIVERT) 25 MG tablet, Take 1 tablet (25 mg total) by mouth 3 (three) times daily as needed for dizziness., Disp: 30 tablet, Rfl: 0 .  meloxicam (MOBIC) 15 MG tablet, TK 1 T PO QD, Disp: , Rfl:  .  omeprazole (PRILOSEC) 40 MG capsule, Take 40 mg by mouth daily. , Disp: , Rfl: 0 .  ondansetron (ZOFRAN) 4 MG tablet, Take 4 mg by mouth 3 (three) times daily as needed., Disp: , Rfl: 0 .  RESTASIS 0.05 % ophthalmic emulsion, INSTILL 1 DROP INTO EACH EYE BID., Disp: , Rfl:  .  tiZANidine (ZANAFLEX) 4 MG tablet, TAKE 4mg  BY MOUTH TWICE A DAY AS NEEDED FOR SPASMS, Disp: , Rfl: 0 .  vitamin B-12 (CYANOCOBALAMIN) 100 MCG tablet, Take 100 mcg by mouth daily., Disp: , Rfl:  .  YUVAFEM 10 MCG TABS vaginal tablet, Place 10 mcg vaginally as needed., Disp: , Rfl:   Social History   Tobacco Use  Smoking Status Never Smoker  Smokeless Tobacco Never Used    Allergies  Allergen Reactions  . Losartan     Unclear if it was losartan or verapamil that caused angioedema, both are stopped  . Verapamil     Stopped by urgent care in 03/22/2016 for possible angioedema, losartan was stopped on  03/25/16 by cardiology as it is a more likely culprit  . Codeine Nausea Only  . Lisinopril Other (See Comments)    Sleepy and tired  . Sulfa Antibiotics Nausea Only  . Wellbutrin [Bupropion] Other (See Comments)    Reaction to losartan and verapamil   Objective:  There were no vitals filed for this visit. There is no height or weight on file to calculate BMI. Constitutional Well developed. Well nourished.  Vascular Dorsalis pedis pulses palpable bilaterally. Posterior tibial pulses palpable bilaterally. Capillary refill normal to all digits.  No cyanosis or clubbing noted. Pedal hair growth normal.  Neurologic Normal speech. Oriented to person, place, and time. Epicritic sensation to light touch grossly present bilaterally.  Dermatologic Nails well groomed and normal in appearance. No open wounds. No skin lesions.  Orthopedic: Normal joint ROM without pain or crepitus bilaterally. No visible deformities. Tender to palpation at the calcaneal tuber bilaterally. No pain with calcaneal squeeze bilaterally. Ankle ROM full range of motion bilaterally. Silfverskiold Test: negative bilaterally.  Pain on palpation to the anteromedial gutter of the ankle joint bilaterally.  Pain with range of motion of the ankle dorsiflexion as well as plantarflexion.  No pain at the Achilles tendon the peroneal tendon posterior tibial tendon or ATFL region.   Radiographs: Taken and reviewed. No acute fractures or dislocations. No evidence of stress fracture.  Plantar heel spur present. Posterior heel spur absent.  Mild ankle arthritic changes noted at the anterior portion of the ankle joint.  However the rest of the joint and other joints are in good alignment without any sign of arthritis.  No other bony deformities noted.  Assessment:   1. Plantar fasciitis, right   2. Pain in right foot   3. 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 Rheumatoid arthritis  involving left ankle with positive rheumatoid factor (HCC)   5. Plantar fasciitis of left foot   6. Left foot pain    Plan:  Patient was evaluated and treated and all questions answered.  Plantar Fasciitis, bilaterally - XR reviewed as above.  - Educated on icing and stretching. Instructions given.  - Injection delivered to the plantar fascia as below. - DME: Plantar Fascial Brace x2 - Pharmacologic management: None  Bilateral ankle joint rheumatoid arthritis pain -I explained to the patient the etiology of ankle joint pain with an underlying rheumatoid arthritis.  Even though she does not have a gross changes to the ankle joint I believe that she will benefit from a steroid injection into the  ankle joint to help decrease the acute inflammation is causing her a lot of pain.  Patient agreed with this plan and would like to proceed with injection -A steroid injection was performed at bilateral ankle joint using 1% plain Lidocaine and 10 mg of Kenalog. This was well tolerated.   Procedure: Injection Tendon/Ligament Location: Bilateral plantar fascia at the glabrous junction; medial approach. Skin Prep: alcohol Injectate: 0.5 cc 0.5% marcaine plain, 0.5 cc of 1% Lidocaine, 0.5 cc kenalog 10. Disposition: Patient tolerated procedure well. Injection site dressed with a band-aid.  No follow-ups on file.

## 2019-08-16 ENCOUNTER — Other Ambulatory Visit: Payer: Self-pay

## 2019-08-16 MED ORDER — ATORVASTATIN CALCIUM 40 MG PO TABS: 40.0000 mg | ORAL_TABLET | Freq: Every day | ORAL | 2 refills | Status: DC

## 2019-08-16 MED ORDER — CARVEDILOL 6.25 MG PO TABS: 6.2500 mg | ORAL_TABLET | Freq: Two times a day (BID) | ORAL | 2 refills | Status: DC

## 2019-08-20 ENCOUNTER — Ambulatory Visit: Payer: 59 | Admitting: Podiatry

## 2019-08-20 ENCOUNTER — Encounter: Payer: Self-pay | Admitting: Podiatry

## 2019-08-20 ENCOUNTER — Other Ambulatory Visit: Payer: Self-pay

## 2019-08-20 VITALS — HR 97

## 2019-08-20 DIAGNOSIS — M79671 Pain in right foot: Secondary | ICD-10-CM

## 2019-08-20 DIAGNOSIS — M05772 Rheumatoid arthritis with rheumatoid factor of left ankle and foot without organ or systems involvement: Secondary | ICD-10-CM | POA: Diagnosis not present

## 2019-08-20 DIAGNOSIS — M05771 Rheumatoid arthritis with rheumatoid factor of right ankle and foot without organ or systems involvement: Secondary | ICD-10-CM

## 2019-08-20 DIAGNOSIS — M722 Plantar fascial fibromatosis: Secondary | ICD-10-CM | POA: Diagnosis not present

## 2019-08-20 DIAGNOSIS — M79672 Pain in left foot: Secondary | ICD-10-CM | POA: Diagnosis not present

## 2019-08-20 NOTE — Progress Notes (Signed)
Subjective:  Patient ID: Connie Cisneros, female    DOB: 08-25-1950,  MRN: [id number]  Chief Complaint  Patient presents with  . Plantar Fasciitis    Pt stated, "I don't use the braces much - only when I'm going for a walk (exercise). I do the exercises. I haven't had any issues since I had the injections".    69 y.o. female presents with the above complaint.  Patient is a follow-up for bilateral plantar fasciitis as well as bilateral ankle joint arthritis with a history of rheumatoid arthritis.  Patient states the steroid injection that was given last time has considerably helped.  She states that the plantar fascial braces have helped a lot.  She states that she has been doing her stretching exercises.  She does not have any more pain.  She denies any other acute complaints.  Review of Systems: Negative except as noted in the HPI. Denies N/V/F/Ch.  Past Medical History:  Diagnosis Date  . Cancer ([place])    melanoma  . 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 GERD (gastroesophageal reflux disease)   . Heart murmur   . History of migraine headaches   . Hypertension   . Left carotid artery stenosis    <20%    Current Outpatient Medications:  .  atorvastatin (LIPITOR) 40 MG tablet, Take 1 tablet (40 mg total) by mouth daily at 6 PM., Disp: 90 tablet, Rfl: 2 .  Bioflavonoid Products (BIOFLEX) TABS, Take 1 tablet by mouth daily., Disp: , Rfl:  .  carvedilol (COREG) 6.25 MG tablet, Take 1 tablet (6.25 mg total) by mouth 2 (two) times daily with a meal., Disp: 180 tablet, Rfl: 2 .  cholecalciferol (VITAMIN D) 1000 UNITS tablet, Take 1,000 Units by mouth daily., Disp: , Rfl:  .  diclofenac (VOLTAREN) 75 MG EC tablet, Take 75 mg by mouth 2 (two) times daily., Disp: , Rfl:  .  estradiol (ESTRACE) 2 MG tablet, Take 2 mg by mouth daily., Disp: , Rfl:  .  fluticasone (FLONASE) 50 MCG/ACT nasal spray, Place 2 sprays into both nostrils daily., Disp: 16 g, Rfl: 2 .   hydrochlorothiazide (MICROZIDE) 12.5 MG capsule, TAKE 1 CAPSULE(12.5 MG) BY MOUTH DAILY, Disp: 90 capsule, Rfl: 3 .  hydroxychloroquine (PLAQUENIL) 200 MG tablet, Take 200 mg by mouth 2 (two) times daily., Disp: , Rfl:  .  levocetirizine (XYZAL) 5 MG tablet, Take 1 tablet (5 mg total) by mouth every evening., Disp: 30 tablet, Rfl: 0 .  meclizine (ANTIVERT) 25 MG tablet, Take 1 tablet (25 mg total) by mouth 3 (three) times daily as needed for dizziness., Disp: 30 tablet, Rfl: 0 .  meloxicam (MOBIC) 15 MG tablet, TK 1 T PO QD, Disp: , Rfl:  .  omeprazole (PRILOSEC) 40 MG capsule, Take 40 mg by mouth daily. , Disp: , Rfl: 0 .  ondansetron (ZOFRAN) 4 MG tablet, Take 4 mg by mouth 3 (three) times daily as needed., Disp: , Rfl: 0 .  RESTASIS 0.05 % ophthalmic emulsion, INSTILL 1 DROP INTO EACH EYE BID., Disp: , Rfl:  .  tiZANidine (ZANAFLEX) 4 MG tablet, TAKE 4mg  BY MOUTH TWICE A DAY AS NEEDED FOR SPASMS, Disp: , Rfl: 0 .  vitamin B-12 (CYANOCOBALAMIN) 100 MCG tablet, Take 100 mcg by mouth daily., Disp: , Rfl:  .  YUVAFEM 10 MCG TABS vaginal tablet, Place 10 mcg vaginally as needed., Disp: , Rfl:   Social History   Tobacco  Use  Smoking Status Never Smoker  Smokeless Tobacco Never Used    Allergies  Allergen Reactions  . Losartan     Unclear if it was losartan or verapamil that caused angioedema, both are stopped  . Verapamil     Stopped by urgent care in 03/22/2016 for possible angioedema, losartan was stopped on 03/25/16 by cardiology as it is a more likely culprit  . Codeine Nausea Only  . Lisinopril Other (See Comments)    Sleepy and tired  . Sulfa Antibiotics Nausea Only  . Wellbutrin [Bupropion] Other (See Comments)    Reaction to losartan and verapamil   Objective:   Vitals:   08/20/19 0844  Pulse: 97   There is no height or weight on file to calculate BMI. Constitutional Well developed. Well nourished.  Vascular Dorsalis pedis pulses palpable bilaterally. Posterior tibial  pulses palpable bilaterally. Capillary refill normal to all digits.  No cyanosis or clubbing noted. Pedal hair growth normal.  Neurologic Normal speech. Oriented to person, place, and time. Epicritic sensation to light touch grossly present bilaterally.  Dermatologic Nails well groomed and normal in appearance. No open wounds. No skin lesions.  Orthopedic: Normal joint ROM without pain or crepitus bilaterally. No visible deformities. Tender to palpation at the calcaneal tuber bilaterally. No pain with calcaneal squeeze bilaterally. Ankle ROM full range of motion bilaterally. Silfverskiold Test: negative bilaterally.  No pain on palpation to the anteromedial gutter of the ankle joint bilaterally.  No pain with range of motion of the ankle dorsiflexion as well as plantarflexion.  No pain at the Achilles tendon the peroneal tendon posterior tibial tendon or ATFL region.   Radiographs: None  Assessment:   1. Plantar fasciitis, right   2. Pain in right foot   3. 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 Rheumatoid arthritis involving left ankle with positive rheumatoid factor (HCC)   5. Plantar fasciitis of left foot   6. Left foot pain    Plan:  Patient was evaluated and treated and all questions answered.  Plantar Fasciitis, bilaterally - XR reviewed as above.  - Educated on icing and stretching. Instructions given.  -I will hold off on giving her any further injection as her pain has completely resolved. - DME: Night splint x 2  - Pharmacologic management: None  Bilateral ankle joint rheumatoid arthritis pain -Resolved with the steroid injection  Flexible pes planus deformity -I explained to the patient the etiology and various treatment options associated with pes planus deformity with this relationship with a plantar fasciitis to bilateral lower extremity, I believe patient will benefit from a custom made orthotics to help  control the hindfoot motion as well as support the arch of the foot. -Patient will be scheduled to see Liliane Channel for custom-made orthotics.   Procedure: Injection Tendon/Ligament Location: Bilateral plantar fascia at the glabrous junction; medial approach. Skin Prep: alcohol Injectate: 0.5 cc 0.5% marcaine plain, 0.5 cc of 1% Lidocaine, 0.5 cc kenalog 10. Disposition: Patient tolerated procedure well. Injection site dressed with a band-aid.  Return in about 1 week (around 08/27/2019) for Sched with Liliane Channel for [employer].

## 2019-09-03 ENCOUNTER — Other Ambulatory Visit: Payer: 59 | Admitting: Orthotics

## 2019-09-03 ENCOUNTER — Other Ambulatory Visit: Payer: Self-pay

## 2019-09-03 DIAGNOSIS — M722 Plantar fascial fibromatosis: Secondary | ICD-10-CM | POA: Diagnosis not present

## 2019-09-03 DIAGNOSIS — M05772 Rheumatoid arthritis with rheumatoid factor of left ankle and foot without organ or systems involvement: Secondary | ICD-10-CM | POA: Diagnosis not present

## 2019-09-03 DIAGNOSIS — M05771 Rheumatoid arthritis with rheumatoid factor of right ankle and foot without organ or systems involvement: Secondary | ICD-10-CM | POA: Diagnosis not present

## 2019-09-12 ENCOUNTER — Other Ambulatory Visit: Payer: Self-pay | Admitting: Family Medicine

## 2019-10-01 ENCOUNTER — Encounter: Payer: 59 | Admitting: Orthotics

## 2019-10-01 ENCOUNTER — Other Ambulatory Visit: Payer: Self-pay

## 2019-12-16 ENCOUNTER — Encounter: Payer: Self-pay | Admitting: *Deleted

## 2019-12-17 ENCOUNTER — Other Ambulatory Visit: Payer: Self-pay

## 2019-12-17 ENCOUNTER — Encounter: Payer: Self-pay | Admitting: Physician Assistant

## 2019-12-17 ENCOUNTER — Ambulatory Visit: Payer: 59 | Admitting: Physician Assistant

## 2019-12-17 DIAGNOSIS — Z1283 Encounter for screening for malignant neoplasm of skin: Secondary | ICD-10-CM | POA: Diagnosis not present

## 2019-12-17 DIAGNOSIS — Z86018 Personal history of other benign neoplasm: Secondary | ICD-10-CM

## 2019-12-17 DIAGNOSIS — D229 Melanocytic nevi, unspecified: Secondary | ICD-10-CM | POA: Diagnosis not present

## 2019-12-17 DIAGNOSIS — L814 Other melanin hyperpigmentation: Secondary | ICD-10-CM

## 2019-12-17 DIAGNOSIS — D1801 Hemangioma of skin and subcutaneous tissue: Secondary | ICD-10-CM

## 2019-12-17 DIAGNOSIS — L578 Other skin changes due to chronic exposure to nonionizing radiation: Secondary | ICD-10-CM

## 2019-12-17 DIAGNOSIS — Z86006 Personal history of melanoma in-situ: Secondary | ICD-10-CM | POA: Diagnosis not present

## 2019-12-17 DIAGNOSIS — L821 Other seborrheic keratosis: Secondary | ICD-10-CM

## 2019-12-17 NOTE — Progress Notes (Signed)
   Follow-Up Visit   Subjective  Connie Cisneros is a 69 y.o. female who presents for the following: Annual Exam (Here this morning for her yearly look over. Feels like over the past year she has been having an issue with her great toenails, bilaterally. Feels like they are brittle and lifting from the nail bed. Has noticed some discoloration when she has the polished removed. Also, has noticed increased dryness over the past year. 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   The following portions of the chart were reviewed this encounter and updated as appropriate:     Objective  Well appearing patient in no apparent distress; mood and affect are within normal limits.  A full examination was performed including scalp, head, eyes, ears, nose, lips, neck, chest, axillae, abdomen, back, buttocks, bilateral upper extremities, bilateral lower extremities, hands, feet, fingers, toes, fingernails, and toenails. All findings within normal limits unless otherwise noted below.  Objective  Right Abdomen (side) - Upper: All scars clear  Objective  Left Lower Leg - Anterior: Scars clear   Assessment & Plan  History of dysplastic nevus Right Abdomen (side) - Upper  Yearly skin exam  Personal history of melanoma in-situ Left Lower Leg - Anterior  Yearly skin exams  Screening exam for skin cancer Head - toe  Lentigines - Scattered tan macules - Discussed due to sun exposure - Benign, observe - Call for any changes  Seborrheic Keratoses - Stuck-on, waxy, tan-brown papules and plaques  - Discussed benign etiology and prognosis. - Observe - Call for any changes  Melanocytic Nevi - Tan-brown and/or pink-flesh-colored symmetric macules and papules - Benign appearing on exam today - Observation - Call clinic for new or changing moles - Recommend daily use of broad spectrum spf 30+ sunscreen to sun-exposed areas.   Hemangiomas - Red papules - Discussed benign nature -  Observe - Call for any changes  Actinic Damage - diffuse scaly erythematous macules with underlying dyspigmentation - Recommend daily broad spectrum sunscreen SPF 30+ to sun-exposed areas, reapply every 2 hours as needed.  - Call for new or changing lesions.  Skin cancer screening performed today.

## 2020-01-11 ENCOUNTER — Encounter: Payer: Self-pay | Admitting: Medical

## 2020-01-11 ENCOUNTER — Telehealth (INDEPENDENT_AMBULATORY_CARE_PROVIDER_SITE_OTHER): Payer: 59 | Admitting: Medical

## 2020-01-11 VITALS — BP 133/83 | HR 68 | Ht 64.0 in | Wt 203.0 lb

## 2020-01-11 DIAGNOSIS — R5383 Other fatigue: Secondary | ICD-10-CM

## 2020-01-11 DIAGNOSIS — I6521 Occlusion and stenosis of right carotid artery: Secondary | ICD-10-CM

## 2020-01-11 DIAGNOSIS — R0600 Dyspnea, unspecified: Secondary | ICD-10-CM | POA: Diagnosis not present

## 2020-01-11 DIAGNOSIS — I10 Essential (primary) hypertension: Secondary | ICD-10-CM | POA: Diagnosis not present

## 2020-01-11 DIAGNOSIS — R0609 Other forms of dyspnea: Secondary | ICD-10-CM

## 2020-01-11 DIAGNOSIS — R0602 Shortness of breath: Secondary | ICD-10-CM

## 2020-01-11 DIAGNOSIS — E7800 Pure hypercholesterolemia, unspecified: Secondary | ICD-10-CM

## 2020-01-11 NOTE — Progress Notes (Signed)
Virtual Visit via Telephone Note   This visit type was conducted due to national recommendations for restrictions regarding the COVID-19 Pandemic (e.g. social distancing) in an effort to limit this patient's exposure and mitigate transmission in our community.  Due to her co-morbid illnesses, this patient is at least at moderate risk for complications without adequate follow up.  This format is felt to be most appropriate for this patient at this time.  The patient did not have access to video technology/had technical difficulties with video requiring transitioning to audio format only (telephone).  All issues noted in this document were discussed and addressed.  No physical exam could be performed with this format.  Please refer to the patient's chart for her  consent to telehealth for [facility].   Date:  01/16/2020   ID:  Connie Cisneros, DOB 1951/02/15, MRN [id number]  Patient Location: Home Provider Location: Office  PCP:  Susy Frizzle, MD  Cardiologist:  Shelva Majestic, MD  Electrophysiologist:  None   Evaluation Performed:  Follow-Up Visit  Chief Complaint:  DOE  History of Present Illness:    Connie Cisneros is a 69 y.o. female with PMH of HTN, HLD, mild carotid artery stenosis on dopplers in 2014, and RA, who presents with complaints of DOE.  She was last evaluated by cardiology at an outpatient visit with Kerin Ransom, PA-C 06/2019, at which time she she had fair blood pressure control and was recommended to increase her activity to promote weight loss. No medication changes occurred at that visit and she was recommended to follow-up with Dr. Claiborne Billings in 6 months. Her last echocardiogram in 2014 showed EF 60-65%, normal LV diastolic function, no RWMA, and no significant valvular abnormalities.   She presents today via a telephone call. She reports recently she has notice significant exertional fatigue. Went to [employer] last week and struggled to go for walks or up stairs.  She had DOE which she thinks is occurring with less activity than previously. She has been mostly sedentary working from home over the past year. Also with RA which she wonders if this is contributing. She has occasional spells of vertigo which often last for 10 days at a time; thankfully these are rare occurrences. She has some mild ankle edema at the end of the day which is resolved on waking in the mornings. She has chronic rare palpitations which last for a seconds and have not changed in frequency/duration recently. No complaints of chest pain at rest or with activity. No syncope, orthopnea, or PND. Her father had a CABG in his 22s, otherwise no family history of CAD.   The patient does not have symptoms concerning for COVID-19 infection (fever, chills, cough, or new shortness of breath).    Past Medical History:  Diagnosis Date  . Atypical nevus 10/09/2006   slight-mderate-right upper thigh  . Atypical nevus 01/24/2015   moderate-left back  . Atypical nevus 02/22/2016   severe-right abdomen (WS)  . Atypical nevus 02/22/2016   mild-right low abdomen (WS)  . Cancer ([place])    melanoma  . 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 GERD (gastroesophageal reflux disease)   . Heart murmur   . History of migraine headaches   . Hypertension   . Left carotid artery stenosis    <20%  . 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 Psoriatic arthritis ([place])   . Rheumatoid aortitis  Past Surgical History:  Procedure Laterality Date  . TOTAL VAGINAL HYSTERECTOMY       Current Meds  Medication Sig  . atorvastatin (LIPITOR) 40 MG tablet Take 1 tablet (40 mg total) by mouth daily at 6 PM.  . carvedilol (COREG) 6.25 MG tablet Take 1 tablet (6.25 mg total) by mouth 2 (two) times daily with a meal.  . cholecalciferol (VITAMIN D) 1000 UNITS tablet Take 1,000 Units by mouth daily.  . diclofenac (VOLTAREN) 75 MG EC tablet Take 75 mg by mouth  2 (two) times daily.  Marland Kitchen estradiol (ESTRACE) 2 MG tablet Take 2 mg by mouth daily.  . fluticasone (FLONASE) 50 MCG/ACT nasal spray Place 2 sprays into both nostrils daily.  . hydrochlorothiazide (MICROZIDE) 12.5 MG capsule TAKE 1 CAPSULE(12.5 MG) BY MOUTH DAILY  . hydroxychloroquine (PLAQUENIL) 200 MG tablet Take 200 mg by mouth 2 (two) times daily.  Marland Kitchen levocetirizine (XYZAL) 5 MG tablet Take 1 tablet (5 mg total) by mouth every evening.  . meclizine (ANTIVERT) 25 MG tablet Take 1 tablet (25 mg total) by mouth 3 (three) times daily as needed for dizziness.  Marland Kitchen omeprazole (PRILOSEC) 40 MG capsule Take 40 mg by mouth daily.   . RESTASIS 0.05 % ophthalmic emulsion INSTILL 1 DROP INTO EACH EYE BID.  Marland Kitchen vitamin B-12 (CYANOCOBALAMIN) 100 MCG tablet Take 100 mcg by mouth daily.  Merril Abbe 10 MCG TABS vaginal tablet Place 10 mcg vaginally as needed.     Allergies:   Losartan, Verapamil, Codeine, Lisinopril, Sulfa antibiotics, and Wellbutrin [bupropion]   Social History   Tobacco Use  . Smoking status: Never Smoker  . Smokeless tobacco: Never Used  Vaping Use  . Vaping Use: Never used  Substance Use Topics  . Alcohol use: No  . Drug use: Never     Family Hx: The patient's family history includes Cancer in her mother and sister; Diabetes in her father; Heart disease in her father, paternal grandfather, and paternal grandmother.  ROS:   Please see the history of present illness.     All other systems reviewed and are negative.   Prior CV studies:   The following studies were reviewed today:  Echocardiogram 2014: - Left ventricle: The cavity size was normal. Wall thickness  was normal. Systolic function was normal. The estimated  ejection fraction was in the range of 60% to 65%. Wall  motion was normal; there were no regional wall motion  abnormalities. Left ventricular diastolic function  parameters were normal.  - Mitral valve: Mildly thickened leaflets . No significant    regurgitation.  - Left atrium: LA Volume/ BSA = 21.5 ml/m2 The atrium was  normal in size.  - Atrial septum: No defect or patent foramen ovale was  identified.  - Inferior vena cava: The vessel was normal in size; the  respirophasic diameter changes were in the normal range (=  50%); findings are consistent with normal central venous  pressure.  - Pericardium, extracardiac: There was no pericardial  effusion.    Labs/Other Tests and Data Reviewed:    EKG:  No ECG reviewed.  Recent Labs: 02/26/2019: ALT 16; BUN 15; Creat 0.93; Hemoglobin 12.6; Platelets 256; Potassium 3.9; Sodium 137   Recent Lipid Panel Lab Results  Component Value Date/Time   CHOL 173 02/26/2019 08:35 AM   CHOL 159 07/21/2013 01:36 PM   TRIG 138 02/26/2019 08:35 AM   TRIG 75 07/21/2013 01:36 PM   HDL 48 (L) 02/26/2019 08:35 AM   HDL  54 07/21/2013 01:36 PM   CHOLHDL 3.6 02/26/2019 08:35 AM   LDLCALC 101 (H) 02/26/2019 08:35 AM   LDLCALC 90 07/21/2013 01:36 PM    Wt Readings from Last 3 Encounters:  01/11/20 203 lb (92.1 kg)  06/17/19 198 lb 12.8 oz (90.2 kg)  03/08/19 198 lb (89.8 kg)     Objective:    Vital Signs:  BP 133/83   Pulse 68   Ht 5\' 4"  (1.626 m)   Wt 203 lb (92.1 kg)   BMI 34.84 kg/m    VITAL SIGNS:  reviewed GEN:  no acute distress CARDIOVASCULAR:  no peripheral edema NEURO:  alert and oriented x 3, no obvious focal deficit PSYCH:  normal affect  ASSESSMENT & PLAN:    1. Fatigue/DOE: patient has had progressive fatigue and DOE over the past several months. ?anginal equivalent. Risk factors for CAD include HTN, HLD, and obesity.  - Will plan for a coronary CTA to define coronary anatomy - Will update an echocardiogram to evaluate LV function and valvular function  2. HTN: BP 133/83 toay - Continue carvedilol and HCTZ  3. HLD: No recent lipids - Will check a FLP when she returns for pre-CT blood work - Continue atorvastatin  4. Carotid artery disease:  minimal disease noted on dopplers in 2014. No symptoms to suggest progression and no bruits on her last outpatient exam 06/2019.  - Continue atorvastatin   Time:   Today, I have spent 14 minutes with the patient with telehealth technology discussing the above problems.     Medication Adjustments/Labs and Tests Ordered: Current medicines are reviewed at length with the patient today.  Concerns regarding medicines are outlined above.   Tests Ordered: Orders Placed This Encounter  Procedures  . CT CORONARY MORPH W/CTA COR W/SCORE W/CA W/CM &/OR WO/CM  . CT CORONARY FRACTIONAL FLOW RESERVE DATA PREP  . CT CORONARY FRACTIONAL FLOW RESERVE FLUID ANALYSIS  . Basic Metabolic Panel (BMET)  . Lipid panel  . ECHOCARDIOGRAM COMPLETE    Medication Changes: No orders of the defined types were placed in this encounter.   Follow Up:  In Person in 6 month(s) with Dr. Claiborne Billings  Signed, Abigail Butts, PA-C  01/16/2020 10:15 PM    [place]

## 2020-01-11 NOTE — Patient Instructions (Signed)
Medication Instructions:  Continue current medications  *If you need a refill on your cardiac medications before your next appointment, please call your pharmacy*   Lab Work: Fasting Lipid and BMP  If you have labs (blood work) drawn today and your tests are completely normal, you will receive your results only by: Marland Kitchen MyChart Message (if you have MyChart) OR . A paper copy in the mail If you have any lab test that is abnormal or we need to change your treatment, we will call you to review the results.   Testing/Procedures: Non-Cardiac CT Angiography (CTA), is a special type of CT scan that uses a computer to produce multi-dimensional views of major blood vessels throughout the body. In CT angiography, a contrast material is injected through an IV to help visualize the blood vessels  Your physician has requested that you have an echocardiogram. Echocardiography is a painless test that uses sound waves to create images of your heart. It provides your doctor with information about the size and shape of your heart and how well your heart's chambers and valves are working. This procedure takes approximately one hour. There are no restrictions for this procedure.  Follow-Up: At [facility], you and your health needs are our priority.  As part of our continuing mission to provide you with exceptional heart care, we have created designated Provider Care Teams.  These Care Teams include your primary Cardiologist (physician) and Advanced Practice Providers (APPs -  Physician Assistants and Nurse Practitioners) who all work together to provide you with the care you need, when you need it.  We recommend signing up for the patient portal called "MyChart".  Sign up information is provided on this After Visit Summary.  MyChart is used to connect with patients for Virtual Visits (Telemedicine).  Patients are able to view lab/test results, encounter notes, upcoming appointments, etc.  Non-urgent messages can  be sent to your provider as well.   To learn more about what you can do with MyChart, go to [email or web address].    Your next appointment:   6 month(s)  The format for your next appointment:   In Person  Provider:   You may see Shelva Majestic, MD or one of the following Advanced Practice Providers on your designated Care Team:    Almyra Deforest, PA-C  Fabian Sharp, PA-C or   Roby Lofts, [place]

## 2020-01-14 ENCOUNTER — Encounter: Payer: Self-pay | Admitting: Medical

## 2020-01-31 ENCOUNTER — Telehealth: Payer: Self-pay | Admitting: *Deleted

## 2020-01-31 LAB — BASIC METABOLIC PANEL
BUN/Creatinine Ratio: 11 — ABNORMAL LOW (ref 12–28)
BUN: 10 mg/dL (ref 8–27)
CO2: 23 mmol/L (ref 20–29)
Calcium: 9 mg/dL (ref 8.7–10.3)
Chloride: 106 mmol/L (ref 96–106)
Creatinine, Ser: 0.91 mg/dL (ref 0.57–1.00)
GFR calc Af Amer: 76 mL/min/{1.73_m2} (ref >59)
GFR calc non Af Amer: 66 mL/min/{1.73_m2} (ref >59)
Glucose: 97 mg/dL (ref 65–99)
Potassium: 4.2 mmol/L (ref 3.5–5.2)
Sodium: 137 mmol/L (ref 134–144)

## 2020-01-31 LAB — LIPID PANEL
Chol/HDL Ratio: 3.4 ratio (ref 0.0–4.4)
Cholesterol, Total: 162 mg/dL (ref 100–199)
HDL: 47 mg/dL (ref >39)
LDL Chol Calc (NIH): 88 mg/dL (ref 0–99)
Triglycerides: 154 mg/dL — ABNORMAL HIGH (ref 0–149)
VLDL Cholesterol Cal: 27 mg/dL (ref 5–40)

## 2020-01-31 MED ORDER — METOPROLOL TARTRATE 100 MG PO TABS: 100.0000 mg | ORAL_TABLET | Freq: Once | ORAL | 0 refills | Status: DC

## 2020-01-31 MED ORDER — METOPROLOL TARTRATE 50 MG PO TABS: 50.0000 mg | ORAL_TABLET | Freq: Once | ORAL | 0 refills | Status: DC

## 2020-01-31 NOTE — Telephone Encounter (Signed)
Your cardiac CT will be scheduled at one of the below locations:   [facility] [address] [place], [place] [address] [phone]  [facility] [address] [place], [place] [address] [phone]  If scheduled at [facility], please arrive at the [facility] main entrance of [facility] 30 minutes prior to test start time. Proceed to the [facility] Radiology Department (first floor) to check-in and test prep.  If scheduled at [facility], please arrive 15 mins early for check-in and test prep.  Please follow these instructions carefully (unless otherwise directed):   On the Night Before the Test: . Be sure to Drink plenty of water. . Do not consume any caffeinated/decaffeinated beverages or chocolate 12 hours prior to your test. . Do not take any antihistamines 12 hours prior to your test.   On the Day of the Test: . Drink plenty of water. Do not drink any water within one hour of the test. . Do not eat any food 4 hours prior to the test. . You may take your regular medications prior to the test.  . Take metoprolol (Lopressor) two hours prior to test. . HOLD Furosemide/Hydrochlorothiazide morning of the test. . FEMALES- please wear underwire-free bra if available        After the Test: . Drink plenty of water. . After receiving IV contrast, you may experience a mild flushed feeling. This is normal. . On occasion, you may experience a mild rash up to 24 hours after the test. This is not dangerous. If this occurs, you can take Benadryl 25 mg and increase your fluid intake. . If you experience trouble breathing, this can be serious. If it is severe call 911 IMMEDIATELY. If it is mild, please call our office. . If you take any of these medications: Glipizide/Metformin, Avandament, Glucavance, please do not take 48 hours after completing test unless otherwise  instructed.   Once we have confirmed authorization from your insurance company, we will call you to set up a date and time for your test.   For non-scheduling related questions, please contact the cardiac imaging nurse navigator should you have any questions/concerns: Marchia Bond, Cardiac Imaging Nurse Navigator Burley Saver, Interim Cardiac Imaging Nurse [place] [facility] Direct Office Dial: [phone]   For scheduling needs, including cancellations and rescheduling, please call [phone].

## 2020-01-31 NOTE — Telephone Encounter (Signed)
Rx change from 100 mg to 50 mg as per Roby Lofts, pharmacy made aware changes

## 2020-02-02 ENCOUNTER — Other Ambulatory Visit: Payer: Self-pay | Admitting: Medical

## 2020-02-03 ENCOUNTER — Telehealth (HOSPITAL_COMMUNITY): Payer: Self-pay | Admitting: *Deleted

## 2020-02-03 NOTE — Telephone Encounter (Signed)
Attempted to call patient regarding upcoming cardiac CT appointment. Left message on voicemail with name and callback number  [facility] RN Navigator Cardiac [facility] [phone] Office [phone] Cell

## 2020-02-04 ENCOUNTER — Other Ambulatory Visit: Payer: Self-pay

## 2020-02-04 ENCOUNTER — Ambulatory Visit (HOSPITAL_COMMUNITY)
Admission: RE | Admit: 2020-02-04 | Discharge: 2020-02-04 | Disposition: A | Discharge status: Home / Self Care | Payer: 59 | Source: Ambulatory Visit | Attending: Medical | Admitting: Medical

## 2020-02-04 ENCOUNTER — Ambulatory Visit (HOSPITAL_BASED_OUTPATIENT_CLINIC_OR_DEPARTMENT_OTHER): Payer: 59

## 2020-02-04 DIAGNOSIS — R0600 Dyspnea, unspecified: Secondary | ICD-10-CM

## 2020-02-04 DIAGNOSIS — R0602 Shortness of breath: Secondary | ICD-10-CM | POA: Diagnosis not present

## 2020-02-04 DIAGNOSIS — E669 Obesity, unspecified: Secondary | ICD-10-CM | POA: Diagnosis not present

## 2020-02-04 DIAGNOSIS — R0609 Other forms of dyspnea: Secondary | ICD-10-CM

## 2020-02-04 DIAGNOSIS — I10 Essential (primary) hypertension: Secondary | ICD-10-CM | POA: Diagnosis not present

## 2020-02-04 DIAGNOSIS — I700 Atherosclerosis of aorta: Secondary | ICD-10-CM | POA: Insufficient documentation

## 2020-02-04 DIAGNOSIS — R5383 Other fatigue: Secondary | ICD-10-CM | POA: Diagnosis not present

## 2020-02-04 MED ORDER — NITROGLYCERIN 0.4 MG SL SUBL
0.8000 mg | SUBLINGUAL_TABLET | Freq: Once | SUBLINGUAL | Status: AC
  Administered 2020-02-04: 0.8 mg via SUBLINGUAL

## 2020-02-04 MED ORDER — IOHEXOL 350 MG/ML SOLN
80.0000 mL | Freq: Once | INTRAVENOUS | Status: AC | PRN
  Administered 2020-02-04: 80 mL via INTRAVENOUS

## 2020-02-04 MED ORDER — PERFLUTREN LIPID MICROSPHERE
1.0000 mL | INTRAVENOUS | Status: AC | PRN
  Administered 2020-02-04: 2 mL via INTRAVENOUS

## 2020-02-04 MED ORDER — NITROGLYCERIN 0.4 MG SL SUBL
SUBLINGUAL_TABLET | SUBLINGUAL | Status: AC
  Filled 2020-02-04: qty 2

## 2020-02-04 MED ORDER — METOPROLOL TARTRATE 5 MG/5ML IV SOLN: 5.0000 mg | Freq: Once | INTRAVENOUS | Status: DC

## 2020-02-04 MED ORDER — METOPROLOL TARTRATE 5 MG/5ML IV SOLN
INTRAVENOUS | Status: AC
  Administered 2020-02-04: 5 mg
  Filled 2020-02-04: qty 5

## 2020-03-06 DIAGNOSIS — K589 Irritable bowel syndrome without diarrhea: Secondary | ICD-10-CM | POA: Insufficient documentation

## 2020-03-08 DIAGNOSIS — C439 Malignant melanoma of skin, unspecified: Secondary | ICD-10-CM | POA: Insufficient documentation

## 2020-04-04 ENCOUNTER — Other Ambulatory Visit: Payer: 59

## 2020-04-04 ENCOUNTER — Other Ambulatory Visit: Payer: Self-pay

## 2020-04-04 DIAGNOSIS — Z0000 Encounter for general adult medical examination without abnormal findings: Secondary | ICD-10-CM

## 2020-04-04 DIAGNOSIS — I10 Essential (primary) hypertension: Secondary | ICD-10-CM

## 2020-04-04 DIAGNOSIS — E7800 Pure hypercholesterolemia, unspecified: Secondary | ICD-10-CM

## 2020-04-07 ENCOUNTER — Other Ambulatory Visit: Payer: Self-pay

## 2020-04-07 ENCOUNTER — Ambulatory Visit (INDEPENDENT_AMBULATORY_CARE_PROVIDER_SITE_OTHER): Payer: 59 | Admitting: Family Medicine

## 2020-04-07 VITALS — BP 120/80 | HR 76 | Temp 97.8°F | Ht 64.0 in

## 2020-04-07 DIAGNOSIS — E7800 Pure hypercholesterolemia, unspecified: Secondary | ICD-10-CM | POA: Diagnosis not present

## 2020-04-07 DIAGNOSIS — Z8739 Personal history of other diseases of the musculoskeletal system and connective tissue: Secondary | ICD-10-CM | POA: Diagnosis not present

## 2020-04-07 DIAGNOSIS — Z23 Encounter for immunization: Secondary | ICD-10-CM

## 2020-04-07 DIAGNOSIS — I10 Essential (primary) hypertension: Secondary | ICD-10-CM

## 2020-04-07 DIAGNOSIS — Z0001 Encounter for general adult medical examination with abnormal findings: Secondary | ICD-10-CM

## 2020-04-07 DIAGNOSIS — R5382 Chronic fatigue, unspecified: Secondary | ICD-10-CM

## 2020-04-07 DIAGNOSIS — Z0000 Encounter for general adult medical examination without abnormal findings: Secondary | ICD-10-CM

## 2020-04-07 NOTE — Addendum Note (Signed)
Addended by: Saundra Shelling on: 04/07/2020 10:28 AM   Modules accepted: Orders

## 2020-04-07 NOTE — Progress Notes (Signed)
Subjective:    Patient ID: Connie Cisneros, female    DOB: [birth year], 69 y.o.   MRN: [id number]  HPI  Patient is a very pleasant 69 year old Caucasian female here today for complete physical exam.  Since I last saw the patient she has been tentatively diagnosed with rheumatoid arthritis.  She is currently only on meloxicam however she is following up with her otologist this fall to discuss other treatment options.  She still complains of stiffness in all the joints of her body and fatigue.  Meloxicam is not helping sufficiently.  She is due for mammogram which she obtains at her gynecologist.  Her Pap smear was also performed at her gynecologist.  Her bone density test is overdue.  I recommended repeating that this year with her gynecologist given the fact she has been diagnosed with rheumatoid arthritis and may require prednisone.  Colonoscopy was performed 2019 and was completely clear.  She is not due for repeat colonoscopy until 2024.  Patient has gained significant fluid weight over the last year.  Her arms and her legs are swollen.  She also reports fatigue.  She reports decreased stamina.  She reports shortness of breath with activity.  She saw her cardiologist who performed a CT angiogram.  There was no evidence of coronary artery disease.  CT scan of the lungs was also completely normal.  She does have some mild anemia Lab on 04/04/2020  Component Date Value Ref Range Status   Cholesterol 04/04/2020 160  <200 mg/dL Final   HDL 04/04/2020 49* > OR = 50 mg/dL Final   Triglycerides 04/04/2020 218* <150 mg/dL Final   Comment: . If a non-fasting specimen was collected, consider repeat triglyceride testing on a fasting specimen if clinically indicated.  Yates Decamp et al. J. of Clin. Lipidol. 3419;6:222-979. Marland Kitchen    LDL Cholesterol (Calc) 04/04/2020 79  mg/dL (calc) Final   Comment: Reference range: <100 . Desirable range <100 mg/dL for primary prevention;   <70 mg/dL for patients with  CHD or diabetic patients  with > or = 2 CHD risk factors. Marland Kitchen LDL-C is now calculated using the Martin-Hopkins  calculation, which is a validated novel method providing  better accuracy than the Friedewald equation in the  estimation of LDL-C.  Cresenciano Genre et al. Annamaria Helling. 8921;194(17): 2061-2068  (http://education.[facility].com/faq/FAQ164)    Total CHOL/HDL Ratio 04/04/2020 3.3  <5.0 (calc) Final   Non-HDL Cholesterol (Calc) 04/04/2020 111  <130 mg/dL (calc) Final   Comment: For patients with diabetes plus 1 major ASCVD risk  factor, treating to a non-HDL-C goal of <100 mg/dL  (LDL-C of <70 mg/dL) is considered a therapeutic  option.    WBC 04/04/2020 3.9  3.8 - 10.8 Thousand/uL Final   RBC 04/04/2020 3.83  3.80 - 5.10 Million/uL Final   Hemoglobin 04/04/2020 11.5* 11.7 - 15.5 g/dL Final   HCT 04/04/2020 35.1  35 - 45 % Final   MCV 04/04/2020 91.6  80.0 - 100.0 fL Final   MCH 04/04/2020 30.0  27.0 - 33.0 pg Final   MCHC 04/04/2020 32.8  32.0 - 36.0 g/dL Final   RDW 04/04/2020 13.9  11.0 - 15.0 % Final   Platelets 04/04/2020 195  140 - 400 Thousand/uL Final   MPV 04/04/2020 13.4* 7.5 - 12.5 fL Final   Neutro Abs 04/04/2020 2,133  1,500 - 7,800 cells/uL Final   Lymphs Abs 04/04/2020 1,310  850 - 3,900 cells/uL Final   Absolute Monocytes 04/04/2020 343  200 - 950 cells/uL Final  Eosinophils Absolute 04/04/2020 82  15 - 500 cells/uL Final   Basophils Absolute 04/04/2020 31  0 - 200 cells/uL Final   Neutrophils Relative % 04/04/2020 54.7  % Final   Total Lymphocyte 04/04/2020 33.6  % Final   Monocytes Relative 04/04/2020 8.8  % Final   Eosinophils Relative 04/04/2020 2.1  % Final   Basophils Relative 04/04/2020 0.8  % Final   Glucose, Bld 04/04/2020 98  65 - 99 mg/dL Final   Comment: .            Fasting reference interval .    BUN 04/04/2020 20  7 - 25 mg/dL Final   Creat 04/04/2020 1.06* 0.50 - 0.99 mg/dL Final   Comment: For patients >49 years of  age, the reference limit for Creatinine is approximately 13% higher for people identified as African-American. .    GFR, Est Non African American 04/04/2020 55* > OR = 60 mL/min/1.79m2 Final   GFR, Est African American 04/04/2020 63  > OR = 60 mL/min/1.49m2 Final   BUN/Creatinine Ratio 04/04/2020 19  6 - 22 (calc) Final   Sodium 04/04/2020 138  135 - 146 mmol/L Final   Potassium 04/04/2020 3.9  3.5 - 5.3 mmol/L Final   Chloride 04/04/2020 101  98 - 110 mmol/L Final   CO2 04/04/2020 26  20 - 32 mmol/L Final   Calcium 04/04/2020 9.4  8.6 - 10.4 mg/dL Final   Total Protein 04/04/2020 7.0  6.1 - 8.1 g/dL Final   Albumin 04/04/2020 3.8  3.6 - 5.1 g/dL Final   Globulin 04/04/2020 3.2  1.9 - 3.7 g/dL (calc) Final   AG Ratio 04/04/2020 1.2  1.0 - 2.5 (calc) Final   Total Bilirubin 04/04/2020 0.4  0.2 - 1.2 mg/dL Final   Alkaline phosphatase (APISO) 04/04/2020 76  37 - 153 U/L Final   AST 04/04/2020 15  10 - 35 U/L Final   ALT 04/04/2020 10  6 - 29 U/L Final   Patient denies any falls, depression, or memory loss Immunization History  Administered Date(s) Administered   Fluad Quad(high Dose 65+) 05/20/2019   Influenza,inj,Quad PF,6+ Mos 06/08/2013, 05/26/2017, 05/28/2018   Influenza-Unspecified 04/20/2012, 05/05/2016, 05/26/2017   Pneumococcal Conjugate-13 03/08/2019   Zoster 08/05/2014    Past Medical History:  Diagnosis Date   Arthritis    Phreesia 04/05/2020   Atypical nevus 10/09/2006   slight-mderate-right upper thigh   Atypical nevus 01/24/2015   moderate-left back   Atypical nevus 02/22/2016   severe-right abdomen (WS)   Atypical nevus 02/22/2016   mild-right low abdomen (WS)   Cancer (HCC)    melanoma   Deep vein blood clot of right lower extremity (HCC)    leg   GERD (gastroesophageal reflux disease)    Heart murmur    History of migraine headaches    Hypertension    Left carotid artery stenosis    <20%   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 90 tablet 2   carvedilol (COREG) 6.25  MG tablet Take 1 tablet (6.25 mg total) by mouth 2 (two) times daily with a meal. 180 tablet 2   cholecalciferol (VITAMIN D) 1000 UNITS tablet Take 1,000 Units by mouth daily.     diclofenac (VOLTAREN) 75 MG EC tablet Take 75 mg by mouth 2 (two) times daily.     estradiol (ESTRACE) 2 MG tablet Take 2 mg by mouth daily.     fluticasone (FLONASE) 50 MCG/ACT nasal spray Place 2 sprays into both nostrils daily. 16 g 2   hydrochlorothiazide (MICROZIDE) 12.5 MG capsule TAKE 1 CAPSULE(12.5 MG) BY MOUTH DAILY 90 capsule 3   hydroxychloroquine (PLAQUENIL) 200 MG tablet Take 200 mg by mouth 2 (two) times daily.     levocetirizine (XYZAL) 5 MG tablet Take 1 tablet (5 mg total) by mouth every evening. 30 tablet 0   meclizine (ANTIVERT) 25 MG tablet Take 1 tablet (25 mg total) by mouth 3 (three) times daily as needed for dizziness. 30 tablet 0   omeprazole (PRILOSEC) 40 MG capsule Take 40 mg by mouth daily.   0   RESTASIS 0.05 % ophthalmic emulsion INSTILL 1 DROP INTO EACH EYE BID.     vitamin B-12 (CYANOCOBALAMIN) 100 MCG tablet Take 100 mcg by mouth daily.     YUVAFEM 10 MCG TABS vaginal tablet Place 10 mcg vaginally as needed.     metoprolol tartrate (LOPRESSOR) 50 MG tablet Take 1 tablet (50 mg total) by mouth once for 1 dose. 1 tablet 0   No current facility-administered medications on file prior to visit.   Allergies  Allergen Reactions   Losartan     Unclear if it was losartan or  verapamil that caused angioedema, both are stopped   Verapamil     Stopped by urgent care in 03/22/2016 for possible angioedema, losartan was stopped on 03/25/16 by cardiology as it is a more likely culprit   Codeine Nausea Only   Lisinopril Other (See Comments)    Sleepy and tired   Sulfa Antibiotics Nausea Only   Wellbutrin [Bupropion] Other (See Comments)    Reaction to losartan and verapamil   Social History   Socioeconomic History   Marital status: Married    Spouse name: Not on file   Number of children: Not on file   Years of education: Not on file   Highest education level: Not on file  Occupational History   Not on file  Tobacco Use   Smoking status: Never Smoker   Smokeless tobacco: Never Used  Vaping Use   Vaping Use: Never used  Substance and Sexual Activity   Alcohol use: No   Drug use: Never   Sexual activity: Not on file  Other Topics Concern   Not on file  Social History Narrative   Not on file   Social Determinants of Health   Financial Resource Strain:    Difficulty of Paying Living Expenses: Not on file  Food Insecurity:    Worried About Running Out of Food in the Last Year: Not on file   Ran Out of Food in the Last Year: Not on file  Transportation Needs:    Lack of Transportation (Medical): Not on file   Lack of Transportation (Non-Medical): Not on file  Physical Activity:    Days of Exercise per Week: Not on file   Minutes of Exercise per Session: Not on file  Stress:    Feeling of Stress : Not on file  Social Connections:    Frequency of Communication  with Friends and Family: Not on file   Frequency of Social Gatherings with Friends and Family: Not on file   Attends Religious Services: Not on file   Active Member of Clubs or Organizations: Not on file   Attends Archivist Meetings: Not on file   Marital Status: Not on file  Intimate Partner Violence:    Fear of Current or Ex-Partner: Not on file    Emotionally Abused: Not on file   Physically Abused: Not on file   Sexually Abused: Not on file   Family History  Problem Relation Age of Onset   Cancer Mother        breast, stomach   Diabetes Father    Heart disease Father    Cancer Sister    Heart disease Paternal Grandmother    Heart disease Paternal Grandfather       Review of Systems  All other systems reviewed and are negative.      Objective:   Physical Exam Vitals reviewed.  Constitutional:      General: She is not in acute distress.    Appearance: She is well-developed. She is not diaphoretic.  HENT:     Head: Normocephalic and atraumatic.     Right Ear: External ear normal.     Left Ear: External ear normal.     Nose: Nose normal.     Mouth/Throat:     Pharynx: No oropharyngeal exudate.  Eyes:     Conjunctiva/sclera: Conjunctivae normal.     Pupils: Pupils are equal, round, and reactive to light.  Neck:     Thyroid: No thyromegaly.     Vascular: No JVD.  Cardiovascular:     Rate and Rhythm: Normal rate and regular rhythm.     Heart sounds: Murmur heard.  No friction rub. No gallop.   Pulmonary:     Effort: Pulmonary effort is normal. No respiratory distress.     Breath sounds: Normal breath sounds. No wheezing or rales.  Chest:     Chest wall: No tenderness.  Abdominal:     General: Bowel sounds are normal. There is no distension.     Palpations: Abdomen is soft. There is no mass.     Tenderness: There is no abdominal tenderness. There is no guarding or rebound.  Musculoskeletal:        General: No tenderness or deformity. Normal range of motion.     Cervical back: Neck supple.  Lymphadenopathy:     Cervical: No cervical adenopathy.  Skin:    General: Skin is warm.     Coloration: Skin is not pale.     Findings: No erythema or rash.  Neurological:     Mental Status: She is alert and oriented to person, place, and time.     Cranial Nerves: No cranial nerve deficit.     Motor: No  abnormal muscle tone.     Coordination: Coordination normal.     Deep Tendon Reflexes: Reflexes are normal and symmetric.  Psychiatric:        Behavior: Behavior normal.        Thought Content: Thought content normal.        Judgment: Judgment normal.           Assessment & Plan:  General medical exam  Essential hypertension  Pure hypercholesterolemia  History of rheumatoid arthritis  Chronic fatigue - Plan: Vitamin B12, TSH  Physical exam today is significant only for elevated weight.  I believe most of her  issue is due to deconditioning due to the rheumatoid arthritis.  She has had to become very sedentary because of joint pain.  Therefore I recommended water aerobics or low impact exercises to improve cardiovascular conditioning and I believe that her dyspnea and fatigue will improve.  However I am suspicious that she may have an issue with her thyroid or vitamin B12 deficiency so we will check those as well.  Patient received her flu shot today.  She also received Pneumovax 23.  The remainder of her preventative care is up-to-date.  Her bone density was performed last year at her gynecologist and was reportedly better than 5 years ago.  Her mammogram and Pap smear were performed earlier this year.  She had her Covid vaccination in April.

## 2020-04-08 LAB — COMPLETE METABOLIC PANEL WITH GFR
AG Ratio: 1.2 (calc) (ref 1.0–2.5)
ALT: 10 U/L (ref 6–29)
AST: 15 U/L (ref 10–35)
Albumin: 3.8 g/dL (ref 3.6–5.1)
Alkaline phosphatase (APISO): 76 U/L (ref 37–153)
BUN/Creatinine Ratio: 19 (calc) (ref 6–22)
BUN: 20 mg/dL (ref 7–25)
CO2: 26 mmol/L (ref 20–32)
Calcium: 9.4 mg/dL (ref 8.6–10.4)
Chloride: 101 mmol/L (ref 98–110)
Creat: 1.06 mg/dL — ABNORMAL HIGH (ref 0.50–0.99)
GFR, Est African American: 63 mL/min/{1.73_m2} (ref >=60)
GFR, Est Non African American: 55 mL/min/{1.73_m2} — ABNORMAL LOW (ref >=60)
Globulin: 3.2 g/dL (calc) (ref 1.9–3.7)
Glucose, Bld: 98 mg/dL (ref 65–99)
Potassium: 3.9 mmol/L (ref 3.5–5.3)
Sodium: 138 mmol/L (ref 135–146)
Total Bilirubin: 0.4 mg/dL (ref 0.2–1.2)
Total Protein: 7 g/dL (ref 6.1–8.1)

## 2020-04-08 LAB — CBC WITH DIFFERENTIAL/PLATELET
Absolute Monocytes: 343 cells/uL (ref 200–950)
Basophils Absolute: 31 cells/uL (ref 0–200)
Basophils Relative: 0.8 %
Eosinophils Absolute: 82 cells/uL (ref 15–500)
Eosinophils Relative: 2.1 %
HCT: 35.1 % (ref 35.0–45.0)
Hemoglobin: 11.5 g/dL — ABNORMAL LOW (ref 11.7–15.5)
Lymphs Abs: 1310 cells/uL (ref 850–3900)
MCH: 30 pg (ref 27.0–33.0)
MCHC: 32.8 g/dL (ref 32.0–36.0)
MCV: 91.6 fL (ref 80.0–100.0)
MPV: 13.4 fL — ABNORMAL HIGH (ref 7.5–12.5)
Monocytes Relative: 8.8 %
Neutro Abs: 2133 cells/uL (ref 1500–7800)
Neutrophils Relative %: 54.7 %
Platelets: 195 10*3/uL (ref 140–400)
RBC: 3.83 10*6/uL (ref 3.80–5.10)
RDW: 13.9 % (ref 11.0–15.0)
Total Lymphocyte: 33.6 %
WBC: 3.9 10*3/uL (ref 3.8–10.8)

## 2020-04-08 LAB — VITAMIN B12: Vitamin B-12: 546 pg/mL (ref 200–1100)

## 2020-04-08 LAB — TEST AUTHORIZATION

## 2020-04-08 LAB — TSH: TSH: 3.18 mIU/L (ref 0.40–4.50)

## 2020-04-08 LAB — LIPID PANEL
Cholesterol: 160 mg/dL (ref <200)
HDL: 49 mg/dL — ABNORMAL LOW (ref >=50)
LDL Cholesterol (Calc): 79 mg/dL (calc)
Non-HDL Cholesterol (Calc): 111 mg/dL (calc) (ref <130)
Total CHOL/HDL Ratio: 3.3 (calc) (ref <5.0)
Triglycerides: 218 mg/dL — ABNORMAL HIGH (ref <150)

## 2020-08-15 ENCOUNTER — Other Ambulatory Visit: Payer: Self-pay | Admitting: Cardiology

## 2020-09-28 ENCOUNTER — Other Ambulatory Visit: Payer: Self-pay | Admitting: Family Medicine

## 2020-10-11 ENCOUNTER — Other Ambulatory Visit: Payer: Self-pay | Admitting: Cardiology

## 2020-11-02 ENCOUNTER — Telehealth: Payer: Self-pay | Admitting: Family Medicine

## 2020-11-02 NOTE — Telephone Encounter (Signed)
Patient called to request refill of antibiotic for sinus infection; two prescribed previous visit but the amoxicillin irritates her stomach.   Pharmacy:   [facility] [place], [place] - [address] AT [facility]  [address] [place] [address]  Phone:  [phone] Fax:  [phone]  DEA #:  [id number]  Please advise patient when Rx has been called in at [phone].

## 2020-11-02 NOTE — Telephone Encounter (Signed)
Patient has not been seen since 04/2020 and is requesting ABTx.   Requires OV prior to ABTx prescription.

## 2020-11-02 NOTE — Telephone Encounter (Signed)
Patient will call morning of 4/1 to schedule a same-day appt.

## 2020-11-03 ENCOUNTER — Encounter: Payer: Self-pay | Admitting: Family Medicine

## 2020-11-03 ENCOUNTER — Ambulatory Visit: Payer: 59 | Admitting: Family Medicine

## 2020-11-03 ENCOUNTER — Other Ambulatory Visit: Payer: Self-pay

## 2020-11-03 VITALS — BP 154/92 | HR 87 | Temp 97.3°F | Ht 64.0 in | Wt 207.2 lb

## 2020-11-03 DIAGNOSIS — J019 Acute sinusitis, unspecified: Secondary | ICD-10-CM | POA: Diagnosis not present

## 2020-11-03 DIAGNOSIS — M542 Cervicalgia: Secondary | ICD-10-CM | POA: Insufficient documentation

## 2020-11-03 DIAGNOSIS — I10 Essential (primary) hypertension: Secondary | ICD-10-CM | POA: Diagnosis not present

## 2020-11-03 DIAGNOSIS — S129XXS Fracture of neck, unspecified, sequela: Secondary | ICD-10-CM | POA: Insufficient documentation

## 2020-11-03 MED ORDER — AMOXICILLIN-POT CLAVULANATE 875-125 MG PO TABS
1.0000 | ORAL_TABLET | Freq: Two times a day (BID) | ORAL | 0 refills | Status: DC
Start: 2020-11-03 — End: 2021-05-04

## 2020-11-03 MED ORDER — FLUTICASONE PROPIONATE 50 MCG/ACT NA SUSP
2.0000 | Freq: Every day | NASAL | 6 refills | Status: DC
Start: 2020-11-03 — End: 2024-06-04

## 2020-11-03 MED ORDER — FLUCONAZOLE 150 MG PO TABS: 150.0000 mg | ORAL_TABLET | Freq: Once | ORAL | 0 refills | Status: AC

## 2020-11-03 NOTE — Progress Notes (Signed)
Subjective:    Patient ID: Connie Cisneros, female    DOB: [birth year], 70 y.o.   MRN: [id number]  HPI Patient presents today complaining of a sinus infection.  She states that her symptoms began about 2 or 3 months ago.  She would have rhinorrhea and congestion in her nose on a daily basis.  She was treating this with Benadryl and over-the-counter allergy medication.  However over the last 2 weeks, she has developed pain in her sinus area.  The pain is primarily over her nasal bridge and in her frontal sinuses bilaterally.  She also reports bloody and green mucus that she is able to blow out of her nose every day.  She does have a dull headache and persistent postnasal drip.  Her blood pressure is elevated.  She is taking carvedilol.  Her blood pressures consistently over 884 systolic. Past Medical History:  Diagnosis Date  . Arthritis    Phreesia 04/05/2020  . Atypical nevus 10/09/2006   slight-mderate-right upper thigh  . Atypical nevus 01/24/2015   moderate-left back  . Atypical nevus 02/22/2016   severe-right abdomen (WS)  . Atypical nevus 02/22/2016   mild-right low abdomen (WS)  . Cancer ([place])    melanoma  . 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 GERD (gastroesophageal reflux disease)   . Heart murmur   . History of migraine headaches   . Hypertension   . Left carotid artery stenosis    <20%  . Melanoma ([place]) 09/25/2005   lentigo maligna-Left shin( [facility] surgery center)  . Psoriatic arthritis ([place])   . Rheumatoid aortitis    Past Surgical History:  Procedure Laterality Date  . ABDOMINAL HYSTERECTOMY N/A    Phreesia 04/05/2020  . SPINE SURGERY N/A    Phreesia 04/05/2020  . TOTAL VAGINAL HYSTERECTOMY     Current Outpatient Medications on File Prior to Visit  Medication Sig Dispense Refill  . atorvastatin (LIPITOR) 40 MG tablet TAKE 1 TABLET(40 MG) BY MOUTH DAILY AT 6 PM 90 tablet 2  . carvedilol (COREG) 6.25 MG tablet TAKE 1 TABLET(6.25 MG)  BY MOUTH TWICE DAILY WITH A MEAL 180 tablet 3  . cholecalciferol (VITAMIN D) 1000 UNITS tablet Take 1,000 Units by mouth daily.    . diclofenac (VOLTAREN) 75 MG EC tablet Take 75 mg by mouth 2 (two) times daily.    Marland Kitchen estradiol (ESTRACE) 2 MG tablet Take 2 mg by mouth daily.    . fluticasone (FLONASE) 50 MCG/ACT nasal spray Place 2 sprays into both nostrils daily. 16 g 2  . hydrochlorothiazide (MICROZIDE) 12.5 MG capsule TAKE 1 CAPSULE(12.5 MG) BY MOUTH DAILY 90 capsule 3  . hydroxychloroquine (PLAQUENIL) 200 MG tablet Take 200 mg by mouth 2 (two) times daily.    Marland Kitchen levocetirizine (XYZAL) 5 MG tablet Take 1 tablet (5 mg total) by mouth every evening. 30 tablet 0  . meclizine (ANTIVERT) 25 MG tablet Take 1 tablet (25 mg total) by mouth 3 (three) times daily as needed for dizziness. 30 tablet 0  . omeprazole (PRILOSEC) 40 MG capsule Take 40 mg by mouth daily.   0  . RESTASIS 0.05 % ophthalmic emulsion INSTILL 1 DROP INTO EACH EYE BID.    Marland Kitchen vitamin B-12 (CYANOCOBALAMIN) 100 MCG tablet Take 100 mcg by mouth daily.    Merril Abbe 10 MCG TABS vaginal tablet Place 10 mcg vaginally as needed.     No current facility-administered medications on file prior to  visit.   Allergies  Allergen Reactions  . Losartan     Unclear if it was losartan or verapamil that caused angioedema, both are stopped  . Verapamil     Stopped by urgent care in 03/22/2016 for possible angioedema, losartan was stopped on 03/25/16 by cardiology as it is a more likely culprit  . Codeine Nausea Only  . Lisinopril Other (See Comments)    Sleepy and tired  . Sulfa Antibiotics Nausea Only  . Wellbutrin [Bupropion] Other (See Comments)    Reaction to losartan and verapamil   Social History   Socioeconomic History  . Marital status: Married    Spouse name: Not on file  . Number of children: Not on file  . Years of education: Not on file  . Highest education level: Not on file  Occupational History  . Not on file  Tobacco Use  .  Smoking status: Never Smoker  . Smokeless tobacco: Never Used  Vaping Use  . Vaping Use: Never used  Substance and Sexual Activity  . Alcohol use: No  . Drug use: Never  . Sexual activity: Not on file  Other Topics Concern  . Not on file  Social History Narrative  . Not on file   Social Determinants of Health   Financial Resource Strain: Not on file  Food Insecurity: Not on file  Transportation Needs: Not on file  Physical Activity: Not on file  Stress: Not on file  Social Connections: Not on file  Intimate Partner Violence: Not on file      Review of Systems  All other systems reviewed and are negative.      Objective:   Physical Exam Vitals reviewed.  Constitutional:      Appearance: Normal appearance. She is normal weight.  HENT:     Right Ear: Tympanic membrane and ear canal normal.     Left Ear: Tympanic membrane and ear canal normal.     Nose: Congestion and rhinorrhea present.     Right Sinus: Frontal sinus tenderness present.     Left Sinus: Frontal sinus tenderness present.  Cardiovascular:     Rate and Rhythm: Normal rate and regular rhythm.     Heart sounds: Normal heart sounds.  Pulmonary:     Effort: Pulmonary effort is normal.     Breath sounds: Normal breath sounds.  Neurological:     Mental Status: She is alert.        Assessment & Plan:  Essential hypertension  Acute rhinosinusitis  Treat her sinus infection with Augmentin 875 mg twice daily for 10 days coupled with Flonase 2 sprays each nostril daily.  Treat her blood pressure by increasing hydrochlorothiazide to 25 mg a day and rechecking blood pressure in 2 weeks

## 2020-11-10 ENCOUNTER — Encounter: Payer: Self-pay | Admitting: Cardiovascular Disease

## 2020-11-10 ENCOUNTER — Other Ambulatory Visit: Payer: Self-pay

## 2020-11-10 ENCOUNTER — Ambulatory Visit: Payer: 59 | Admitting: Cardiovascular Disease

## 2020-11-10 VITALS — BP 136/82 | HR 71 | Ht 64.0 in | Wt 205.0 lb

## 2020-11-10 DIAGNOSIS — I10 Essential (primary) hypertension: Secondary | ICD-10-CM | POA: Diagnosis not present

## 2020-11-10 DIAGNOSIS — R0600 Dyspnea, unspecified: Secondary | ICD-10-CM

## 2020-11-10 DIAGNOSIS — E782 Mixed hyperlipidemia: Secondary | ICD-10-CM | POA: Diagnosis not present

## 2020-11-10 DIAGNOSIS — Z87898 Personal history of other specified conditions: Secondary | ICD-10-CM

## 2020-11-10 DIAGNOSIS — E669 Obesity, unspecified: Secondary | ICD-10-CM

## 2020-11-10 DIAGNOSIS — R0609 Other forms of dyspnea: Secondary | ICD-10-CM

## 2020-11-10 DIAGNOSIS — Z8739 Personal history of other diseases of the musculoskeletal system and connective tissue: Secondary | ICD-10-CM

## 2020-11-10 MED ORDER — CARVEDILOL 6.25 MG PO TABS: ORAL_TABLET | ORAL | 3 refills | Status: DC

## 2020-11-10 NOTE — Progress Notes (Signed)
Patient ID: Connie Cisneros, female   DOB: 01/12/1951, 70 y.o.   MRN: [id number]      HPI:  [name] is a 70 year old female who I initially saw through the referral of Connie Cisneros.  I last saw her in August 2018. She presents for a follow-up cardiology evaluation.  [name]  has a history of hypertension, with mild labile blood pressure. She  has a history of migraine headaches. Remotely she was started on losartan at 100 mg as well as verapamil 120 mg. She previously had been on losartan HCTZ. She recently underwent evaluation of possible thyroid cyst which showed a normal thyroid size and scattered small colloid cysts and partially septated cysts. There was no evidence for any solid nodules. As part of that evaluation note is made of some mild plaque formation involving the bilateral carotid bulbs. A carotid duplex exam at [facility] showed less than 20% diameter reduction of the left proximal internal carotid artery not felt to be hemodynamically significant. Her right internal carotid was normal. She had normal antegrade vertebral flow.  [name] admits to a history of a heart murmur. She denies any episodes of chest pain. In 2011, she did undergo a nuclear perfusion study which revealed normal perfusion and function without scar or ischemia. An echo Doppler study done in 2011 showed normal systolic function with mild mitral regurgitation and mild tricuspid regurgitation. Presently, she denies any episodes of chest pain. She denies presyncope or syncope. Her blood pressure has been somewhat labile.   A 2-D echo Doppler study in 2014 demonstrated normal systolic function with an ejection fraction of 60-65%. Her mitral valve was mildly thickened without significant regurgitation. Atrial dimensions were normal. She had a normal aortic valve. There was trivial tricuspid regurgitation.  She has a history of hyperlipidemia, and a previous NMR profile demonstrated  LDL particle number of 1249 with a calculated LDL of 90, HDL 54 triglycerides 75 total cholesterol 159. Insulin resistance score was normal at 37.  I last saw her on March 13, 2015 at which time she continued to do well.  She was trying to exercise.    She has GERD for which he takes omeprazole.  She was taking Calan SR 240 mg at bedtime and losartan HCT 50/12.5 mg for hypertension.  She has tolerated atorvastatin 40 mg for hyperlipidemia.  She also takes meloxicam as needed for arthritic symptoms.  She did experience some vague stomach discomfort last week but this has resolved.  She denied chest pain, palpitations, PND or orthopnea.  She denied difficulty with sleep.    Since I last saw her, she had undergone C-spine surgery as an outpatient in September 2020.  She saw Connie Cisneros, [place] in follow-up in November 2020 and she had gained over 15 pounds over the prior year.  She was last evaluated by Connie Lofts, PA-C in June 2021 in a telemedicine evaluation.  At that time, there were some episodes of dyspnea on exertion and also some mild leg edema.  She underwent an echo Doppler study on February 04, 2020 which showed an EF of 60 to 65%, mild LVH, and mild aortic sclerosis.  She also was referred for coronary CTA which showed aortic atherosclerosis but coronary calcium score was 0 and there was no evidence for CAD.  Presently, she has had issues with migraine headaches for which she had taken verapamil but due to throat and lip swelling potential side effect this was discontinued.  She does have issues with arthritis.  Laboratory in August 2020: Showed triglyceride elevation at 218 with an LDL of 79 total cholesterol 160 with HDL 49.  Presently she is on carvedilol 6.25 mg twice a day and hydrochlorothiazide 25 mg daily.  She is on hydroxychloroquine for rheumatoid arthritis and psoriatic arthritis and she is followed by rheumatology.  She has GERD and is on omeprazole.  She presents for evaluation.  Past  Medical History:  Diagnosis Date  . Arthritis    Phreesia 04/05/2020  . Atypical nevus 10/09/2006   slight-mderate-right upper thigh  . Atypical nevus 01/24/2015   moderate-left back  . Atypical nevus 02/22/2016   severe-right abdomen (WS)  . Atypical nevus 02/22/2016   mild-right low abdomen (WS)  . Cancer ([place])    melanoma  . 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 GERD (gastroesophageal reflux disease)   . Heart murmur   . History of migraine headaches   . Hypertension   . Left carotid artery stenosis    <20%  . Melanoma ([place]) 09/25/2005   lentigo maligna-Left shin( [facility])  . Psoriatic arthritis ([place])   . Rheumatoid aortitis     Past Surgical History:  Procedure Laterality Date  . ABDOMINAL HYSTERECTOMY N/A    Phreesia 04/05/2020  . SPINE SURGERY N/A    Phreesia 04/05/2020  . TOTAL VAGINAL HYSTERECTOMY      Allergies  Allergen Reactions  . Losartan     Unclear if it was losartan or verapamil that caused angioedema, both are stopped  . Verapamil     Stopped by urgent care in 03/22/2016 for possible angioedema, losartan was stopped on 03/25/16 by cardiology as it is a more likely culprit  . Codeine Nausea Only  . Lisinopril Other (See Comments)    Sleepy and tired  . Sulfa Antibiotics Nausea Only  . Wellbutrin [Bupropion] Other (See Comments)    Reaction to losartan and verapamil    Current Outpatient Medications  Medication Sig Dispense Refill  . amoxicillin-clavulanate (AUGMENTIN) 875-125 MG tablet Take 1 tablet by mouth 2 (two) times daily. 20 tablet 0  . atorvastatin (LIPITOR) 40 MG tablet TAKE 1 TABLET(40 MG) BY MOUTH DAILY AT 6 PM 90 tablet 2  . cholecalciferol (VITAMIN D) 1000 UNITS tablet Take 1,000 Units by mouth daily.    . diclofenac (VOLTAREN) 75 MG EC tablet Take 75 mg by mouth 2 (two) times daily.    Marland Kitchen estradiol (ESTRACE) 2 MG tablet Take 2 mg by mouth daily.    . fluticasone (FLONASE) 50 MCG/ACT nasal spray  Place 2 sprays into both nostrils daily. 16 g 2  . fluticasone (FLONASE) 50 MCG/ACT nasal spray Place 2 sprays into both nostrils daily. 16 g 6  . hydrochlorothiazide (HYDRODIURIL) 25 MG tablet Take 25 mg by mouth daily.    . hydroxychloroquine (PLAQUENIL) 200 MG tablet Take 200 mg by mouth 2 (two) times daily.    Marland Kitchen levocetirizine (XYZAL) 5 MG tablet Take 1 tablet (5 mg total) by mouth every evening. 30 tablet 0  . meclizine (ANTIVERT) 25 MG tablet Take 1 tablet (25 mg total) by mouth 3 (three) times daily as needed for dizziness. 30 tablet 0  . omeprazole (PRILOSEC) 40 MG capsule Take 40 mg by mouth daily.   0  . RESTASIS 0.05 % ophthalmic emulsion INSTILL 1 DROP INTO EACH EYE BID.    Marland Kitchen vitamin B-12 (CYANOCOBALAMIN) 100 MCG tablet Take 100 mcg  by mouth daily.    Merril Abbe 10 MCG TABS vaginal tablet Place 10 mcg vaginally as needed.    . carvedilol (COREG) 6.25 MG tablet TAKE 2 tablets (12.5 mg) BY MOUTH in the AM, and 1 tablet (6.25 mg) in the PM. 180 tablet 3   No current facility-administered medications for this visit.    Social history is notable in that she is married for over 40 years. She has 3 children. She works for the [employer]. She completed 3 years of college. There is no tobacco or alcohol use. She does walk approximately 3-4 days per week.  Family History  Problem Relation Age of Onset  . Cancer Mother        breast, stomach  . Diabetes Father   . Heart disease Father   . Cancer Sister   . Heart disease Paternal Grandmother   . Heart disease Paternal Grandfather    ROS General: Negative; No fevers, chills, or night sweats;  HEENT: Negative; No changes in vision or hearing, sinus congestion, difficulty swallowing Pulmonary: Negative; No cough, wheezing, shortness of breath, hemoptysis Cardiovascular: Negative; No chest pain, presyncope, syncope, palpitations GI: Negative; No nausea, vomiting, diarrhea, or abdominal pain GU: Negative; No dysuria, hematuria, or  difficulty voiding Musculoskeletal: Rheumatoid and psoriatic arthritis Hematologic/Oncology: Negative; no easy bruising, bleeding Endocrine: Negative; no heat/cold intolerance; no diabetes Neuro: Positive for history of migraine headaches; no changes in balance,  Skin: Negative; No rashes or skin lesions Psychiatric: Negative; No behavioral problems, depression Sleep: Negative; No snoring, daytime sleepiness, hypersomnolence, bruxism, restless legs, hypnogognic hallucinations, no cataplexy Other comprehensive 14 point system review is negative.   PE BP 136/82 (BP Location: Left Arm, Patient Position: Sitting, Cuff Size: Large)   Pulse 71   Ht 5' 4"  (1.626 m)   Wt 205 lb (93 kg)   BMI 35.19 kg/m    Repeat blood pressure by me 134/78.  Wt Readings from Last 3 Encounters:  11/10/20 205 lb (93 kg)  11/03/20 207 lb 3.2 oz (94 kg)  01/11/20 203 lb (92.1 kg)   Since her prior evaluation with me in 2016 her weight has increased of 168 up to 205 pounds.  General: Alert, oriented, no distress.  Skin: normal turgor, no rashes, warm and dry HEENT: Normocephalic, atraumatic. Pupils equal round and reactive to light; sclera anicteric; extraocular muscles intact;  Nose without nasal septal hypertrophy Mouth/Parynx benign; Mallinpatti scale 3 Neck: No JVD, no carotid bruits; normal carotid upstroke Lungs: clear to ausculatation and percussion; no wheezing or rales Chest wall: without tenderness to palpitation Heart: PMI not displaced, RRR, s1 s2 normal, 1/6 systolic murmur, no diastolic murmur, no rubs, gallops, thrills, or heaves Abdomen: Mild central adiposity;  soft, nontender; no hepatosplenomehaly, BS+; abdominal aorta nontender and not dilated by palpation. Back: no CVA tenderness Pulses 2+ Musculoskeletal: full range of motion, normal strength, no joint deformities Extremities: no clubbing cyanosis or edema, Homan's sign negative  Neurologic: grossly nonfocal; Cranial nerves grossly  wnl Psychologic: Normal mood and affect   ECG (independently read by me):NSR at 71; no ectopy, QTc 460 msec  August 2016 ECG (independently read by me): Normal sinus rhythm at 73 bpm.  No ectopy.  Normal intervals.  January 2015 ECG (independently read by me): Normal sinus rhythm at 69 beats per minute. No significant ST changes. Normal intervals.  Prior ECG from 07/21/2013: Normal sinus rhythm at 70 beats per minute. Normal intervals. Non specific ST changes  LABS:  BMP Latest Ref  Rng & Units 04/04/2020 01/31/2020 02/26/2019  Glucose 65 - 99 mg/dL 98 97 103(H)  BUN 7 - 25 mg/dL 20 10 15   Creatinine 0.50 - 0.99 mg/dL 1.06(H) 0.91 0.93  BUN/Creat Ratio 6 - 22 (calc) 19 02(I) NOT APPLICABLE  Sodium 097 - 146 mmol/L 138 137 137  Potassium 3.5 - 5.3 mmol/L 3.9 4.2 3.9  Chloride 98 - 110 mmol/L 101 106 100  CO2 20 - 32 mmol/L 26 23 27   Calcium 8.6 - 10.4 mg/dL 9.4 9.0 9.7   Hepatic Function Latest Ref Rng & Units 04/04/2020 02/26/2019 12/23/2017  Total Protein 6.1 - 8.1 g/dL 7.0 7.4 7.3  Albumin 3.6 - 5.1 g/dL - - -  AST 10 - 35 U/L 15 20 17   ALT 6 - 29 U/L 10 16 14   Alk Phosphatase 33 - 130 U/L - - -  Total Bilirubin 0.2 - 1.2 mg/dL 0.4 0.5 0.3  Bilirubin, Direct <=0.2 mg/dL - - -   CBC Latest Ref Rng & Units 04/04/2020 02/26/2019 12/23/2017  WBC 3.8 - 10.8 Thousand/uL 3.9 4.9 4.6  Hemoglobin 11.7 - 15.5 g/dL 11.5(L) 12.6 11.8  Hematocrit 35.0 - 45.0 % 35.1 37.1 35.2  Platelets 140 - 400 Thousand/uL 195 256 247   Lab Results  Component Value Date   MCV 91.6 04/04/2020   MCV 88.5 02/26/2019   MCV 85.6 12/23/2017   Lab Results  Component Value Date   TSH 3.18 04/04/2020   No results found for: HGBA1C   Lipid Panel     Component Value Date/Time   CHOL 160 04/04/2020 0813   CHOL 162 01/31/2020 0940   CHOL 159 07/21/2013 1336   TRIG 218 (H) 04/04/2020 0813   TRIG 75 07/21/2013 1336   HDL 49 (L) 04/04/2020 0813   HDL 47 01/31/2020 0940   HDL 54 07/21/2013 1336   CHOLHDL 3.3  04/04/2020 0813   VLDL 18 12/03/2016 0812   LDLCALC 79 04/04/2020 0813   LDLCALC 90 07/21/2013 1336    RADIOLOGY: No results found.  IMPRESSION: 1. Essential hypertension   2. Mixed hyperlipidemia   3. Dyspnea on exertion   4. Obesity, Class II, BMI 35-39.9   5. History of rheumatoid arthritis   6. H/O angioedema     ASSESSMENT AND PLAN:  [name] is a 70year-old female who has a history of hypertension,hyperlipidemia and family history for coronary disease.  In the past, she has had some blood pressure lability.  She had previously been on verapamil which was helpful both for blood pressure as well as migraine headaches.  Apparently this was ultimately discontinued due to development of possible throat and lip swelling worrisome for angioedema.  In the past, she had experienced exertional dyspnea which ultimately led to repeat echocardiography in July 2021 which showed an EF of 60 to 65% with mild LVH and evidence for aortic sclerosis without stenosis.  Coronary CTA revealed a calcium score of 0 and normal coronary arteries.  She was noted to have aortic atherosclerosis.  Her blood pressure today was initially 136/82.  I discussed with her most recent hypertensive guidelines.  She has been taking carvedilol 6.25 mg twice a day in addition to hydrochlorothiazide which was recently increased last week by her primary physician up to 25 mg.  Her resting pulse is in the 70s.  I have suggested that she increase her morning dose of carvedilol to 12.5 mg but she will continue with the 6.25 mg evening dose.  She is on  hydroxychloroquine for rheumatologic issues with psoriatic dryness and rheumatoid arthritis.  QTc interval is 460.  Her GERD is controlled with omeprazole.  She continues to be on atorvastatin 40 mg for hyperlipidemia.  LDL cholesterol in August 2021 was 79 but her triglycerides were increased to 18.  She has not had recent laboratory regarding lipids and I have suggested a  follow-up chemistry profile as well as fasting lipid studies.  I will contact her regarding her laboratory.  I will see her in 1 year for reevaluation   Troy Sine, MD, [facility] 11/12/2020 11:33 AM

## 2020-11-10 NOTE — Patient Instructions (Signed)
Medication Instructions:  Increase Carvedilol to 12.5 mg in the morning, and 6.25 in the evening.  *If you need a refill on your cardiac medications before your next appointment, please call your pharmacy*   Lab Work: CMET, LIPID   If you have labs (blood work) drawn today and your tests are completely normal, you will receive your results only by: Marland Kitchen MyChart Message (if you have MyChart) OR . A paper copy in the mail If you have any lab test that is abnormal or we need to change your treatment, we will call you to review the results.    Follow-Up: At [facility], you and your health needs are our priority.  As part of our continuing mission to provide you with exceptional heart care, we have created designated Provider Care Teams.  These Care Teams include your primary Cardiologist (physician) and Advanced Practice Providers (APPs -  Physician Assistants and Nurse Practitioners) who all work together to provide you with the care you need, when you need it.  We recommend signing up for the patient portal called "MyChart".  Sign up information is provided on this After Visit Summary.  MyChart is used to connect with patients for Virtual Visits (Telemedicine).  Patients are able to view lab/test results, encounter notes, upcoming appointments, etc.  Non-urgent messages can be sent to your provider as well.   To learn more about what you can do with MyChart, go to [email or web address].    Your next appointment:   12 month(s)  The format for your next appointment:   In Person  Provider:   Shelva Majestic, MD

## 2020-11-12 ENCOUNTER — Encounter: Payer: Self-pay | Admitting: Cardiovascular Disease

## 2020-11-24 LAB — COMPREHENSIVE METABOLIC PANEL
ALT: 13 IU/L (ref 0–32)
AST: 16 IU/L (ref 0–40)
Albumin/Globulin Ratio: 1.2 (ref 1.2–2.2)
Albumin: 3.9 g/dL (ref 3.8–4.8)
Alkaline Phosphatase: 77 IU/L (ref 44–121)
BUN/Creatinine Ratio: 13 (ref 12–28)
BUN: 15 mg/dL (ref 8–27)
Bilirubin Total: 0.3 mg/dL (ref 0.0–1.2)
CO2: 26 mmol/L (ref 20–29)
Calcium: 9.2 mg/dL (ref 8.7–10.3)
Chloride: 99 mmol/L (ref 96–106)
Creatinine, Ser: 1.18 mg/dL — ABNORMAL HIGH (ref 0.57–1.00)
Globulin, Total: 3.3 g/dL (ref 1.5–4.5)
Glucose: 91 mg/dL (ref 65–99)
Potassium: 3.9 mmol/L (ref 3.5–5.2)
Sodium: 141 mmol/L (ref 134–144)
Total Protein: 7.2 g/dL (ref 6.0–8.5)
eGFR: 51 mL/min/{1.73_m2} — ABNORMAL LOW (ref >59)

## 2020-11-24 LAB — LIPID PANEL
Chol/HDL Ratio: 3 ratio (ref 0.0–4.4)
Cholesterol, Total: 158 mg/dL (ref 100–199)
HDL: 52 mg/dL (ref >39)
LDL Chol Calc (NIH): 80 mg/dL (ref 0–99)
Triglycerides: 149 mg/dL (ref 0–149)
VLDL Cholesterol Cal: 26 mg/dL (ref 5–40)

## 2020-12-05 ENCOUNTER — Telehealth: Payer: Self-pay | Admitting: Cardiovascular Disease

## 2020-12-05 NOTE — Telephone Encounter (Signed)
Pt is returning call from 12/04/20 regarding her lab results. Please advise

## 2020-12-05 NOTE — Telephone Encounter (Signed)
Pt updated with lab results and verbalized understanding.  

## 2021-01-29 ENCOUNTER — Other Ambulatory Visit: Payer: Self-pay

## 2021-01-29 ENCOUNTER — Ambulatory Visit: Payer: 59 | Admitting: Physician Assistant

## 2021-01-29 ENCOUNTER — Encounter: Payer: Self-pay | Admitting: Physician Assistant

## 2021-01-29 DIAGNOSIS — R202 Paresthesia of skin: Secondary | ICD-10-CM | POA: Diagnosis not present

## 2021-01-29 DIAGNOSIS — Z85820 Personal history of malignant melanoma of skin: Secondary | ICD-10-CM | POA: Diagnosis not present

## 2021-01-29 DIAGNOSIS — Z86018 Personal history of other benign neoplasm: Secondary | ICD-10-CM

## 2021-01-29 DIAGNOSIS — Z1283 Encounter for screening for malignant neoplasm of skin: Secondary | ICD-10-CM

## 2021-01-29 NOTE — Progress Notes (Signed)
   Follow-Up Visit   Subjective  Connie Cisneros is a 70 y.o. female who presents for the following: Annual Exam (Patient here today for yearly skin check, per patient her left thigh has been itching x 1 year. Per patient no lesion or anything on the thigh it just itches.  Personal history of atypical moles and melanoma. No personal history of non mole skin cancer. No family history of atypical moles, melanoma or non mole skin cancer. ).   The following portions of the chart were reviewed this encounter and updated as appropriate:  Tobacco  Allergies  Meds  Problems  Med Hx  Surg Hx  Fam Hx       Objective  Well appearing patient in no apparent distress; mood and affect are within normal limits.  A full examination was performed including scalp, head, eyes, ears, nose, lips, neck, chest, axillae, abdomen, back, buttocks, bilateral upper extremities, bilateral lower extremities, hands, feet, fingers, toes, fingernails, and toenails. All findings within normal limits unless otherwise noted below.  Left Thigh - Anterior No skin presentation  Head to Toe No atypical nevi or signs of NMSC noted at the time of the visit. MM scar was clear.  Assessment & Plan  Notalgia paresthetica Left Thigh - Anterior  No topical treatment explained to patient.  Skin exam for malignant neoplasm Head to Toe  Yearly skin check    I, Tammey Deeg, PA-C, have reviewed all documentation's for this visit.  The documentation on 01/29/21 for the exam, diagnosis, procedures and orders are all accurate and complete.

## 2021-02-01 ENCOUNTER — Ambulatory Visit: Payer: 59 | Admitting: Physician Assistant

## 2021-05-04 ENCOUNTER — Other Ambulatory Visit: Payer: Self-pay

## 2021-05-04 ENCOUNTER — Encounter: Payer: Self-pay | Admitting: Family Medicine

## 2021-05-04 ENCOUNTER — Ambulatory Visit: Payer: 59 | Admitting: Family Medicine

## 2021-05-04 VITALS — BP 160/96 | HR 76 | Temp 97.2°F | Ht 64.0 in | Wt 205.0 lb

## 2021-05-04 DIAGNOSIS — I10 Essential (primary) hypertension: Secondary | ICD-10-CM | POA: Diagnosis not present

## 2021-05-04 DIAGNOSIS — J019 Acute sinusitis, unspecified: Secondary | ICD-10-CM | POA: Diagnosis not present

## 2021-05-04 DIAGNOSIS — E7800 Pure hypercholesterolemia, unspecified: Secondary | ICD-10-CM | POA: Diagnosis not present

## 2021-05-04 MED ORDER — FLUCONAZOLE 150 MG PO TABS: 150.0000 mg | ORAL_TABLET | Freq: Once | ORAL | 0 refills | Status: AC

## 2021-05-04 MED ORDER — AMOXICILLIN-POT CLAVULANATE 875-125 MG PO TABS: 1.0000 | ORAL_TABLET | Freq: Two times a day (BID) | ORAL | 0 refills | Status: DC

## 2021-05-04 NOTE — Progress Notes (Signed)
Subjective:    Patient ID: Connie Cisneros, female    DOB: Mar 02, 1951, 70 y.o.   MRN: [id number]  Sinus Problem  Patient presents today complaining of a sinus infection.  Patient has had her symptoms now for more than a month.  She reports head pressure, sinus pain on a daily basis, she reports postnasal drip.  She reports sneezing.  She reports rhinorrhea.  She reports postnasal drip causing cough.  She denies any fevers or chills or chest pain.  She is tender to percussion in both frontal and maxillary sinuses today.  She denies any sore throat.  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 180 tablet 3   cholecalciferol (VITAMIN D) 1000 UNITS tablet Take 1,000 Units by mouth daily.     diclofenac (VOLTAREN) 75 MG EC tablet Take 75 mg by mouth 2 (two) times daily.      estradiol (ESTRACE) 2 MG tablet Take 2 mg by mouth daily.     fluticasone (FLONASE) 50 MCG/ACT nasal spray Place 2 sprays into both nostrils daily. 16 g 6   hydrochlorothiazide (MICROZIDE) 12.5 MG capsule Take 12.5 mg by mouth daily.     hydroxychloroquine (PLAQUENIL) 200 MG tablet Take 200 mg by mouth 2 (two) times daily.     levocetirizine (XYZAL) 5 MG tablet Take 1 tablet (5 mg total) by mouth every evening. 30 tablet 0   omeprazole (PRILOSEC) 40 MG capsule Take 40 mg by mouth daily.   0   RESTASIS 0.05 % ophthalmic emulsion INSTILL 1 DROP INTO EACH EYE BID.     vitamin B-12 (CYANOCOBALAMIN) 100 MCG tablet Take 100 mcg by mouth daily.     meclizine (ANTIVERT) 25 MG tablet Take 1 tablet (25 mg total) by mouth 3 (three) times daily as needed for dizziness. (Patient not taking: Reported on 05/04/2021) 30 tablet 0   YUVAFEM 10 MCG TABS vaginal tablet Place 10 mcg vaginally as needed. (Patient not taking: Reported on 05/04/2021)     No current facility-administered medications on file prior to visit.   Allergies  Allergen Reactions   Losartan     Unclear if it was losartan or verapamil that  caused angioedema, both are stopped   Verapamil     Stopped by urgent care in 03/22/2016 for possible angioedema, losartan was stopped on 03/25/16 by cardiology as it is a more likely culprit   Codeine Nausea Only   Lisinopril Other (See Comments)    Sleepy and tired   Sulfa Antibiotics Nausea Only   Wellbutrin [Bupropion] Other (See Comments)    Reaction to losartan and verapamil   Social History   Socioeconomic History   Marital status: Married    Spouse name: Not on file   Number of children: Not on file   Years of education: Not on file   Highest education level: Not on file  Occupational History   Not on file  Tobacco Use   Smoking status: Never   Smokeless tobacco: Never  Vaping Use   Vaping Use: Never used  Substance and Sexual Activity   Alcohol use: No   Drug use: Never   Sexual  activity: Not on file  Other Topics Concern   Not on file  Social History Narrative   Not on file   Social Determinants of Health   Financial Resource Strain: Not on file  Food Insecurity: Not on file  Transportation Needs: Not on file  Physical Activity: Not on file  Stress: Not on file  Social Connections: Not on file  Intimate Partner Violence: Not on file      Review of Systems  All other systems reviewed and are negative.     Objective:   Physical Exam Vitals reviewed.  Constitutional:      Appearance: Normal appearance. She is normal weight.  HENT:     Right Ear: Tympanic membrane and ear canal normal.     Left Ear: Tympanic membrane and ear canal normal.     Nose: Congestion and rhinorrhea present.     Right Sinus: Frontal sinus tenderness present.     Left Sinus: Frontal sinus tenderness present.  Cardiovascular:     Rate and Rhythm: Normal rate and regular rhythm.     Heart sounds: Normal heart sounds.  Pulmonary:     Effort: Pulmonary effort is normal.     Breath sounds: Normal breath sounds.  Neurological:     Mental Status: She is alert.       Assessment & Plan:  Essential hypertension - Plan: CBC with Differential/Platelet, COMPLETE METABOLIC PANEL WITH GFR, Lipid panel  Pure hypercholesterolemia - Plan: CBC with Differential/Platelet, COMPLETE METABOLIC PANEL WITH GFR, Lipid panel  Acute rhinosinusitis I will treat her sinus infection with Augmentin 875 mg p.o. twice daily for 10 days.  I recommended she use Allegra and Flonase in the spring and in the fall to help prevent these because these do seem to be allergy driven.  Her blood pressure today is extremely high.  I recommended she check this twice a day every day for a week and report the values to me as we may need to drastically uptitrate her medications to achieve blood pressure less than 140/90

## 2021-05-05 ENCOUNTER — Other Ambulatory Visit: Payer: Self-pay | Admitting: Cardiology

## 2021-05-11 ENCOUNTER — Other Ambulatory Visit: Payer: 59

## 2021-05-11 ENCOUNTER — Other Ambulatory Visit: Payer: Self-pay

## 2021-05-11 LAB — CBC WITH DIFFERENTIAL/PLATELET
Absolute Monocytes: 394 cells/uL (ref 200–950)
Basophils Absolute: 49 cells/uL (ref 0–200)
Basophils Relative: 0.9 %
Eosinophils Absolute: 140 cells/uL (ref 15–500)
Eosinophils Relative: 2.6 %
HCT: 37.9 % (ref 35.0–45.0)
Hemoglobin: 12.5 g/dL (ref 11.7–15.5)
Lymphs Abs: 1642 cells/uL (ref 850–3900)
MCH: 30.4 pg (ref 27.0–33.0)
MCHC: 33 g/dL (ref 32.0–36.0)
MCV: 92.2 fL (ref 80.0–100.0)
MPV: 12.9 fL — ABNORMAL HIGH (ref 7.5–12.5)
Monocytes Relative: 7.3 %
Neutro Abs: 3175 cells/uL (ref 1500–7800)
Neutrophils Relative %: 58.8 %
Platelets: 226 10*3/uL (ref 140–400)
RBC: 4.11 10*6/uL (ref 3.80–5.10)
RDW: 13.8 % (ref 11.0–15.0)
Total Lymphocyte: 30.4 %
WBC: 5.4 10*3/uL (ref 3.8–10.8)

## 2021-05-11 LAB — COMPLETE METABOLIC PANEL WITH GFR
AG Ratio: 1.3 (calc) (ref 1.0–2.5)
ALT: 12 U/L (ref 6–29)
AST: 15 U/L (ref 10–35)
Albumin: 3.9 g/dL (ref 3.6–5.1)
Alkaline phosphatase (APISO): 69 U/L (ref 37–153)
BUN: 13 mg/dL (ref 7–25)
CO2: 28 mmol/L (ref 20–32)
Calcium: 9.5 mg/dL (ref 8.6–10.4)
Chloride: 100 mmol/L (ref 98–110)
Creat: 0.97 mg/dL (ref 0.50–1.05)
Globulin: 3.1 g/dL (calc) (ref 1.9–3.7)
Glucose, Bld: 89 mg/dL (ref 65–99)
Potassium: 3.4 mmol/L — ABNORMAL LOW (ref 3.5–5.3)
Sodium: 138 mmol/L (ref 135–146)
Total Bilirubin: 0.5 mg/dL (ref 0.2–1.2)
Total Protein: 7 g/dL (ref 6.1–8.1)
eGFR: 64 mL/min/{1.73_m2} (ref >=60)

## 2021-05-11 LAB — LIPID PANEL
Cholesterol: 157 mg/dL (ref <200)
HDL: 51 mg/dL (ref >=50)
LDL Cholesterol (Calc): 79 mg/dL (calc)
Non-HDL Cholesterol (Calc): 106 mg/dL (calc) (ref <130)
Total CHOL/HDL Ratio: 3.1 (calc) (ref <5.0)
Triglycerides: 174 mg/dL — ABNORMAL HIGH (ref <150)

## 2021-05-18 ENCOUNTER — Encounter: Payer: 59 | Admitting: Family Medicine

## 2021-05-28 ENCOUNTER — Other Ambulatory Visit: Payer: Self-pay

## 2021-05-28 ENCOUNTER — Other Ambulatory Visit: Payer: 59

## 2021-05-31 ENCOUNTER — Ambulatory Visit: Payer: 59 | Admitting: Podiatry

## 2021-05-31 ENCOUNTER — Other Ambulatory Visit: Payer: Self-pay

## 2021-05-31 ENCOUNTER — Encounter: Payer: Self-pay | Admitting: Podiatry

## 2021-05-31 DIAGNOSIS — R010 Benign and innocent cardiac murmurs: Secondary | ICD-10-CM | POA: Insufficient documentation

## 2021-05-31 DIAGNOSIS — L603 Nail dystrophy: Secondary | ICD-10-CM | POA: Diagnosis not present

## 2021-05-31 DIAGNOSIS — E559 Vitamin D deficiency, unspecified: Secondary | ICD-10-CM | POA: Insufficient documentation

## 2021-05-31 NOTE — Progress Notes (Signed)
Subjective:  Patient ID: Connie Cisneros, female    DOB: 08/21/1950,  MRN: [id number] HPI Chief Complaint  Patient presents with   Nail Problem    Hallux bilateral - toenails damaged in sneakers [place] a [place] the [place] last [place], Jan 2022 toenails fell off, now keep splitting and breaking off, left hallux has a wound at the nailbed, she has psoriatic arthritis   New Patient (Initial Visit)    70 y.o. female presents with the above complaint.   ROS: Denies fever chills nausea run muscle aches pains calf pain back pain chest pain shortness of breath.  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/2005   lentigo maligna-Left shin( [facility] surgery center)   Psoriatic arthritis ([place])    Rheumatoid aortitis    Past Surgical History:  Procedure Laterality Date   ABDOMINAL HYSTERECTOMY N/A    Phreesia 04/05/2020   SPINE SURGERY N/A    Phreesia 04/05/2020   TOTAL VAGINAL HYSTERECTOMY      Current Outpatient Medications:    amoxicillin-clavulanate (AUGMENTIN) 875-125 MG tablet, Take 1 tablet by mouth 2 (two) times daily., Disp: 20 tablet, Rfl: 0   atorvastatin (LIPITOR) 40 MG tablet, TAKE 1 TABLET(40 MG) BY MOUTH DAILY AT 6 PM, Disp: 90 tablet, Rfl: 3   carvedilol (COREG) 6.25 MG tablet, TAKE 2 tablets (12.5 mg) BY MOUTH in the AM, and 1 tablet (6.25 mg) in the PM., Disp: 180 tablet, Rfl: 3   cholecalciferol (VITAMIN D) 1000 UNITS tablet, Take 1,000 Units by  mouth daily., Disp: , Rfl:    diclofenac (VOLTAREN) 75 MG EC tablet, Take 75 mg by mouth 2 (two) times daily., Disp: , Rfl:    estradiol (ESTRACE) 2 MG tablet, Take 2 mg by mouth daily., Disp: , Rfl:    fluticasone (FLONASE) 50 MCG/ACT nasal spray, Place 2 sprays into both nostrils daily., Disp: 16 g, Rfl: 6   hydrochlorothiazide (MICROZIDE) 12.5 MG capsule, Take 12.5 mg by mouth daily., Disp: , Rfl:    hydroxychloroquine (PLAQUENIL) 200 MG tablet, Take 200 mg by mouth 2 (two) times daily., Disp: , Rfl:    levocetirizine (XYZAL) 5 MG tablet, Take 1 tablet (5 mg total) by mouth every evening., Disp: 30 tablet, Rfl: 0   meclizine (ANTIVERT) 25 MG tablet, Take 1 tablet (25 mg total) by mouth 3 (three) times daily as needed for dizziness. (Patient not taking: Reported on 05/04/2021), Disp: 30 tablet, Rfl: 0   omeprazole (PRILOSEC) 40 MG capsule, Take 40 mg by mouth daily. , Disp: , Rfl: 0   RESTASIS 0.05 % ophthalmic emulsion, INSTILL 1 DROP INTO EACH EYE BID., Disp: , Rfl:    vitamin B-12 (CYANOCOBALAMIN) 100 MCG tablet, Take 100 mcg by mouth daily., Disp: , Rfl:    YUVAFEM 10 MCG TABS vaginal tablet, Place 10 mcg vaginally as needed. (Patient not taking: Reported  on 05/04/2021), Disp: , Rfl:   Allergies  Allergen Reactions   Losartan     Unclear if it was losartan or verapamil that caused angioedema, both are stopped   Verapamil     Stopped by urgent care in 03/22/2016 for possible angioedema, losartan was stopped on 03/25/16 by cardiology as it is a more likely culprit   Codeine Nausea Only   Lisinopril Other (See Comments)    Sleepy and tired   Sulfa Antibiotics Nausea Only   Wellbutrin [Bupropion] Other (See Comments)    Reaction to losartan and verapamil   Review of Systems Objective:  There were no vitals filed for this visit.  General: Well developed, nourished, in no acute distress, alert and oriented x3   Dermatological: Skin is warm, dry and supple bilateral. Nails x 10 are well  maintained; remaining integument appears unremarkable at this time. There are no open sores, no preulcerative lesions, no rash or signs of infection present.  Nail dystrophy hallux bilateral no lesions wounds rashes.  Vascular: Dorsalis Pedis artery and Posterior Tibial artery pedal pulses are 2/4 bilateral with immedate capillary fill time. Pedal hair growth present. No varicosities and no lower extremity edema present bilateral.   Neruologic: Grossly intact via light touch bilateral. Vibratory intact via tuning fork bilateral. Protective threshold with Semmes Wienstein monofilament intact to all pedal sites bilateral. Patellar and Achilles deep tendon reflexes 2+ bilateral. No Babinski or clonus noted bilateral.   Musculoskeletal: No gross boney pedal deformities bilateral. No pain, crepitus, or limitation noted with foot and ankle range of motion bilateral. Muscular strength 5/5 in all groups tested bilateral.  Gait: Unassisted, Nonantalgic.    Radiographs:  None taken  Assessment & Plan:   Assessment: Nail dystrophy hallux bilateral  Plan: Follow-up as needed continue to keep the nailbed moisturized as the new nail grows back     Hashem Goynes T. [place], [place]

## 2021-06-08 ENCOUNTER — Encounter: Payer: Self-pay | Admitting: Family Medicine

## 2021-06-08 ENCOUNTER — Ambulatory Visit (INDEPENDENT_AMBULATORY_CARE_PROVIDER_SITE_OTHER): Payer: 59 | Admitting: Family Medicine

## 2021-06-08 ENCOUNTER — Other Ambulatory Visit: Payer: Self-pay

## 2021-06-08 VITALS — BP 142/84 | HR 78 | Temp 98.1°F | Resp 16 | Ht 64.0 in | Wt 207.0 lb

## 2021-06-08 DIAGNOSIS — I10 Essential (primary) hypertension: Secondary | ICD-10-CM | POA: Diagnosis not present

## 2021-06-08 DIAGNOSIS — Z23 Encounter for immunization: Secondary | ICD-10-CM | POA: Diagnosis not present

## 2021-06-08 DIAGNOSIS — Z0001 Encounter for general adult medical examination with abnormal findings: Secondary | ICD-10-CM | POA: Diagnosis not present

## 2021-06-08 DIAGNOSIS — E7800 Pure hypercholesterolemia, unspecified: Secondary | ICD-10-CM

## 2021-06-08 DIAGNOSIS — Z8739 Personal history of other diseases of the musculoskeletal system and connective tissue: Secondary | ICD-10-CM | POA: Diagnosis not present

## 2021-06-08 DIAGNOSIS — Z0000 Encounter for general adult medical examination without abnormal findings: Secondary | ICD-10-CM

## 2021-06-08 NOTE — Progress Notes (Signed)
Subjective:    Patient ID: Connie Cisneros, female    DOB: 1951-05-02, 70 y.o.   MRN: [id number]  HPI  Patient is a very pleasant 70 year old Caucasian female here today for complete physical exam.  Her last colonoscopy was in 2019.  They recommended a repeat colonoscopy in 5 years due to her history of tubular adenomas.  She is due for that again in 2024.  She is not sure when she had her last bone density test.  This was performed by her gynecologist.  She is not taking calcium.  She is not taking vitamin D.  She had her mammogram earlier this year at her gynecologist and this was normal.  She does not require Pap smear.  She is due for a booster on the COVID shot.  Pneumovax and Prevnar up-to-date.  She is also due for a shingles vaccine as well as a flu shot.  She would like to get the flu shot today. No visits with results within 1 Month(s) from this visit.  Latest known visit with results is:  Office Visit on 05/04/2021  Component Date Value Ref Range Status   WBC 05/11/2021 5.4  3.8 - 10.8 Thousand/uL Final   RBC 05/11/2021 4.11  3.80 - 5.10 Million/uL Final   Hemoglobin 05/11/2021 12.5  11.7 - 15.5 g/dL Final   HCT 05/11/2021 37.9  35.0 - 45.0 % Final   MCV 05/11/2021 92.2  80.0 - 100.0 fL Final   MCH 05/11/2021 30.4  27.0 - 33.0 pg Final   MCHC 05/11/2021 33.0  32.0 - 36.0 g/dL Final   RDW 05/11/2021 13.8  11.0 - 15.0 % Final   Platelets 05/11/2021 226  140 - 400 Thousand/uL Final   MPV 05/11/2021 12.9 (A)  7.5 - 12.5 fL Final   Neutro Abs 05/11/2021 3,175  1,500 - 7,800 cells/uL Final   Lymphs Abs 05/11/2021 1,642  850 - 3,900 cells/uL Final   Absolute Monocytes 05/11/2021 394  200 - 950 cells/uL Final   Eosinophils Absolute 05/11/2021 140  15 - 500 cells/uL Final   Basophils Absolute 05/11/2021 49  0 - 200 cells/uL Final   Neutrophils Relative % 05/11/2021 58.8  % Final   Total Lymphocyte 05/11/2021 30.4  % Final   Monocytes Relative 05/11/2021 7.3  % Final   Eosinophils  Relative 05/11/2021 2.6  % Final   Basophils Relative 05/11/2021 0.9  % Final   Glucose, Bld 05/11/2021 89  65 - 99 mg/dL Final   Comment: .            Fasting reference interval .    BUN 05/11/2021 13  7 - 25 mg/dL Final   Creat 05/11/2021 0.97  0.50 - 1.05 mg/dL Final   eGFR 05/11/2021 64  > OR = 60 mL/min/1.82m Final   Comment: The eGFR is based on the CKD-EPI 2021 equation. To calculate  the new eGFR from a previous Creatinine or Cystatin C result, go to https://www.kidney.org/professionals/ kdoqi/gfr%5Fcalculator    BUN/Creatinine Ratio 185/27/7824NOT APPLICABLE  6 - 22 (calc) Final   Sodium 05/11/2021 138  135 - 146 mmol/L Final   Potassium 05/11/2021 3.4 (A)  3.5 - 5.3 mmol/L Final   Chloride 05/11/2021 100  98 - 110 mmol/L Final   CO2 05/11/2021 28  20 - 32 mmol/L Final   Calcium 05/11/2021 9.5  8.6 - 10.4 mg/dL Final   Total Protein 05/11/2021 7.0  6.1 - 8.1 g/dL Final   Albumin 05/11/2021 3.9  3.6 -  5.1 g/dL Final   Globulin 05/11/2021 3.1  1.9 - 3.7 g/dL (calc) Final   AG Ratio 05/11/2021 1.3  1.0 - 2.5 (calc) Final   Total Bilirubin 05/11/2021 0.5  0.2 - 1.2 mg/dL Final   Alkaline phosphatase (APISO) 05/11/2021 69  37 - 153 U/L Final   AST 05/11/2021 15  10 - 35 U/L Final   ALT 05/11/2021 12  6 - 29 U/L Final   Cholesterol 05/11/2021 157  <200 mg/dL Final   HDL 05/11/2021 51  > OR = 50 mg/dL Final   Triglycerides 05/11/2021 174 (A)  <150 mg/dL Final   LDL Cholesterol (Calc) 05/11/2021 79  mg/dL (calc) Final   Comment: Reference range: <100 . Desirable range <100 mg/dL for primary prevention;   <70 mg/dL for patients with CHD or diabetic patients  with > or = 2 CHD risk factors. Marland Kitchen LDL-C is now calculated using the Martin-Hopkins  calculation, which is a validated novel method providing  better accuracy than the Friedewald equation in the  estimation of LDL-C.  Cresenciano Genre et al. Annamaria Helling. 6759;163(84): 2061-2068  (http://education.[facility].com/faq/FAQ164)     Total CHOL/HDL Ratio 05/11/2021 3.1  <5.0 (calc) Final   Non-HDL Cholesterol (Calc) 05/11/2021 106  <130 mg/dL (calc) Final   Comment: For patients with diabetes plus 1 major ASCVD risk  factor, treating to a non-HDL-C goal of <100 mg/dL  (LDL-C of <70 mg/dL) is considered a therapeutic  option.    Patient denies any falls, depression, or memory loss Immunization History  Administered Date(s) Administered   Fluad Quad(high Dose 65+) 05/20/2019, 04/07/2020   Influenza,inj,Quad PF,6+ Mos 06/08/2013, 05/26/2017, 05/28/2018   Influenza-Unspecified 04/20/2012, 05/05/2016, 05/26/2017   Janssen (J&J) SARS-COV-2 Vaccination 11/12/2019   Pneumococcal Conjugate-13 03/08/2019   Pneumococcal Polysaccharide-23 04/07/2020   Unspecified SARS-COV-2 Vaccination 11/12/2019   Zoster, Live 08/05/2014    Past Medical History:  Diagnosis Date   Arthritis    Phreesia 04/05/2020   Atypical nevus 10/09/2006   slight-mderate-right upper thigh   Atypical nevus 01/24/2015   moderate-left back   Atypical nevus 02/22/2016   severe-right abdomen (WS)   Atypical nevus 02/22/2016   mild-right low abdomen (WS)   Cancer (HCC)    melanoma   Deep vein blood clot of right lower extremity (HCC)    leg   GERD (gastroesophageal reflux disease)    Heart murmur    History of migraine headaches    Hypertension    Left carotid artery stenosis    <20%   Melanoma ([place]) 09/25/2005   lentigo maligna-Left shin( [facility] surgery [facility])   Psoriatic arthritis ([place])    Rheumatoid arthritis ([place])     Past Surgical History:  Procedure Laterality Date   ABDOMINAL HYSTERECTOMY N/A    Phreesia 04/05/2020   SPINE SURGERY N/A    Phreesia 04/05/2020   TOTAL VAGINAL HYSTERECTOMY     Current Outpatient Medications on File Prior to Visit  Medication Sig Dispense Refill   atorvastatin (LIPITOR) 40 MG tablet TAKE 1 TABLET(40 MG) BY MOUTH DAILY AT 6 PM 90 tablet 3   carvedilol (COREG) 6.25 MG tablet TAKE 2 tablets (12.5 mg)  BY MOUTH in the AM, and 1 tablet (6.25 mg) in the PM. 180 tablet 3   cholecalciferol (VITAMIN D) 1000 UNITS tablet Take 1,000 Units by mouth daily.     diclofenac (VOLTAREN) 75 MG EC tablet Take 75 mg by mouth 2 (two) times daily.     estradiol (ESTRACE) 2 MG tablet Take 2 mg by  mouth daily.     fluticasone (FLONASE) 50 MCG/ACT nasal spray Place 2 sprays into both nostrils daily. 16 g 6   hydrochlorothiazide (MICROZIDE) 12.5 MG capsule Take 12.5 mg by mouth daily.     hydroxychloroquine (PLAQUENIL) 200 MG tablet Take 200 mg by mouth 2 (two) times daily.     levocetirizine (XYZAL) 5 MG tablet Take 1 tablet (5 mg total) by mouth every evening. 30 tablet 0   meclizine (ANTIVERT) 25 MG tablet Take 1 tablet (25 mg total) by mouth 3 (three) times daily as needed for dizziness. (Patient not taking: Reported on 05/04/2021) 30 tablet 0   omeprazole (PRILOSEC) 40 MG capsule Take 40 mg by mouth daily.   0   RESTASIS 0.05 % ophthalmic emulsion INSTILL 1 DROP INTO EACH EYE BID.     vitamin B-12 (CYANOCOBALAMIN) 100 MCG tablet Take 100 mcg by mouth daily.     YUVAFEM 10 MCG TABS vaginal tablet Place 10 mcg vaginally as needed. (Patient not taking: Reported on 05/04/2021)     No current facility-administered medications on file prior to visit.   Allergies  Allergen Reactions   Losartan     Unclear if it was losartan or verapamil that caused angioedema, both are stopped   Verapamil     Stopped by urgent care in 03/22/2016 for possible angioedema, losartan was stopped on 03/25/16 by cardiology as it is a more likely culprit   Codeine Nausea Only   Lisinopril Other (See Comments)    Sleepy and tired   Sulfa Antibiotics Nausea Only   Wellbutrin [Bupropion] Other (See Comments)    Reaction to losartan and verapamil   Social History   Socioeconomic History   Marital status: Married    Spouse name: Not on file   Number of children: Not on file   Years of education: Not on file   Highest education level:  Not on file  Occupational History   Not on file  Tobacco Use   Smoking status: Never   Smokeless tobacco: Never  Vaping Use   Vaping Use: Never used  Substance and Sexual Activity   Alcohol use: No   Drug use: Never   Sexual activity: Not on file  Other Topics Concern   Not on file  Social History Narrative   Not on file   Social Determinants of Health   Financial Resource Strain: Not on file  Food Insecurity: Not on file  Transportation Needs: Not on file  Physical Activity: Not on file  Stress: Not on file  Social Connections: Not on file  Intimate Partner Violence: Not on file   Family History  Problem Relation Age of Onset   Cancer Mother        breast, stomach   Diabetes Father    Heart disease Father    Cancer Sister    Heart disease Paternal Grandmother    Heart disease Paternal Grandfather       Review of Systems  All other systems reviewed and are negative.     Objective:   Physical Exam Vitals reviewed.  Constitutional:      General: She is not in acute distress.    Appearance: She is well-developed. She is not diaphoretic.  HENT:     Head: Normocephalic and atraumatic.     Right Ear: External ear normal.     Left Ear: External ear normal.     Nose: Nose normal.     Mouth/Throat:     Pharynx: No oropharyngeal  exudate.  Eyes:     Conjunctiva/sclera: Conjunctivae normal.     Pupils: Pupils are equal, round, and reactive to light.  Neck:     Thyroid: No thyromegaly.     Vascular: No JVD.  Cardiovascular:     Rate and Rhythm: Normal rate and regular rhythm.     Heart sounds: Murmur heard.    No friction rub. No gallop.  Pulmonary:     Effort: Pulmonary effort is normal. No respiratory distress.     Breath sounds: Normal breath sounds. No wheezing or rales.  Chest:     Chest wall: No tenderness.  Abdominal:     General: Bowel sounds are normal. There is no distension.     Palpations: Abdomen is soft. There is no mass.     Tenderness:  There is no abdominal tenderness. There is no guarding or rebound.  Musculoskeletal:        General: No tenderness or deformity. Normal range of motion.     Cervical back: Neck supple.  Lymphadenopathy:     Cervical: No cervical adenopathy.  Skin:    General: Skin is warm.     Coloration: Skin is not pale.     Findings: No erythema or rash.  Neurological:     Mental Status: She is alert and oriented to person, place, and time.     Cranial Nerves: No cranial nerve deficit.     Motor: No abnormal muscle tone.     Coordination: Coordination normal.     Deep Tendon Reflexes: Reflexes are normal and symmetric.  Psychiatric:        Behavior: Behavior normal.        Thought Content: Thought content normal.        Judgment: Judgment normal.          Assessment & Plan:  General medical exam  Essential hypertension  Pure hypercholesterolemia  History of rheumatoid arthritis Blood pressure is borderline.  However the remainder of her physical exam is completely normal.  I recommended a flu shot which she received today.  I recommended the Shingrix vaccine.  Also recommended a booster on her COVID shot.  Colonoscopy is due again in 2024.  Mammogram is up-to-date.  I do want to get a copy of her bone density test.  I would repeat this every 2 to 3 years.  Recommended calcium 1200 mg a day and vitamin D 1000 units a day.  Lab work as shown below and for the most part is outstanding: No visits with results within 1 Month(s) from this visit.  Latest known visit with results is:  Office Visit on 05/04/2021  Component Date Value Ref Range Status   WBC 05/11/2021 5.4  3.8 - 10.8 Thousand/uL Final   RBC 05/11/2021 4.11  3.80 - 5.10 Million/uL Final   Hemoglobin 05/11/2021 12.5  11.7 - 15.5 g/dL Final   HCT 05/11/2021 37.9  35.0 - 45.0 % Final   MCV 05/11/2021 92.2  80.0 - 100.0 fL Final   MCH 05/11/2021 30.4  27.0 - 33.0 pg Final   MCHC 05/11/2021 33.0  32.0 - 36.0 g/dL Final   RDW  05/11/2021 13.8  11.0 - 15.0 % Final   Platelets 05/11/2021 226  140 - 400 Thousand/uL Final   MPV 05/11/2021 12.9 (A)  7.5 - 12.5 fL Final   Neutro Abs 05/11/2021 3,175  1,500 - 7,800 cells/uL Final   Lymphs Abs 05/11/2021 1,642  850 - 3,900 cells/uL Final   Absolute Monocytes  05/11/2021 394  200 - 950 cells/uL Final   Eosinophils Absolute 05/11/2021 140  15 - 500 cells/uL Final   Basophils Absolute 05/11/2021 49  0 - 200 cells/uL Final   Neutrophils Relative % 05/11/2021 58.8  % Final   Total Lymphocyte 05/11/2021 30.4  % Final   Monocytes Relative 05/11/2021 7.3  % Final   Eosinophils Relative 05/11/2021 2.6  % Final   Basophils Relative 05/11/2021 0.9  % Final   Glucose, Bld 05/11/2021 89  65 - 99 mg/dL Final   Comment: .            Fasting reference interval .    BUN 05/11/2021 13  7 - 25 mg/dL Final   Creat 05/11/2021 0.97  0.50 - 1.05 mg/dL Final   eGFR 05/11/2021 64  > OR = 60 mL/min/1.67m Final   Comment: The eGFR is based on the CKD-EPI 2021 equation. To calculate  the new eGFR from a previous Creatinine or Cystatin C result, go to https://www.kidney.org/professionals/ kdoqi/gfr%5Fcalculator    BUN/Creatinine Ratio 116/24/4695NOT APPLICABLE  6 - 22 (calc) Final   Sodium 05/11/2021 138  135 - 146 mmol/L Final   Potassium 05/11/2021 3.4 (A)  3.5 - 5.3 mmol/L Final   Chloride 05/11/2021 100  98 - 110 mmol/L Final   CO2 05/11/2021 28  20 - 32 mmol/L Final   Calcium 05/11/2021 9.5  8.6 - 10.4 mg/dL Final   Total Protein 05/11/2021 7.0  6.1 - 8.1 g/dL Final   Albumin 05/11/2021 3.9  3.6 - 5.1 g/dL Final   Globulin 05/11/2021 3.1  1.9 - 3.7 g/dL (calc) Final   AG Ratio 05/11/2021 1.3  1.0 - 2.5 (calc) Final   Total Bilirubin 05/11/2021 0.5  0.2 - 1.2 mg/dL Final   Alkaline phosphatase (APISO) 05/11/2021 69  37 - 153 U/L Final   AST 05/11/2021 15  10 - 35 U/L Final   ALT 05/11/2021 12  6 - 29 U/L Final   Cholesterol 05/11/2021 157  <200 mg/dL Final   HDL 05/11/2021 51  >  OR = 50 mg/dL Final   Triglycerides 05/11/2021 174 (A)  <150 mg/dL Final   LDL Cholesterol (Calc) 05/11/2021 79  mg/dL (calc) Final   Comment: Reference range: <100 . Desirable range <100 mg/dL for primary prevention;   <70 mg/dL for patients with CHD or diabetic patients  with > or = 2 CHD risk factors. .Marland KitchenLDL-C is now calculated using the Martin-Hopkins  calculation, which is a validated novel method providing  better accuracy than the Friedewald equation in the  estimation of LDL-C.  MCresenciano Genreet al. JAnnamaria Helling 20722;575(05: 2061-2068  (http://education.[facility].com/faq/FAQ164)    Total CHOL/HDL Ratio 05/11/2021 3.1  <5.0 (calc) Final   Non-HDL Cholesterol (Calc) 05/11/2021 106  <130 mg/dL (calc) Final   Comment: For patients with diabetes plus 1 major ASCVD risk  factor, treating to a non-HDL-C goal of <100 mg/dL  (LDL-C of <70 mg/dL) is considered a therapeutic  option.

## 2021-10-01 ENCOUNTER — Other Ambulatory Visit: Payer: Self-pay | Admitting: Cardiovascular Disease

## 2021-10-01 ENCOUNTER — Other Ambulatory Visit: Payer: Self-pay | Admitting: Family Medicine

## 2021-10-19 ENCOUNTER — Telehealth: Payer: Self-pay

## 2021-10-19 ENCOUNTER — Other Ambulatory Visit: Payer: Self-pay | Admitting: Family Medicine

## 2021-10-19 MED ORDER — AMOXICILLIN-POT CLAVULANATE 875-125 MG PO TABS: 1.0000 | ORAL_TABLET | Freq: Two times a day (BID) | ORAL | 0 refills | Status: DC

## 2021-10-19 NOTE — Telephone Encounter (Signed)
Patient called to report she has been having allergy issues for several months. She states her nasal passages feel dry but she is congested. She has been taking an OTC allergy medication, is not sure which one, and using Flonase. She denies fever, chills, headache or facial pain/pressure. ? ?She would like to know what else you recommend. ? ?Please advise, thanks! ?

## 2021-10-19 NOTE — Telephone Encounter (Signed)
Patient aware of rx. Also aware if symptoms not improving she will need to schedule OV. Nothing further needed at this time.  ? ?

## 2022-01-10 IMAGING — CT CT HEART MORP W/ CTA COR W/ SCORE W/ CA W/CM &/OR W/O CM
4 of 7 series · 8 of 20 positions shown, 9 images · IV contrast (omnipaque)
Comparison: None.

Addendum:
HISTORY: 68 yo female with shortness of breath

EXAM:
Cardiac/Coronary CTA
TECHNIQUE: The patient was scanned on a Siemens Force scanner.
PROTOCOL: A 120 kV prospective scan was triggered in the descending thoracic
aorta at 111 HU's. Axial non-contrast 3 mm slices were carried out
through the heart. The data set was analyzed on a dedicated work
station and scored using the Agatson method. Gantry rotation speed
was 250 msecs and collimation was .6 mm. Beta blockade and 0.8 mg of
sl NTG was given. The 3D data set was reconstructed in 5% intervals
of the 67-82 % of the R-R cycle. Diastolic phases were analyzed on a
dedicated work station using MPR, MIP and VRT modes. The patient
received 80mL OMNIPAQUE IOHEXOL 350 MG/ML SOLN of contrast.

[Series 7: best diast 72 % · axial · 0.39mm/px · z∈[-114,-71]mm · 2 of 321 slices shown]
[im 107/321  vessel]
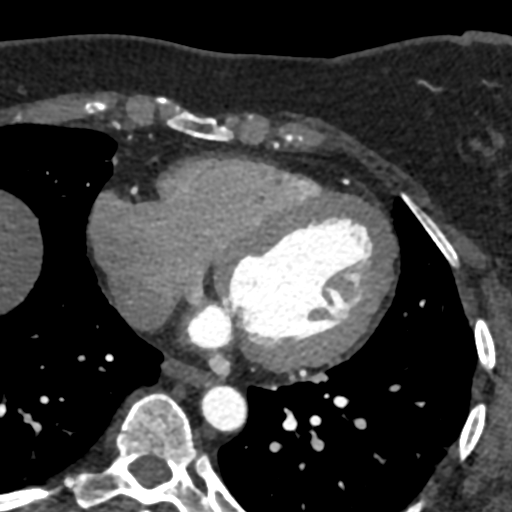
[im 214/321  vessel]
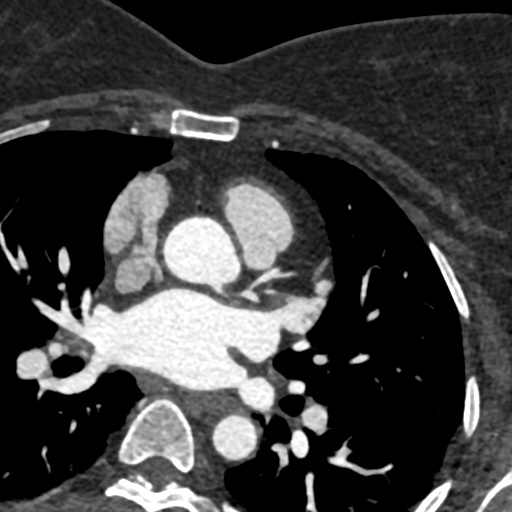

[Series 10: best syst · axial · 0.39mm/px · z∈[-114,-71]mm · 2 of 321 slices shown, 3 images]
[im 107/321  vessel]
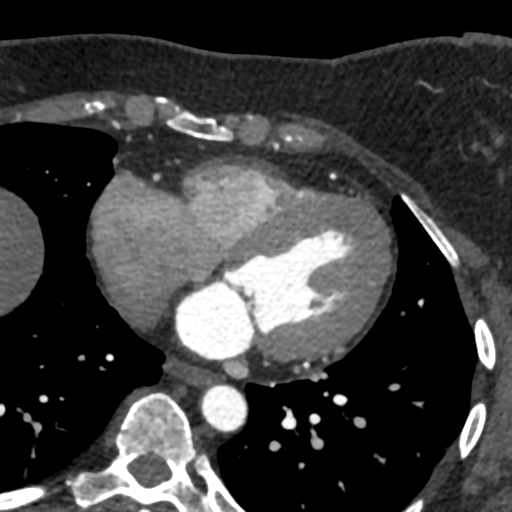
[im 107/321  lung]
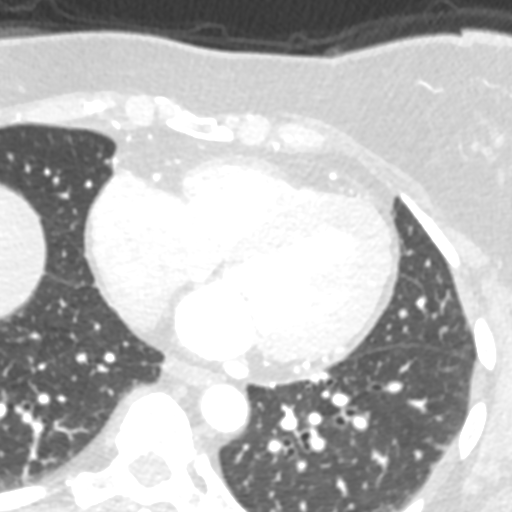
[im 214/321  vessel]
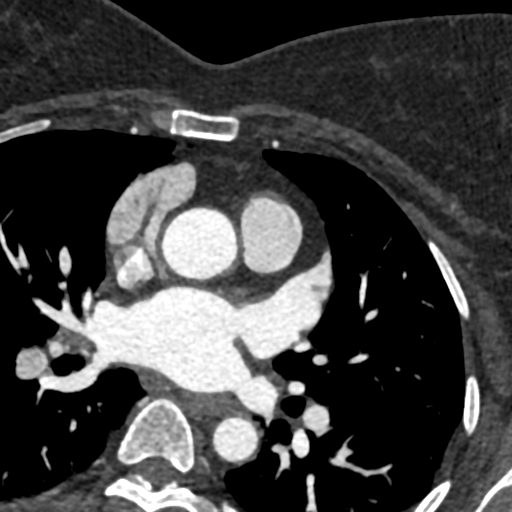

[Series 11: ts diast sharp 72 % · axial · 0.39mm/px · z∈[-114,-71]mm · 2 of 321 slices shown]
[im 107/321  lung]
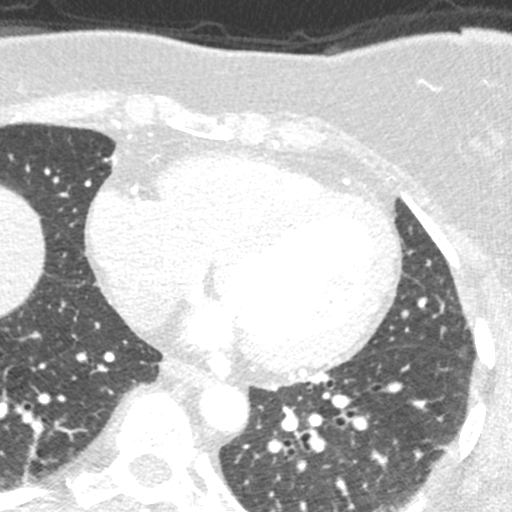
[im 214/321  lung]
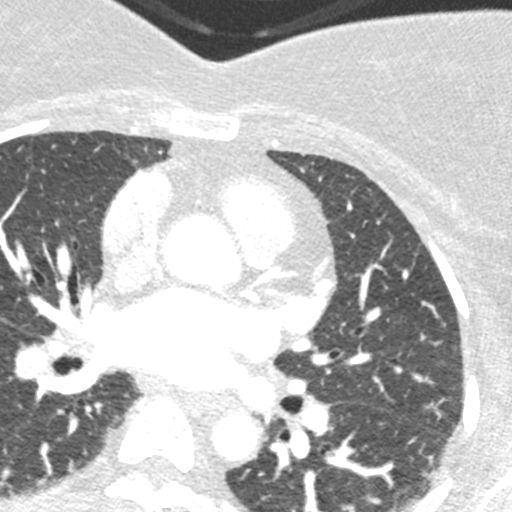

[Series 12: ts syst sharp · axial · 0.39mm/px · z∈[-114,-71]mm · 2 of 321 slices shown]
[im 107/321  lung]
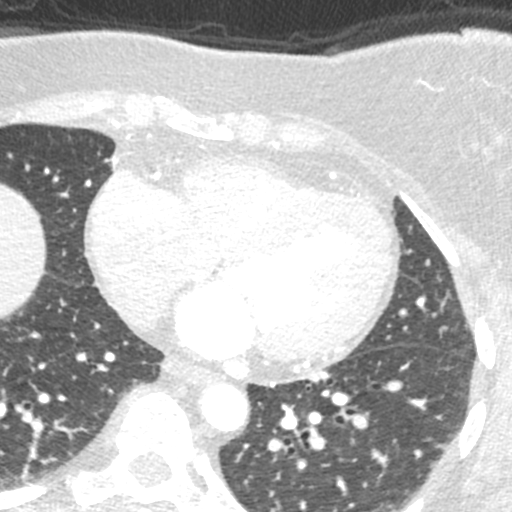
[im 214/321  lung]
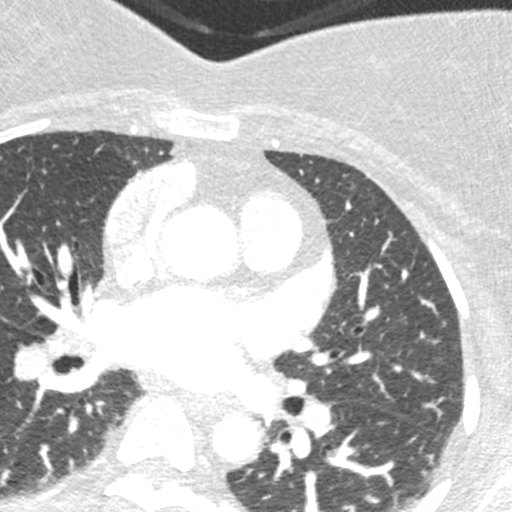

[8 of 20 positions shown; findings below may reference images not displayed]

FINDINGS: Quality: Good, HR 77

Coronary calcium score: The patient's coronary artery calcium score
is 0, which places the patient in the 0 percentile.

Coronary arteries: Normal coronary origins.  Right dominance.

Right Coronary Artery: Dominant.  Normal.

Left Main Coronary Artery: Normal. Bifurcates into the LAD and LCx
arteries.

Left Anterior Descending Coronary Artery: Moderate sized artery that
coarses anteriorly and reaches the apex. No disease. Small high D1
and D2 branches without stenosis.

Left Circumflex Artery: AV groove vessel without disease.

Aorta: Normal size, 30 mm at the mid ascending aorta (level of the
PA bifurcation) measured double oblique. No calcifications. No
dissection.

Aortic Valve: Trileaflet.  No calcifications.

Other findings:

Normal pulmonary vein drainage into the left atrium.

Normal left atrial appendage without a thrombus.

Normal size of the pulmonary artery.
IMPRESSION: 1. No evidence of CAD, CADRADS = 0.

2. Coronary calcium score of 0. This was 0 percentile for age and
sex matched control.

3. Normal coronary origin with right dominance.

EXAM:
OVER-READ INTERPRETATION  CT CHEST

The following report is an over-read performed by radiologist Dr.
over-read does not include interpretation of cardiac or coronary
anatomy or pathology. The coronary calcium score and cardiac CTA
interpretation by the cardiologist is attached.
FINDINGS: Aortic atherosclerosis. Within the visualized portions of the thorax
there are no suspicious appearing pulmonary nodules or masses, there
is no acute consolidative airspace disease, no pleural effusions, no
pneumothorax and no lymphadenopathy. Visualized portions of the
upper abdomen are unremarkable. There are no aggressive appearing
lytic or blastic lesions noted in the visualized portions of the
skeleton.
IMPRESSION: 1.  Aortic Atherosclerosis ([id number]).

*** End of Addendum ***
FINDINGS: Quality: Good, HR 77

Coronary calcium score: The patient's coronary artery calcium score
is 0, which places the patient in the 0 percentile.

Coronary arteries: Normal coronary origins.  Right dominance.

Right Coronary Artery: Dominant.  Normal.

Left Main Coronary Artery: Normal. Bifurcates into the LAD and LCx
arteries.

Left Anterior Descending Coronary Artery: Moderate sized artery that
coarses anteriorly and reaches the apex. No disease. Small high D1
and D2 branches without stenosis.

Left Circumflex Artery: AV groove vessel without disease.

Aorta: Normal size, 30 mm at the mid ascending aorta (level of the
PA bifurcation) measured double oblique. No calcifications. No
dissection.

Aortic Valve: Trileaflet.  No calcifications.

Other findings:

Normal pulmonary vein drainage into the left atrium.

Normal left atrial appendage without a thrombus.

Normal size of the pulmonary artery.
IMPRESSION: 1. No evidence of CAD, CADRADS = 0.

2. Coronary calcium score of 0. This was 0 percentile for age and
sex matched control.

3. Normal coronary origin with right dominance.

## 2022-01-31 ENCOUNTER — Ambulatory Visit: Payer: 59 | Admitting: Physician Assistant

## 2022-02-07 ENCOUNTER — Ambulatory Visit: Payer: 59 | Admitting: Physician Assistant

## 2022-02-07 DIAGNOSIS — L820 Inflamed seborrheic keratosis: Secondary | ICD-10-CM | POA: Diagnosis not present

## 2022-02-07 DIAGNOSIS — Z1283 Encounter for screening for malignant neoplasm of skin: Secondary | ICD-10-CM

## 2022-02-07 DIAGNOSIS — D485 Neoplasm of uncertain behavior of skin: Secondary | ICD-10-CM

## 2022-02-07 DIAGNOSIS — R202 Paresthesia of skin: Secondary | ICD-10-CM | POA: Diagnosis not present

## 2022-02-07 DIAGNOSIS — Z86006 Personal history of melanoma in-situ: Secondary | ICD-10-CM

## 2022-02-07 DIAGNOSIS — L570 Actinic keratosis: Secondary | ICD-10-CM

## 2022-02-07 NOTE — Patient Instructions (Signed)

## 2022-02-25 ENCOUNTER — Encounter: Payer: Self-pay | Admitting: Physician Assistant

## 2022-02-25 NOTE — Progress Notes (Signed)
   Follow-Up Visit   Subjective  Connie Cisneros is a 71 y.o. female who presents for the following: Annual Exam (Here for annual skin exam. Personal history of melanoma in situ. Concerns left and right side abdomen. ).   The following portions of the chart were reviewed this encounter and updated as appropriate:  Tobacco  Allergies  Meds  Problems  Med Hx  Surg Hx  Fam Hx      Objective  Well appearing patient in no apparent distress; mood and affect are within normal limits.  A full examination was performed including scalp, head, eyes, ears, nose, lips, neck, chest, axillae, abdomen, back, buttocks, bilateral upper extremities, bilateral lower extremities, hands, feet, fingers, toes, fingernails, and toenails. All findings within normal limits unless otherwise noted below.  No atypical nevi No signs of non-mole skin cancer.   Neurogenetic itch with out skin presentation  Left Lower Leg - Anterior clear  Dorsum of Nose Erythematous patches with gritty scale.  Left Flank Thick, dark plaque     Right Flank Thick dark plaque      Assessment & Plan  Screening for malignant neoplasm of skin  Yearly skin exams.  Personal history of melanoma in-situ Left Lower Leg - Anterior  Annual skin examinations  AK (actinic keratosis) Dorsum of Nose  Destruction of lesion - Dorsum of Nose Complexity: simple   Destruction method: cryotherapy   Informed consent: discussed and consent obtained   Timeout:  patient name, date of birth, surgical site, and procedure verified Lesion destroyed using liquid nitrogen: Yes   Outcome: patient tolerated procedure well with no complications    Neoplasm of uncertain behavior of skin (2) Left Flank  Skin / nail biopsy Type of biopsy: tangential   Informed consent: discussed and consent obtained   Timeout: patient name, date of birth, surgical site, and procedure verified   Anesthesia: the lesion was anesthetized in a standard  fashion   Anesthetic:  1% lidocaine w/ epinephrine 1-100,000 local infiltration Instrument used: flexible razor blade   Hemostasis achieved with: ferric subsulfate   Outcome: patient tolerated procedure well   Post-procedure details: wound care instructions given    Specimen 1 - Surgical pathology Differential Diagnosis: SK  Check Margins: No  Right Flank  Skin / nail biopsy Type of biopsy: tangential   Informed consent: discussed and consent obtained   Timeout: patient name, date of birth, surgical site, and procedure verified   Anesthesia: the lesion was anesthetized in a standard fashion   Anesthetic:  1% lidocaine w/ epinephrine 1-100,000 local infiltration Instrument used: flexible razor blade   Hemostasis achieved with: ferric subsulfate   Outcome: patient tolerated procedure well   Post-procedure details: wound care instructions given    Specimen 2 - Surgical pathology Differential Diagnosis: SK  Check Margins: No  Notalgia paresthetica  No great treatment options    I, Keaden Gunnoe, PA-C, have reviewed all documentation's for this visit.  The documentation on 02/25/22 for the exam, diagnosis, procedures and orders are all accurate and complete.

## 2022-05-10 ENCOUNTER — Ambulatory Visit: Payer: 59 | Admitting: Family Medicine

## 2022-05-10 VITALS — BP 126/78 | HR 75 | Temp 98.5°F | Wt 212.0 lb

## 2022-05-10 DIAGNOSIS — L404 Guttate psoriasis: Secondary | ICD-10-CM | POA: Diagnosis not present

## 2022-05-10 MED ORDER — MOMETASONE FUROATE 0.1 % EX CREA: TOPICAL_CREAM | Freq: Every day | CUTANEOUS | 1 refills | Status: DC

## 2022-05-10 NOTE — Progress Notes (Signed)
Subjective:    Patient ID: Connie Cisneros, female    DOB: April 26, 1951, 71 y.o.   MRN: [id number]   Patient has a history of psoriatic arthritis.  For the last 3 to 4 months, she has been dealing with a pink violaceous guttate rash on her anterior chest that itches.  There is fine white silvery scale.  Is now starting to spread to her upper back.  There is no rash on her arms or legs.  She is currently on Plaquenil for psoriatic arthritis. Past Medical History:  Diagnosis Date  . Arthritis    [employer]reesia 04/05/2020  . Atypical nevus 10/09/2006   slight-mderate-right upper thigh  . Atypical nevus 01/24/2015   moderate-left back  . Atypical nevus 02/22/2016   severe-right abdomen ([facility])  . Atypical nevus 02/22/2016   mild-right low abdomen ([facility])  . Cancer ([place])    melanoma  . 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 GERD (gastroesophageal reflux disease)   . Heart murmur   . History of migraine headaches   . Hypertension   . Left carotid artery stenosis    <20%  . 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 Psoriatic arthritis ([place])   . Rheumatoid arthritis [facility])    Past Surgical History:  Procedure Laterality Date  . ABDOMINAL HYSTERECTOMY N/A    [employer]reesia 04/05/2020  . SPINE SURGERY N/A    [employer]reesia 04/05/2020  . TOTAL VAGINAL HYSTERECTOMY     Current Outpatient Medications on File Prior to Visit  Medication Sig Dispense Refill  . amoxicillin-clavulanate (AUGMENTIN) 875-125 MG tablet Take 1 tablet by mouth 2 (two) times daily. 20 tablet 0  . atorvastatin (LIPITOR) 40 MG tablet TAKE 1 TABLET(40 MG) BY MOUTH DAILY AT 6 PM 90 tablet 3  . carvedilol (COREG) 6.25 MG tablet TAKE 1 TABLET(6.25 MG) BY MOUTH TWICE DAILY WITH A MEAL 180 tablet 3  . cholecalciferol (VITAMIN D) 1000 UNITS tablet Take 1,000 Units by mouth daily.    . diclofenac (VOLTAREN) 75 MG EC tablet Take 75 mg by mouth 2 (two) times daily.    Marland Kitchen estradiol  (ESTRACE) 2 MG tablet Take 2 mg by mouth daily.    . fluticasone (FLONASE) 50 MCG/ACT nasal spray Place 2 sprays into both nostrils daily. 16 g 6  . hydrochlorothiazide (MICROZIDE) 12.5 MG capsule TAKE 1 CAPSULE(12.5 MG) BY MOUTH DAILY 90 capsule 3  . hydroxychloroquine (PLAQUENIL) 200 MG tablet Take 200 mg by mouth 2 (two) times daily.    Marland Kitchen levocetirizine (XYZAL) 5 MG tablet Take 1 tablet (5 mg total) by mouth every evening. 30 tablet 0  . omeprazole (PRILOSEC) 40 MG capsule Take 40 mg by mouth daily.   0  . RESTASIS 0.05 % ophthalmic emulsion INSTILL 1 DROP INTO EACH EYE BID.    Marland Kitchen vitamin B-12 (CYANOCOBALAMIN) 100 MCG tablet Take 100 mcg by mouth daily.    Merril Abbe 10 MCG TABS vaginal tablet Place 10 mcg vaginally as needed.     No current facility-administered medications on file prior to visit.   Allergies  Allergen Reactions  . Losartan     Unclear if it was losartan or verapamil that caused angioedema, both are stopped  . Verapamil     Stopped by urgent care in 03/22/2016 for possible angioedema, losartan was stopped on 03/25/16 by cardiology as it is a more likely culprit  . Codeine Nausea Only  . Lisinopril  Other (See Comments)    Sleepy and tired  . Sulfa Antibiotics Nausea Only  . Wellbutrin [Bupropion] Other (See Comments)    Reaction to losartan and verapamil   Social History   Socioeconomic History  . Marital status: Married    Spouse name: Not on file  . Number of children: Not on file  . Years of education: Not on file  . Highest education level: Not on file  Occupational History  . Not on file  Tobacco Use  . Smoking status: Never  . Smokeless tobacco: Never  Vaping Use  . Vaping Use: Never used  Substance and Sexual Activity  . Alcohol use: No  . Drug use: Never  . Sexual activity: Not on file  Other Topics Concern  . Not on file  Social History Narrative  . Not on file   Social Determinants of Health   Financial Resource Strain: Not on file  Food  Insecurity: Not on file  Transportation Needs: Not on file  Physical Activity: Not on file  Stress: Not on file  Social Connections: Not on file  Intimate Partner Violence: Not on file      Review of Systems  Skin:  Positive for rash.  All other systems reviewed and are negative.      Objective:   Physical Exam Vitals reviewed.  Constitutional:      Appearance: Normal appearance. She is normal weight.  HENT:     Right Ear: Tympanic membrane and ear canal normal.     Left Ear: Tympanic membrane and ear canal normal.  Cardiovascular:     Rate and Rhythm: Normal rate and regular rhythm.     Heart sounds: Normal heart sounds.  Pulmonary:     Effort: Pulmonary effort is normal.     Breath sounds: Normal breath sounds.  Skin:    Findings: Rash present. Rash is papular and scaling.       Neurological:     Mental Status: She is alert.       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  Recommend Elocon cream 1-2 times daily to help control the rash and I will consult dermatology

## 2022-05-17 ENCOUNTER — Ambulatory Visit: Payer: 59 | Admitting: Family Medicine

## 2022-06-07 ENCOUNTER — Other Ambulatory Visit: Payer: 59

## 2022-06-07 DIAGNOSIS — I10 Essential (primary) hypertension: Secondary | ICD-10-CM

## 2022-06-07 DIAGNOSIS — E041 Nontoxic single thyroid nodule: Secondary | ICD-10-CM

## 2022-06-07 DIAGNOSIS — E7800 Pure hypercholesterolemia, unspecified: Secondary | ICD-10-CM

## 2022-06-08 LAB — LIPID PANEL
Cholesterol: 161 mg/dL (ref <200)
HDL: 53 mg/dL (ref >=50)
LDL Cholesterol (Calc): 81 mg/dL (calc)
Non-HDL Cholesterol (Calc): 108 mg/dL (calc) (ref <130)
Total CHOL/HDL Ratio: 3 (calc) (ref <5.0)
Triglycerides: 174 mg/dL — ABNORMAL HIGH (ref <150)

## 2022-06-08 LAB — COMPLETE METABOLIC PANEL WITH GFR
AG Ratio: 1.1 (calc) (ref 1.0–2.5)
ALT: 10 U/L (ref 6–29)
AST: 14 U/L (ref 10–35)
Albumin: 3.6 g/dL (ref 3.6–5.1)
Alkaline phosphatase (APISO): 67 U/L (ref 37–153)
BUN: 14 mg/dL (ref 7–25)
CO2: 24 mmol/L (ref 20–32)
Calcium: 8.9 mg/dL (ref 8.6–10.4)
Chloride: 102 mmol/L (ref 98–110)
Creat: 0.96 mg/dL (ref 0.50–1.05)
Globulin: 3.2 g/dL (calc) (ref 1.9–3.7)
Glucose, Bld: 96 mg/dL (ref 65–99)
Potassium: 3.5 mmol/L (ref 3.5–5.3)
Sodium: 139 mmol/L (ref 135–146)
Total Bilirubin: 0.3 mg/dL (ref 0.2–1.2)
Total Protein: 6.8 g/dL (ref 6.1–8.1)
eGFR: 64 mL/min/{1.73_m2} (ref >=60)

## 2022-06-08 LAB — CBC WITH DIFFERENTIAL/PLATELET
Absolute Monocytes: 372 cells/uL (ref 200–950)
Basophils Absolute: 49 cells/uL (ref 0–200)
Basophils Relative: 1 %
Eosinophils Absolute: 78 cells/uL (ref 15–500)
Eosinophils Relative: 1.6 %
HCT: 36 % (ref 35.0–45.0)
Hemoglobin: 12.3 g/dL (ref 11.7–15.5)
Lymphs Abs: 1622 cells/uL (ref 850–3900)
MCH: 31.5 pg (ref 27.0–33.0)
MCHC: 34.2 g/dL (ref 32.0–36.0)
MCV: 92.1 fL (ref 80.0–100.0)
MPV: 12.7 fL — ABNORMAL HIGH (ref 7.5–12.5)
Monocytes Relative: 7.6 %
Neutro Abs: 2778 cells/uL (ref 1500–7800)
Neutrophils Relative %: 56.7 %
Platelets: 227 10*3/uL (ref 140–400)
RBC: 3.91 10*6/uL (ref 3.80–5.10)
RDW: 13.9 % (ref 11.0–15.0)
Total Lymphocyte: 33.1 %
WBC: 4.9 10*3/uL (ref 3.8–10.8)

## 2022-06-08 LAB — THYROID PANEL WITH TSH
Free Thyroxine Index: 2.4 (ref 1.4–3.8)
T3 Uptake: 22 % (ref 22–35)
T4, Total: 10.7 ug/dL (ref 5.1–11.9)
TSH: 3.59 mIU/L (ref 0.40–4.50)

## 2022-06-14 ENCOUNTER — Ambulatory Visit (INDEPENDENT_AMBULATORY_CARE_PROVIDER_SITE_OTHER): Payer: 59 | Admitting: Family Medicine

## 2022-06-14 VITALS — BP 140/90 | HR 83 | Temp 97.8°F | Ht 64.0 in | Wt 212.0 lb

## 2022-06-14 DIAGNOSIS — Z780 Asymptomatic menopausal state: Secondary | ICD-10-CM | POA: Diagnosis not present

## 2022-06-14 DIAGNOSIS — E7800 Pure hypercholesterolemia, unspecified: Secondary | ICD-10-CM

## 2022-06-14 DIAGNOSIS — I10 Essential (primary) hypertension: Secondary | ICD-10-CM | POA: Diagnosis not present

## 2022-06-14 DIAGNOSIS — Z0000 Encounter for general adult medical examination without abnormal findings: Secondary | ICD-10-CM

## 2022-06-14 NOTE — Progress Notes (Signed)
Subjective:    Patient ID: Connie Cisneros, female    DOB: [birth year], 71 y.o.   MRN: [id number]  HPI  Patient is a very pleasant 71 year old Caucasian female here today for complete physical exam.  Her last colonoscopy was in 2019.  They recommended a repeat colonoscopy in 5 years due to her history of tubular adenomas.  She is due for that again in 2024.  Patient is due for a flu shot.  She is due for a shingles shot.  She is also due for a COVID booster.  Her mammogram was performed this morning.  She is due for a bone density test.  She denies any falls, depression, or memory loss.  Her most recent lab work is listed below.  Otherwise she is doing well with no concerns. Immunization History  Administered Date(s) Administered   Fluad Quad(high Dose 65+) 05/20/2019, 04/07/2020, 06/08/2021   Influenza,inj,Quad PF,6+ Mos 06/08/2013, 05/26/2017, 05/28/2018   Influenza-Unspecified 04/20/2012, 05/05/2016, 05/26/2017   Janssen (J&J) SARS-COV-2 Vaccination 11/12/2019   Pneumococcal Conjugate-13 03/08/2019   Pneumococcal Polysaccharide-23 04/07/2020   Zoster, Live 08/05/2014    Lab on 06/07/2022  Component Date Value Ref Range Status   WBC 06/07/2022 4.9  3.8 - 10.8 Thousand/uL Final   RBC 06/07/2022 3.91  3.80 - 5.10 Million/uL Final   Hemoglobin 06/07/2022 12.3  11.7 - 15.5 g/dL Final   HCT 06/07/2022 36.0  35.0 - 45.0 % Final   MCV 06/07/2022 92.1  80.0 - 100.0 fL Final   MCH 06/07/2022 31.5  27.0 - 33.0 pg Final   MCHC 06/07/2022 34.2  32.0 - 36.0 g/dL Final   RDW 06/07/2022 13.9  11.0 - 15.0 % Final   Platelets 06/07/2022 227  140 - 400 Thousand/uL Final   MPV 06/07/2022 12.7 (H)  7.5 - 12.5 fL Final   Neutro Abs 06/07/2022 2,778  1,500 - 7,800 cells/uL Final   Lymphs Abs 06/07/2022 1,622  850 - 3,900 cells/uL Final   Absolute Monocytes 06/07/2022 372  200 - 950 cells/uL Final   Eosinophils Absolute 06/07/2022 78  15 - 500 cells/uL Final   Basophils Absolute 06/07/2022 49  0 - 200  cells/uL Final   Neutrophils Relative % 06/07/2022 56.7  % Final   Total Lymphocyte 06/07/2022 33.1  % Final   Monocytes Relative 06/07/2022 7.6  % Final   Eosinophils Relative 06/07/2022 1.6  % Final   Basophils Relative 06/07/2022 1.0  % Final   Glucose, Bld 06/07/2022 96  65 - 99 mg/dL Final   Comment: .            Fasting reference interval .    BUN 06/07/2022 14  7 - 25 mg/dL Final   Creat 06/07/2022 0.96  0.50 - 1.05 mg/dL Final   eGFR 06/07/2022 64  > OR = 60 mL/min/1.30m Final   BUN/Creatinine Ratio 06/07/2022 SEE NOTE:  6 - 22 (calc) Final   Comment:    Not Reported: BUN and Creatinine are within    reference range. .    Sodium 06/07/2022 139  135 - 146 mmol/L Final   Potassium 06/07/2022 3.5  3.5 - 5.3 mmol/L Final   Chloride 06/07/2022 102  98 - 110 mmol/L Final   CO2 06/07/2022 24  20 - 32 mmol/L Final   Calcium 06/07/2022 8.9  8.6 - 10.4 mg/dL Final   Total Protein 06/07/2022 6.8  6.1 - 8.1 g/dL Final   Albumin 06/07/2022 3.6  3.6 - 5.1 g/dL Final  Globulin 06/07/2022 3.2  1.9 - 3.7 g/dL (calc) Final   AG Ratio 06/07/2022 1.1  1.0 - 2.5 (calc) Final   Total Bilirubin 06/07/2022 0.3  0.2 - 1.2 mg/dL Final   Alkaline phosphatase (APISO) 06/07/2022 67  37 - 153 U/L Final   AST 06/07/2022 14  10 - 35 U/L Final   ALT 06/07/2022 10  6 - 29 U/L Final   Cholesterol 06/07/2022 161  <200 mg/dL Final   HDL 06/07/2022 53  > OR = 50 mg/dL Final   Triglycerides 06/07/2022 174 (H)  <150 mg/dL Final   LDL Cholesterol (Calc) 06/07/2022 81  mg/dL (calc) Final   Comment: Reference range: <100 . Desirable range <100 mg/dL for primary prevention;   <70 mg/dL for patients with CHD or diabetic patients  with > or = 2 CHD risk factors. Marland Kitchen LDL-C is now calculated using the Martin-Hopkins  calculation, which is a validated novel method providing  better accuracy than the Friedewald equation in the  estimation of LDL-C.  Cresenciano Genre et al. Annamaria Helling. 3893;734(28): 2061-2068   (http://education.[facility].com/faq/FAQ164)    Total CHOL/HDL Ratio 06/07/2022 3.0  <5.0 (calc) Final   Non-HDL Cholesterol (Calc) 06/07/2022 108  <130 mg/dL (calc) Final   Comment: For patients with diabetes plus 1 major ASCVD risk  factor, treating to a non-HDL-C goal of <100 mg/dL  (LDL-C of <70 mg/dL) is considered a therapeutic  option.    T3 Uptake 06/07/2022 22  22 - 35 % Final   T4, Total 06/07/2022 10.7  5.1 - 11.9 mcg/dL Final   Free Thyroxine Index 06/07/2022 2.4  1.4 - 3.8 Final   TSH 06/07/2022 3.59  0.40 - 4.50 mIU/L Final   Patient denies any falls, depression, or memory loss Immunization History  Administered Date(s) Administered   Fluad Quad(high Dose 65+) 05/20/2019, 04/07/2020, 06/08/2021   Influenza,inj,Quad PF,6+ Mos 06/08/2013, 05/26/2017, 05/28/2018   Influenza-Unspecified 04/20/2012, 05/05/2016, 05/26/2017   Janssen (J&J) SARS-COV-2 Vaccination 11/12/2019   Pneumococcal Conjugate-13 03/08/2019   Pneumococcal Polysaccharide-23 04/07/2020   Zoster, Live 08/05/2014    Past Medical History:  Diagnosis Date   Arthritis    [facility] 04/05/2020   Atypical nevus 10/09/2006   slight-mderate-right upper thigh   Atypical nevus 01/24/2015   moderate-left back   Atypical nevus 02/22/2016   severe-right abdomen ([facility])   Atypical nevus 02/22/2016   mild-right low abdomen (WS)   Cancer (HCC)    melanoma   Deep vein blood clot of right lower extremity (HCC)    leg   GERD (gastroesophageal reflux disease)    Heart murmur    History of migraine headaches    Hypertension    Left carotid artery stenosis    <20%   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 20 tablet 0   atorvastatin (LIPITOR) 40 MG tablet TAKE 1 TABLET(40 MG) BY MOUTH DAILY AT 6 PM 90 tablet 3   carvedilol (COREG) 6.25 MG tablet TAKE 1 TABLET(6.25 MG) BY MOUTH TWICE DAILY WITH A MEAL  180 tablet 3   cholecalciferol (VITAMIN D) 1000 UNITS tablet Take 1,000 Units by mouth daily.     diclofenac (VOLTAREN) 75 MG EC tablet Take 75 mg by mouth 2 (two) times daily.     estradiol (ESTRACE) 2 MG tablet Take 2 mg by mouth daily.     fluticasone (FLONASE) 50 MCG/ACT nasal spray Place 2 sprays into both nostrils daily. 16 g 6   hydrochlorothiazide (MICROZIDE) 12.5 MG capsule TAKE 1 CAPSULE(12.5 MG) BY MOUTH DAILY 90 capsule 3   hydroxychloroquine (PLAQUENIL) 200 MG tablet Take 200 mg by mouth 2 (two) times daily.     levocetirizine (XYZAL) 5 MG tablet Take 1 tablet (5 mg total) by mouth every evening. 30 tablet 0   mometasone (ELOCON) 0.1 % cream Apply topically daily. 45 g 1   omeprazole (PRILOSEC) 40 MG capsule Take 40 mg by mouth daily.   0   RESTASIS 0.05 % ophthalmic emulsion INSTILL 1 DROP INTO EACH EYE BID.     vitamin B-12 (CYANOCOBALAMIN) 100 MCG tablet Take 100 mcg by mouth daily.     YUVAFEM 10 MCG TABS vaginal tablet Place 10 mcg vaginally as needed.     No current facility-administered medications on file prior to visit.   Allergies  Allergen Reactions   Losartan     Unclear if it was losartan or verapamil that caused angioedema, both are stopped   Verapamil     Stopped by urgent care in 03/22/2016 for possible angioedema, losartan was stopped on 03/25/16 by cardiology as it is a more likely culprit   Codeine Nausea Only   Lisinopril Other (See Comments)    Sleepy and tired   Sulfa Antibiotics Nausea Only   Wellbutrin [Bupropion] Other (See Comments)    Reaction to losartan and verapamil   Social History    Socioeconomic History   Marital status: Married    Spouse name: Not on file   Number of children: Not on file   Years of education: Not on file   Highest education level: Not on file  Occupational History   Not on file  Tobacco Use   Smoking status: Never   Smokeless tobacco: Never  Vaping Use   Vaping Use: Never used  Substance and Sexual Activity   Alcohol use: No   Drug use: Never   Sexual activity: Not on file  Other Topics Concern   Not on file  Social History Narrative   Not on file   Social Determinants of Health   Financial Resource Strain: Not on file  Food Insecurity: Not on file  Transportation Needs: Not on file  Physical Activity: Not on file  Stress: Not on file  Social Connections: Not on file  Intimate Partner Violence: Not on file   Family History  Problem Relation Age of Onset   Cancer Mother        breast, stomach   Diabetes Father    Heart disease Father    Cancer Sister    Heart disease Paternal Grandmother    Heart disease Paternal Grandfather       Review of Systems  All other systems reviewed and are negative.      Objective:   Physical Exam Vitals reviewed.  Constitutional:      General: She is not in acute distress.    Appearance: She is well-developed. She is not diaphoretic.  HENT:     Head: Normocephalic and atraumatic.     Right Ear: External ear normal.  Left Ear: External ear normal.     Nose: Nose normal.     Mouth/Throat:     Pharynx: No oropharyngeal exudate.  Eyes:     Conjunctiva/sclera: Conjunctivae normal.     Pupils: Pupils are equal, round, and reactive to light.  Neck:     Thyroid: No thyromegaly.     Vascular: No JVD.  Cardiovascular:     Rate and Rhythm: Normal rate and regular rhythm.     Heart sounds: Murmur heard.     No friction rub. No gallop.  Pulmonary:     Effort: Pulmonary effort is normal. No respiratory distress.     Breath sounds: Normal breath sounds. No wheezing or rales.   Chest:     Chest wall: No tenderness.  Abdominal:     General: Bowel sounds are normal. There is no distension.     Palpations: Abdomen is soft. There is no mass.     Tenderness: There is no abdominal tenderness. There is no guarding or rebound.  Musculoskeletal:        General: No tenderness or deformity. Normal range of motion.     Cervical back: Neck supple.  Lymphadenopathy:     Cervical: No cervical adenopathy.  Skin:    General: Skin is warm.     Coloration: Skin is not pale.     Findings: No erythema or rash.  Neurological:     Mental Status: She is alert and oriented to person, place, and time.     Cranial Nerves: No cranial nerve deficit.     Motor: No abnormal muscle tone.     Coordination: Coordination normal.     Deep Tendon Reflexes: Reflexes are normal and symmetric.  Psychiatric:        Behavior: Behavior normal.        Thought Content: Thought content normal.        Judgment: Judgment normal.           Assessment & Plan:  Postmenopausal estrogen deficiency - Plan: DG Bone Density  General medical exam  Essential hypertension  Pure hypercholesterolemia Colonoscopy due next year.  Mammogram is up-to-date.  I will schedule the patient for a bone density test.  I recommended calcium 1200 mg a day and vitamin D 1000 units a day.  Lab work is outstanding.  Blood pressure is acceptable.  Patient received her flu shot today.  I recommended a COVID booster as well as the shingles vaccine. Lab on 06/07/2022  Component Date Value Ref Range Status   WBC 06/07/2022 4.9  3.8 - 10.8 Thousand/uL Final   RBC 06/07/2022 3.91  3.80 - 5.10 Million/uL Final   Hemoglobin 06/07/2022 12.3  11.7 - 15.5 g/dL Final   HCT 06/07/2022 36.0  35.0 - 45.0 % Final   MCV 06/07/2022 92.1  80.0 - 100.0 fL Final   MCH 06/07/2022 31.5  27.0 - 33.0 pg Final   MCHC 06/07/2022 34.2  32.0 - 36.0 g/dL Final   RDW 06/07/2022 13.9  11.0 - 15.0 % Final   Platelets 06/07/2022 227  140 - 400  Thousand/uL Final   MPV 06/07/2022 12.7 (H)  7.5 - 12.5 fL Final   Neutro Abs 06/07/2022 2,778  1,500 - 7,800 cells/uL Final   Lymphs Abs 06/07/2022 1,622  850 - 3,900 cells/uL Final   Absolute Monocytes 06/07/2022 372  200 - 950 cells/uL Final   Eosinophils Absolute 06/07/2022 78  15 - 500 cells/uL Final   Basophils Absolute 06/07/2022 49  0 - 200 cells/uL Final   Neutrophils Relative % 06/07/2022 56.7  % Final   Total Lymphocyte 06/07/2022 33.1  % Final   Monocytes Relative 06/07/2022 7.6  % Final   Eosinophils Relative 06/07/2022 1.6  % Final   Basophils Relative 06/07/2022 1.0  % Final   Glucose, Bld 06/07/2022 96  65 - 99 mg/dL Final   Comment: .            Fasting reference interval .    BUN 06/07/2022 14  7 - 25 mg/dL Final   Creat 06/07/2022 0.96  0.50 - 1.05 mg/dL Final   eGFR 06/07/2022 64  > OR = 60 mL/min/1.57m Final   BUN/Creatinine Ratio 06/07/2022 SEE NOTE:  6 - 22 (calc) Final   Comment:    Not Reported: BUN and Creatinine are within    reference range. .    Sodium 06/07/2022 139  135 - 146 mmol/L Final   Potassium 06/07/2022 3.5  3.5 - 5.3 mmol/L Final   Chloride 06/07/2022 102  98 - 110 mmol/L Final   CO2 06/07/2022 24  20 - 32 mmol/L Final   Calcium 06/07/2022 8.9  8.6 - 10.4 mg/dL Final   Total Protein 06/07/2022 6.8  6.1 - 8.1 g/dL Final   Albumin 06/07/2022 3.6  3.6 - 5.1 g/dL Final   Globulin 06/07/2022 3.2  1.9 - 3.7 g/dL (calc) Final   AG Ratio 06/07/2022 1.1  1.0 - 2.5 (calc) Final   Total Bilirubin 06/07/2022 0.3  0.2 - 1.2 mg/dL Final   Alkaline phosphatase (APISO) 06/07/2022 67  37 - 153 U/L Final   AST 06/07/2022 14  10 - 35 U/L Final   ALT 06/07/2022 10  6 - 29 U/L Final   Cholesterol 06/07/2022 161  <200 mg/dL Final   HDL 06/07/2022 53  > OR = 50 mg/dL Final   Triglycerides 06/07/2022 174 (H)  <150 mg/dL Final   LDL Cholesterol (Calc) 06/07/2022 81  mg/dL (calc) Final   Comment: Reference range: <100 . Desirable range <100 mg/dL for primary  prevention;   <70 mg/dL for patients with CHD or diabetic patients  with > or = 2 CHD risk factors. .Marland KitchenLDL-C is now calculated using the Martin-Hopkins  calculation, which is a validated novel method providing  better accuracy than the Friedewald equation in the  estimation of LDL-C.  MCresenciano Genreet al. JAnnamaria Helling 29643;838(18: 2061-2068  (http://education.[facility].com/faq/FAQ164)    Total CHOL/HDL Ratio 06/07/2022 3.0  <5.0 (calc) Final   Non-HDL Cholesterol (Calc) 06/07/2022 108  <130 mg/dL (calc) Final   Comment: For patients with diabetes plus 1 major ASCVD risk  factor, treating to a non-HDL-C goal of <100 mg/dL  (LDL-C of <70 mg/dL) is considered a therapeutic  option.    T3 Uptake 06/07/2022 22  22 - 35 % Final   T4, Total 06/07/2022 10.7  5.1 - 11.9 mcg/dL Final   Free Thyroxine Index 06/07/2022 2.4  1.4 - 3.8 Final   TSH 06/07/2022 3.59  0.40 - 4.50 mIU/L Final

## 2022-06-18 ENCOUNTER — Other Ambulatory Visit: Payer: Self-pay

## 2022-06-18 DIAGNOSIS — Z23 Encounter for immunization: Secondary | ICD-10-CM

## 2022-06-19 ENCOUNTER — Other Ambulatory Visit: Payer: Self-pay | Admitting: Obstetrics and Gynecology

## 2022-06-19 DIAGNOSIS — R928 Other abnormal and inconclusive findings on diagnostic imaging of breast: Secondary | ICD-10-CM

## 2022-07-05 ENCOUNTER — Other Ambulatory Visit: Payer: Self-pay | Admitting: Obstetrics and Gynecology

## 2022-07-05 ENCOUNTER — Ambulatory Visit
Admission: RE | Admit: 2022-07-05 | Discharge: 2022-07-05 | Disposition: A | Discharge status: Home / Self Care | Payer: 59 | Source: Ambulatory Visit | Attending: Obstetrics and Gynecology | Admitting: Obstetrics and Gynecology

## 2022-07-05 DIAGNOSIS — R928 Other abnormal and inconclusive findings on diagnostic imaging of breast: Secondary | ICD-10-CM

## 2022-07-05 DIAGNOSIS — N6310 Unspecified lump in the right breast, unspecified quadrant: Secondary | ICD-10-CM

## 2022-09-06 ENCOUNTER — Ambulatory Visit: Payer: 59 | Admitting: Family Medicine

## 2022-09-06 ENCOUNTER — Encounter: Payer: Self-pay | Admitting: Family Medicine

## 2022-09-06 VITALS — BP 132/82 | HR 85 | Ht 64.0 in | Wt 212.0 lb

## 2022-09-06 DIAGNOSIS — J329 Chronic sinusitis, unspecified: Secondary | ICD-10-CM

## 2022-09-06 DIAGNOSIS — Z1211 Encounter for screening for malignant neoplasm of colon: Secondary | ICD-10-CM

## 2022-09-06 MED ORDER — FLUCONAZOLE 150 MG PO TABS: 150.0000 mg | ORAL_TABLET | Freq: Once | ORAL | 0 refills | Status: AC

## 2022-09-06 MED ORDER — OMEPRAZOLE 20 MG PO CPDR: 20.0000 mg | DELAYED_RELEASE_CAPSULE | Freq: Every day | ORAL | 3 refills | Status: DC

## 2022-09-06 MED ORDER — AMOXICILLIN-POT CLAVULANATE 875-125 MG PO TABS: 1.0000 | ORAL_TABLET | Freq: Two times a day (BID) | ORAL | 0 refills | Status: DC

## 2022-09-06 MED ORDER — PREDNISONE 20 MG PO TABS: ORAL_TABLET | ORAL | 0 refills | Status: DC

## 2022-09-06 NOTE — Progress Notes (Signed)
Subjective:    Patient ID: Connie Cisneros, female    DOB: Sep 22, 1950, 72 y.o.   MRN: [id number]  HPI Patient has been battling a sinus infection for 4 weeks.  Symptoms include head congestion, postnasal drip, sinus pressure.  She has been taking over-the-counter medication for 4 weeks without any improvement.  She is having epistaxis on occasion.  She is also blowing purulent material out of both nasal passages. 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( [facility])   Psoriatic arthritis ([place])    Rheumatoid arthritis ([place])    Past Surgical History:  Procedure Laterality Date   ABDOMINAL HYSTERECTOMY N/A    [facility] 04/05/2020   SPINE SURGERY N/A    [facility] 04/05/2020   TOTAL VAGINAL HYSTERECTOMY     Current Outpatient Medications on File Prior to Visit  Medication Sig Dispense Refill   atorvastatin (LIPITOR) 40 MG tablet TAKE 1 TABLET(40 MG) BY MOUTH DAILY AT 6 PM 90 tablet 3   carvedilol (COREG) 6.25 MG tablet TAKE 1 TABLET(6.25 MG) BY MOUTH TWICE DAILY WITH A MEAL 180 tablet 3   cholecalciferol (VITAMIN D) 1000 UNITS tablet Take 1,000 Units by mouth daily.     diclofenac (VOLTAREN) 75 MG EC tablet Take 75 mg by mouth 2 (two) times daily.     estradiol (ESTRACE) 2 MG tablet Take 2 mg by mouth daily. (Patient not taking: Reported on 06/14/2022)     fluticasone (FLONASE) 50 MCG/ACT nasal spray Place 2 sprays into both  nostrils daily. 16 g 6   hydrochlorothiazide (MICROZIDE) 12.5 MG capsule TAKE 1 CAPSULE(12.5 MG) BY MOUTH DAILY 90 capsule 3   hydroxychloroquine (PLAQUENIL) 200 MG tablet Take 200 mg by mouth 2 (two) times daily.     levocetirizine (XYZAL) 5 MG tablet Take 1 tablet (5 mg total) by mouth every evening. 30 tablet 0   mometasone (ELOCON) 0.1 % cream Apply topically daily. 45 g 1   omeprazole (PRILOSEC) 40 MG capsule Take 40 mg by mouth daily.   0   RESTASIS 0.05 % ophthalmic emulsion INSTILL 1 DROP INTO EACH EYE BID.     vitamin B-12 (CYANOCOBALAMIN) 100 MCG tablet Take 100 mcg by mouth daily.     YUVAFEM 10 MCG TABS vaginal tablet Place 10 mcg vaginally as needed.     No current facility-administered medications on file prior to visit.   Allergies  Allergen Reactions   Losartan     Unclear if it was losartan or verapamil that caused angioedema, both are stopped   Verapamil     Stopped by urgent care in 03/22/2016 for possible angioedema, losartan was stopped on 03/25/16 by cardiology as it is a more likely culprit   Codeine Nausea Only   Lisinopril Other (See Comments)    Sleepy and tired   Sulfa Antibiotics Nausea Only  Wellbutrin [Bupropion] Other (See Comments)    Reaction to losartan and verapamil   Social History   Socioeconomic History   Marital status: Married    Spouse name: Not on file   Number of children: Not on file   Years of education: Not on file   Highest education level: Not on file  Occupational History   Not on file  Tobacco Use   Smoking status: Never   Smokeless tobacco: Never  Vaping Use   Vaping Use: Never used  Substance and Sexual Activity   Alcohol use: No   Drug use: Never   Sexual activity: Not on file  Other Topics Concern   Not on file  Social History Narrative   Not on file   Social Determinants of Health   Financial Resource Strain: Not on file  Food Insecurity: Not on file  Transportation Needs: Not on file  Physical Activity: Not  on file  Stress: Not on file  Social Connections: Not on file  Intimate Partner Violence: Not on file     Review of Systems  All other systems reviewed and are negative.      Objective:   Physical Exam Vitals reviewed.  Constitutional:      General: She is not in acute distress.    Appearance: Normal appearance. She is well-developed. She is not ill-appearing, toxic-appearing or diaphoretic.  HENT:     Right Ear: Tympanic membrane, ear canal and external ear normal.     Left Ear: Tympanic membrane, ear canal and external ear normal.     Nose: Mucosal edema, congestion and rhinorrhea present.     Right Sinus: No maxillary sinus tenderness or frontal sinus tenderness.     Left Sinus: No maxillary sinus tenderness or frontal sinus tenderness.     Mouth/Throat:     Pharynx: Oropharynx is clear. No oropharyngeal exudate or posterior oropharyngeal erythema.  Eyes:     General:        Right eye: No discharge.        Left eye: No discharge.     Conjunctiva/sclera: Conjunctivae normal.  Cardiovascular:     Rate and Rhythm: Normal rate and regular rhythm.     Heart sounds: Normal heart sounds.  Pulmonary:     Effort: Pulmonary effort is normal. No respiratory distress.     Breath sounds: Normal breath sounds. No wheezing or rales.  Chest:     Chest wall: No tenderness.  Musculoskeletal:     Cervical back: Neck supple.  Lymphadenopathy:     Cervical: No cervical adenopathy.  Neurological:     Mental Status: She is alert.           Assessment & Plan:  Colon cancer screening - Plan: Ambulatory referral to Gastroenterology  Rhinosinusitis I believe she has a sinus infection.  Begin Augmentin 875 mg twice daily for 10 days and use prednisone taper pack.  Continue Flonase and Xyzal as the patient is already on meds for prophylaxis.  Recommended nasal saline rinses.  Consult GI as she is due for colonoscopy for colon cancer screening.

## 2022-09-25 ENCOUNTER — Other Ambulatory Visit: Payer: Self-pay | Admitting: Cardiovascular Disease

## 2022-09-25 ENCOUNTER — Other Ambulatory Visit: Payer: Self-pay | Admitting: Family Medicine

## 2022-10-01 ENCOUNTER — Other Ambulatory Visit: Payer: Self-pay | Admitting: Cardiovascular Disease

## 2022-10-23 NOTE — Progress Notes (Signed)
Cardiology Clinic Note   Date: 10/25/2022 ID: Connie Cisneros, Connie Cisneros 1951-06-04, MRN [id number]  Primary Cardiologist:  Shelva Majestic, MD  Patient Profile    Connie Cisneros is a 72 y.o. female who presents to the clinic today for delayed 1 year follow-up.   Past medical history significant for: Cardiac murmur. Echo 02/04/2020: EF 60 to 65%.  Mild LVH.  Mild aortic valve sclerosis without stenosis. Chronic palpitations. Hypertension. Hyperlipidemia. Lipid panel 06/07/2022: LDL 81, HDL 53, TG 174, total 161.  Mild carotid artery plaque. Carotid ultrasound 07/12/2013: Normal right carotid.  Left <20% proximal ICA.  No evidence of hemodynamically significant carotid artery stenosis. Right lower extremity DVT. Migraine headaches. Rheumatoid/psoriatic arthritis.   History of Present Illness    Connie Cisneros was first evaluated by Dr. Claiborne Billings on 07/20/2013 for labile BP at the request of Dr. Modena Morrow.  Echo showed normal LV function, mildly thickened mitral valve leaflets, structurally normal aortic valve.  She was seen by Cindra Presume, PA-C via a telehealth visit on 01/11/2020.  At that time she complained of exertional dyspnea and fatigue.  Coronary CTA showed a calcium score of 0 with normal coronary arteries.  She was last seen in the office by Dr. Claiborne Billings on 11/10/2020.  At that time she was doing well.  She continued to suffer from migraine headaches for which she had previously taken verapamil but due to throat/lip swelling had to discontinue.  She had no cardiac complaints and was continued on medications without changes.  Today, patient is doing well overall.  She has not had migraine in quite some time.  She complains of decreased energy which she attributes to her rheumatoid and psoriatic arthritis.  She reports for the last couple of years she experiences occasional epigastric chest pain described as "feeling like air gets trapped."  This sensation will last approximately an hour.  Lying down  makes pain worse and walking around helps.  She will sometimes drink a carbonated beverage and belch with relief of pain.  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  She does not follow an exercise program.  She has chronic ankle edema secondary to her arthritis but no other edema.  No orthopnea or PND.  She does complain of dyspnea on exertion that seems a little worse than previously.  She is unsure how long this has been going on.  She is able to do grocery shopping and things around her home without much shortness of breath but notices dyspnea occurs with shorter distances than previously. Denies claudication.    ROS: All other systems reviewed and are otherwise negative except as noted in History of Present Illness.  Studies Reviewed    ECG personally reviewed by me today: NSR, 77 bpm.  No significant changes from 11/10/2020.   Physical Exam    VS:  BP 122/70   Pulse 77   Ht 5\' 4"  (1.626 m)   Wt 207 lb 12.8 oz (94.3 kg)   BMI 35.67 kg/m  , BMI Body mass index is 35.67 kg/m.  GEN: Well nourished, well developed, in no acute distress. Neck: No JVD or carotid bruits. Cardiac: RRR. 2/6 systolic murmur. No rubs or gallops.   Respiratory:  Respirations regular and unlabored. Clear to auscultation without rales, wheezing or rhonchi. GI: Soft, nontender, nondistended. Extremities: Radials/DP/PT 2+ and equal bilaterally. No clubbing or cyanosis. No edema.  Skin: Warm and dry, no rash. Neuro: Strength intact.  Assessment & Plan   DOE.  Patient complains of dyspnea on exertion that seems worse over an unknown period of time.  She states she can do grocery shopping and normal activities around her home with very little shortness of breath but has noticed dyspnea with shorter distances than previous.  Will get an echo to further evaluate. Cardiac murmur.  Echo July 2021 showed EF 60 to 65%, mild LVH, mild aortic valve sclerosis without stenosis.  Patient reports  increased DOE (see #1). Epigastric pain.  Patient describes occasional episodes of chest pain and epigastric that feels like "trapped air" occurring over the last couple of years.  Pain is worsened by lying down and improved with walking around.  She will occasionally drink a carbonated drink and belch to relieve the pain.  Episodes last approximately an hour.  This does not sound cardiac in nature.  Will defer ischemic evaluation for now.  May pursue a repeat coronary CTA depending on the results of echo. Hypertension.  BP today 122/70.  Patient denies headaches or dizziness.  Continue carvedilol and hydrochlorothiazide. Hyperlipidemia.  LDL November 2023 81, at goal.  Continue atorvastatin.  Disposition: Echo.  Follow-up in 1 year or sooner as needed.         Signed, Justice Britain. Aavya Shafer, DNP, NP-C

## 2022-10-25 ENCOUNTER — Ambulatory Visit: Payer: 59 | Attending: Student | Admitting: Student

## 2022-10-25 ENCOUNTER — Encounter: Payer: Self-pay | Admitting: Student

## 2022-10-25 VITALS — BP 122/70 | HR 77 | Ht 64.0 in | Wt 207.8 lb

## 2022-10-25 DIAGNOSIS — R011 Cardiac murmur, unspecified: Secondary | ICD-10-CM | POA: Diagnosis not present

## 2022-10-25 DIAGNOSIS — E782 Mixed hyperlipidemia: Secondary | ICD-10-CM | POA: Diagnosis not present

## 2022-10-25 DIAGNOSIS — R0609 Other forms of dyspnea: Secondary | ICD-10-CM | POA: Diagnosis not present

## 2022-10-25 DIAGNOSIS — R1013 Epigastric pain: Secondary | ICD-10-CM | POA: Diagnosis not present

## 2022-10-25 DIAGNOSIS — I10 Essential (primary) hypertension: Secondary | ICD-10-CM

## 2022-10-25 MED ORDER — ATORVASTATIN CALCIUM 40 MG PO TABS: ORAL_TABLET | ORAL | 3 refills | Status: DC

## 2022-10-25 MED ORDER — CARVEDILOL 6.25 MG PO TABS: ORAL_TABLET | ORAL | 3 refills | Status: DC

## 2022-10-25 MED ORDER — HYDROCHLOROTHIAZIDE 12.5 MG PO CAPS: ORAL_CAPSULE | ORAL | 3 refills | Status: DC

## 2022-10-25 NOTE — Patient Instructions (Signed)
Medication Instructions:  Your physician recommends that you continue on your current medications as directed. Please refer to the Current Medication list given to you today.  *If you need a refill on your cardiac medications before your next appointment, please call your pharmacy*   Lab Work: NONE If you have labs (blood work) drawn today and your tests are completely normal, you will receive your results only by: [place] (if you have MyChart) OR A paper copy in the mail If you have any lab test that is abnormal or we need to change your treatment, we will call you to review the results.   Testing/Procedures: Your physician has requested that you have an echocardiogram. Echocardiography is a painless test that uses sound waves to create images of your heart. It provides your doctor with information about the size and shape of your heart and how well your heart's chambers and valves are working. This procedure takes approximately one hour. There are no restrictions for this procedure. Please do NOT wear cologne, perfume, aftershave, or lotions (deodorant is allowed). Please arrive 15 minutes prior to your appointment time.    Follow-Up: At [facility], you and your health needs are our priority.  As part of our continuing mission to provide you with exceptional heart care, we have created designated Provider Care Teams.  These Care Teams include your primary Cardiologist (physician) and Advanced Practice Providers (APPs -  Physician Assistants and Nurse Practitioners) who all work together to provide you with the care you need, when you need it.  We recommend signing up for the patient portal called "MyChart".  Sign up information is provided on this After Visit Summary.  MyChart is used to connect with patients for Virtual Visits (Telemedicine).  Patients are able to view lab/test results, encounter notes, upcoming appointments, etc.  Non-urgent messages can be sent to your  provider as well.   To learn more about what you can do with MyChart, go to [email or web address].    Your next appointment:   1 year(s)  Provider:   Shelva Majestic, MD

## 2022-11-29 ENCOUNTER — Ambulatory Visit (HOSPITAL_COMMUNITY): Payer: 59 | Attending: Cardiology

## 2022-11-29 DIAGNOSIS — R0609 Other forms of dyspnea: Secondary | ICD-10-CM

## 2022-11-29 LAB — ECHOCARDIOGRAM COMPLETE
Area-P 1/2: 2.56 cm2
Calc EF: 69.2 %
S' Lateral: 2.2 cm
Single Plane A2C EF: 66.5 %
Single Plane A4C EF: 68.5 %

## 2022-12-01 ENCOUNTER — Other Ambulatory Visit: Payer: Self-pay | Admitting: Cardiovascular Disease

## 2022-12-06 ENCOUNTER — Ambulatory Visit
Admission: RE | Admit: 2022-12-06 | Discharge: 2022-12-06 | Disposition: A | Discharge status: Home / Self Care | Payer: 59 | Source: Ambulatory Visit | Attending: Family Medicine | Admitting: Family Medicine

## 2022-12-06 DIAGNOSIS — Z780 Asymptomatic menopausal state: Secondary | ICD-10-CM

## 2023-01-10 ENCOUNTER — Other Ambulatory Visit: Payer: Self-pay | Admitting: Obstetrics and Gynecology

## 2023-01-10 ENCOUNTER — Ambulatory Visit
Admission: RE | Admit: 2023-01-10 | Discharge: 2023-01-10 | Disposition: A | Discharge status: Home / Self Care | Payer: 59 | Source: Ambulatory Visit | Attending: Obstetrics and Gynecology | Admitting: Obstetrics and Gynecology

## 2023-01-10 DIAGNOSIS — N6310 Unspecified lump in the right breast, unspecified quadrant: Secondary | ICD-10-CM

## 2023-01-28 ENCOUNTER — Other Ambulatory Visit: Payer: Self-pay | Admitting: Gastroenterology

## 2023-01-28 DIAGNOSIS — R1013 Epigastric pain: Secondary | ICD-10-CM

## 2023-01-28 DIAGNOSIS — R112 Nausea with vomiting, unspecified: Secondary | ICD-10-CM

## 2023-02-04 ENCOUNTER — Other Ambulatory Visit: Payer: 59

## 2023-02-10 ENCOUNTER — Ambulatory Visit
Admission: RE | Admit: 2023-02-10 | Discharge: 2023-02-10 | Disposition: A | Discharge status: Home / Self Care | Payer: 59 | Source: Ambulatory Visit | Attending: Gastroenterology | Admitting: Gastroenterology

## 2023-02-10 DIAGNOSIS — R1013 Epigastric pain: Secondary | ICD-10-CM

## 2023-02-10 DIAGNOSIS — R112 Nausea with vomiting, unspecified: Secondary | ICD-10-CM

## 2023-03-17 ENCOUNTER — Telehealth: Payer: Self-pay | Admitting: *Deleted

## 2023-03-17 NOTE — Telephone Encounter (Signed)
   Pre-operative Risk Assessment    Patient Name: Connie Cisneros  DOB: [birth year] MRN: [id number]      Request for Surgical Clearance    Procedure:   CHOLECYSTECTOMY   Date of Surgery:  Clearance TBD                                 Surgeon:  DR. Cristal Deer WHITE Surgeon's Group or Practice Name:  [facility] Phone number:  [phone] Fax number:  [phone] ATTN: Doristine Devoid, CMA   Type of Clearance Requested:   - Medical ; NO MEDICATIONS LISTED AS NEEDING TO BE HELD   Type of Anesthesia:  General    Additional requests/questions:    Elpidio Anis   03/17/2023, 5:14 PM

## 2023-03-18 ENCOUNTER — Telehealth: Payer: Self-pay | Admitting: *Deleted

## 2023-03-18 NOTE — Telephone Encounter (Signed)
  Patient Consent for Virtual Visit        Connie Cisneros has provided verbal consent on 03/18/2023 for a virtual visit (video or telephone).   CONSENT FOR VIRTUAL VISIT FOR:  Connie Cisneros  By participating in this virtual visit I agree to the following:  I hereby voluntarily request, consent and authorize [facility] and its employed or contracted physicians, physician assistants, nurse practitioners or other licensed health care professionals (the Practitioner), to provide me with telemedicine health care services (the "Services") as deemed necessary by the treating Practitioner. I acknowledge and consent to receive the Services by the Practitioner via telemedicine. I understand that the telemedicine visit will involve communicating with the Practitioner through live audiovisual communication technology and the disclosure of certain medical information by electronic transmission. I acknowledge that I have been given the opportunity to request an in-person assessment or other available alternative prior to the telemedicine visit and am voluntarily participating in the telemedicine visit.  I understand that I have the right to withhold or withdraw my consent to the use of telemedicine in the course of my care at any time, without affecting my right to future care or treatment, and that the Practitioner or I may terminate the telemedicine visit at any time. I understand that I have the right to inspect all information obtained and/or recorded in the course of the telemedicine visit and may receive copies of available information for a reasonable fee.  I understand that some of the potential risks of receiving the Services via telemedicine include:  Delay or interruption in medical evaluation due to technological equipment failure or disruption; Information transmitted may not be sufficient (e.g. poor resolution of images) to allow for appropriate medical decision making by the  Practitioner; and/or  In rare instances, security protocols could fail, causing a breach of personal health information.  Furthermore, I acknowledge that it is my responsibility to provide information about my medical history, conditions and care that is complete and accurate to the best of my ability. I acknowledge that Practitioner's advice, recommendations, and/or decision may be based on factors not within their control, such as incomplete or inaccurate data provided by me or distortions of diagnostic images or specimens that may result from electronic transmissions. I understand that the practice of medicine is not an exact science and that Practitioner makes no warranties or guarantees regarding treatment outcomes. I acknowledge that a copy of this consent can be made available to me via my patient portal [facility] MyChart), or I can request a printed copy by calling the office of [facility].    I understand that my insurance will be billed for this visit.   I have read or had this consent read to me. I understand the contents of this consent, which adequately explains the benefits and risks of the Services being provided via telemedicine.  I have been provided ample opportunity to ask questions regarding this consent and the Services and have had my questions answered to my satisfaction. I give my informed consent for the services to be provided through the use of telemedicine in my medical care

## 2023-03-18 NOTE — Telephone Encounter (Signed)
Spoke with patient and scheduled her for a preop telehealth visit on 03/26/23 at 2:20 PM. Will route to requesting surgeons office to make them aware.

## 2023-03-18 NOTE — Telephone Encounter (Signed)
   Name: Connie Cisneros  DOB: 16-Dec-1950  MRN: [id number]  Primary Cardiologist: Nicki Guadalajara, MD  Chart reviewed as part of pre-operative protocol coverage. Because of Connie Cisneros's past medical history and time since last visit, she will require a follow-up telephone visit in order to better assess preoperative cardiovascular risk.  Pre-op covering staff: - Please schedule appointment and call patient to inform them. If patient already had an upcoming appointment within acceptable timeframe, please add "pre-op clearance" to the appointment notes so provider is aware. - Please contact requesting surgeon's office via preferred method (i.e, phone, fax) to inform them of need for appointment prior to surgery.  No medications need to be held.   Connie Dory, PA-C  03/18/2023, 8:14 AM

## 2023-03-20 LAB — HM COLONOSCOPY

## 2023-03-26 ENCOUNTER — Ambulatory Visit: Payer: 59 | Attending: Internal Medicine

## 2023-03-26 DIAGNOSIS — Z01810 Encounter for preprocedural cardiovascular examination: Secondary | ICD-10-CM

## 2023-03-26 NOTE — Progress Notes (Signed)
Virtual Visit via Telephone Note   Because of Connie Cisneros's co-morbid illnesses, she is at least at moderate risk for complications without adequate follow up.  This format is felt to be most appropriate for this patient at this time.  The patient did not have access to video technology/had technical difficulties with video requiring transitioning to audio format only (telephone).  All issues noted in this document were discussed and addressed.  No physical exam could be performed with this format.  Please refer to the patient's chart for her consent to telehealth for [facility].  Evaluation Performed:  Preoperative cardiovascular risk assessment _____________   Date:  03/26/2023   Patient ID:  Connie Cisneros, DOB 1950-09-06, MRN [id number] Patient Location:  Home Provider location:   Office  Primary Care Provider:  Donita Brooks, MD Primary Cardiologist:  Nicki Guadalajara, MD  Chief Complaint / Patient Profile   72 y.o. y/o female with a h/o HTN, HLD, right lower extremity DVT, palpitations, who is pending cholecystectomy and presents today for telephonic preoperative cardiovascular risk assessment.  History of Present Illness    Connie Cisneros is a 72 y.o. female who presents via audio/video conferencing for a telehealth visit today.  Pt was last seen in cardiology clinic on 10/25/2022 by Carlos Levering, NP.  At that time Connie Cisneros was doing well no new cardiac complaints did report decreased energy and episodes of shortness of breath. 2D echo was completed showing no acute changes or significant abnormalities. The patient is now pending procedure as outlined above. Since her last visit, she has been doing well with no new cardiac complaints.  She is currently weaning off of one of her hormonal replacement medications and feels that it was contributing to her shortness of breath.  She denies chest pain, shortness of breath, lower extremity edema, fatigue,  palpitations, melena, hematuria, hemoptysis, diaphoresis, weakness, presyncope, syncope, orthopnea, and PND.     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( [facility])   Psoriatic arthritis (HCC)    Rheumatoid arthritis (HCC)    Past Surgical History:  Procedure Laterality Date   ABDOMINAL HYSTERECTOMY N/A    Phreesia 04/05/2020   BREAST EXCISIONAL BIOPSY Left    SPINE SURGERY N/A    Phreesia 04/05/2020   TOTAL VAGINAL HYSTERECTOMY      Allergies  Allergies  Allergen Reactions   Losartan     Unclear if it was losartan or verapamil that caused angioedema, both are stopped   Verapamil     Stopped by urgent care in 03/22/2016 for possible angioedema, losartan was stopped on 03/25/16 by cardiology as it is a more likely culprit   Codeine Nausea Only   Lisinopril Other (See Comments)    Sleepy and tired   Sulfa Antibiotics  Nausea Only   Wellbutrin [Bupropion] Other (See Comments)    Reaction to losartan and verapamil    Home Medications    Prior to Admission medications   Medication Sig Start Date End Date Taking? Authorizing Provider  amoxicillin-clavulanate (AUGMENTIN) 875-125 MG tablet Take 1 tablet by mouth 2 (two) times daily. 09/06/22   Donita Brooks, MD  atorvastatin (LIPITOR) 40 MG tablet TAKE 1 TABLET(40 MG) BY MOUTH DAILY AT 6 PM 10/25/22   Carlos Levering, NP  buPROPion  (WELLBUTRIN XL) 150 MG 24 hr tablet Take 150 mg by mouth daily. 08/10/22   [provider]  carvedilol (COREG) 6.25 MG tablet TAKE 1 TABLET(6.25 MG) BY MOUTH TWICE DAILY WITH A MEAL 12/02/22   Lennette Bihari, MD  cholecalciferol (VITAMIN D) 1000 UNITS tablet Take 1,000 Units by mouth daily.    [provider]  diclofenac (VOLTAREN) 75 MG EC tablet Take 75 mg by mouth 2 (two) times daily. 06/04/19   [provider]  estradiol (ESTRACE) 1 MG tablet Take 1 mg by mouth daily. 08/19/22   [provider]  fluticasone (FLONASE) 50 MCG/ACT nasal spray Place 2 sprays into both nostrils daily. 11/03/20   Donita Brooks, MD  hydrochlorothiazide (MICROZIDE) 12.5 MG capsule TAKE 1 CAPSULE(12.5 MG) BY MOUTH DAILY 10/25/22   Carlos Levering, NP  hydroxychloroquine (PLAQUENIL) 200 MG tablet Take 200 mg by mouth 2 (two) times daily. 06/04/19   [provider]  levocetirizine (XYZAL) 5 MG tablet Take 1 tablet (5 mg total) by mouth every evening. 03/03/18   Donita Brooks, MD  mometasone (ELOCON) 0.1 % cream Apply topically daily. 05/10/22   Donita Brooks, MD  omeprazole (PRILOSEC) 20 MG capsule Take 1 capsule (20 mg total) by mouth daily. 09/06/22   Donita Brooks, MD  omeprazole (PRILOSEC) 40 MG capsule Take 40 mg by mouth daily.  02/07/15   [provider]  predniSONE (DELTASONE) 20 MG tablet 3 tabs poqday 1-2, 2 tabs poqday 3-4, 1 tab poqday 5-6 09/06/22   Donita Brooks, MD  RESTASIS 0.05 % ophthalmic emulsion INSTILL 1 DROP INTO EACH EYE BID. 01/18/19   [provider]  vitamin B-12 (CYANOCOBALAMIN) 100 MCG tablet Take 100 mcg by mouth daily.    [provider]    Physical Exam    Vital Signs:  Connie Cisneros does not have vital signs available for review today.  Given telephonic nature of communication, physical exam is limited. AAOx3. NAD. Normal affect.  Speech and respirations are unlabored.  Accessory Clinical Findings     None  Assessment & Plan    1.  Preoperative Cardiovascular Risk Assessment: -Patient's RCRI score is 0.9%  The patient affirms she has been doing well without any new cardiac symptoms. They are able to achieve 5 METS without cardiac limitations. Therefore, based on ACC/AHA guidelines, the patient would be at acceptable risk for the planned procedure without further cardiovascular testing. The patient was advised that if she develops new symptoms prior to surgery to contact our office to arrange for a follow-up visit, and she verbalized understanding.   The patient was advised that if she develops new symptoms prior to surgery to contact our office to arrange for a follow-up visit, and she verbalized understanding.   A copy of this note will be routed to requesting surgeon.  Time:   Today, I have spent 6 minutes with the patient with telehealth technology discussing medical history, symptoms, and management plan.  Napoleon Form, Leodis Rains, NP  03/26/2023, 7:46 AM

## 2023-04-01 ENCOUNTER — Ambulatory Visit: Payer: Self-pay | Admitting: Surgery

## 2023-04-01 NOTE — Patient Instructions (Addendum)
SURGICAL WAITING ROOM VISITATION Patients having surgery or a procedure may have no more than 2 support people in the waiting area - these visitors may rotate.    Children under the age of 22 must have an adult with them who is not the patient.  If the patient needs to stay at the hospital during part of their recovery, the visitor guidelines for inpatient rooms apply. Pre-op nurse will coordinate an appropriate time for 1 support person to accompany patient in pre-op.  This support person may not rotate.    Please refer to the [facility] website for the visitor guidelines for Inpatients (after your surgery is over and you are in a regular room).       Your procedure is scheduled on: 04-09-23   Report to [facility] Main Entrance    Report to admitting at 8:15 AM   Call this number if you have problems the morning of surgery [phone]   Do not eat food or drink liquids :After Midnight.           If you have questions, please contact your surgeon's office.   FOLLOW ANY ADDITIONAL PRE OP INSTRUCTIONS YOU RECEIVED FROM YOUR SURGEON'S OFFICE!!!     Oral Hygiene is also important to reduce your risk of infection.                                    Remember - BRUSH YOUR TEETH THE MORNING OF SURGERY WITH YOUR REGULAR TOOTHPASTE   Do NOT smoke after Midnight   Take these medicines the morning of surgery with A SIP OF WATER:   Bupropion  Carvedilol  Omeprazole  Zyrtec  Okay to use nasal spray and eyedrops  Stop all vitamins and herbal supplements 7 days before surgery                              You may not have any metal on your body including hair pins, jewelry, and body piercing             Do not wear make-up, lotions, powders, perfumes or deodorant  Do not wear nail polish including gel and S&S, artificial/acrylic nails, or any other type of covering on natural nails including finger and toenails. If you have artificial nails, gel coating, etc. that needs to be  removed by a nail salon please have this removed prior to surgery or surgery may need to be canceled/ delayed if the surgeon/ anesthesia feels like they are unable to be safely monitored.   Do not shave  48 hours prior to surgery.    Do not bring valuables to the hospital. [facility] IS NOT RESPONSIBLE   FOR VALUABLES.   Contacts, dentures or bridgework may not be worn into surgery.  DO NOT BRING YOUR HOME MEDICATIONS TO THE HOSPITAL. PHARMACY WILL DISPENSE MEDICATIONS LISTED ON YOUR MEDICATION LIST TO YOU DURING YOUR ADMISSION IN THE HOSPITAL!    Patients discharged on the day of surgery will not be allowed to drive home.  Someone NEEDS to stay with you for the first 24 hours after anesthesia.               Please read over the following fact sheets you were given: IF YOU HAVE QUESTIONS ABOUT YOUR PRE-OP INSTRUCTIONS PLEASE CALL [phone] G[place]  If you received a COVID test during  your pre-op visit  it is requested that you wear a mask when out in public, stay away from anyone that may not be feeling well and notify your surgeon if you develop symptoms. If you test positive for Covid or have been in contact with anyone that has tested positive in the last 10 days please notify you surgeon.  [facility] - Preparing for Surgery Before surgery, you can play an important role.  Because skin is not sterile, your skin needs to be as free of germs as possible.  You can reduce the number of germs on your skin by washing with CHG (chlorahexidine gluconate) soap before surgery.  CHG is an antiseptic cleaner which kills germs and bonds with the skin to continue killing germs even after washing. Please DO NOT use if you have an allergy to CHG or antibacterial soaps.  If your skin becomes reddened/irritated stop using the CHG and inform your nurse when you arrive at Short Stay. Do not shave (including legs and underarms) for at least 48 hours prior to the first CHG shower.  You may shave your  face/neck.  Please follow these instructions carefully:  1.  Shower with CHG Soap the night before surgery and the  morning of surgery.  2.  If you choose to wash your hair, wash your hair first as usual with your normal  shampoo.  3.  After you shampoo, rinse your hair and body thoroughly to remove the shampoo.                             4.  Use CHG as you would any other liquid soap.  You can apply chg directly to the skin and wash.  Gently with a scrungie or clean washcloth.  5.  Apply the CHG Soap to your body ONLY FROM THE NECK DOWN.   Do   not use on face/ open                           Wound or open sores. Avoid contact with eyes, ears mouth and   genitals (private parts).                       Wash face,  Genitals (private parts) with your normal soap.             6.  Wash thoroughly, paying special attention to the area where your    surgery  will be performed.  7.  Thoroughly rinse your body with warm water from the neck down.  8.  DO NOT shower/wash with your normal soap after using and rinsing off the CHG Soap.                9.  Pat yourself dry with a clean towel.            10.  Wear clean pajamas.            11.  Place clean sheets on your bed the night of your first shower and do not  sleep with pets. Day of Surgery : Do not apply any lotions/deodorants the morning of surgery.  Please wear clean clothes to the hospital/surgery center.  FAILURE TO FOLLOW THESE INSTRUCTIONS MAY RESULT IN THE CANCELLATION OF YOUR SURGERY  PATIENT SIGNATURE_________________________________  NURSE SIGNATURE__________________________________  ________________________________________________________________________

## 2023-04-01 NOTE — Progress Notes (Signed)
Surgery orders requested via Epic inbox. °

## 2023-04-01 NOTE — Progress Notes (Addendum)
COVID Vaccine Completed:  Yes  Date of COVID positive in last 90 days:  No  PCP - Lynnea Ferrier, MD Cardiologist - Nicki Guadalajara, MD  Cardiac clearance in Epic dated 03-26-23 by Robin Searing, NP  Chest x-ray - N/A EKG - 10-25-22 Epic Stress Test - yes ECHO - 11-29-22 Epic Cardiac Cath - N/A Pacemaker/ICD device last checked: Spinal Cord Stimulator:N/A Coronary CT - 02-04-20 Epic  Bowel Prep - N/A  Sleep Study - Yes, neg sleep apnea CPAP - No  Fasting Blood Sugar - N/A Checks Blood Sugar _____ times a day  Last dose of GLP1 agonist-  N/A GLP1 instructions:  N/A   Last dose of SGLT-2 inhibitors-  N/A SGLT-2 instructions: N/A  Blood Thinner Instructions: N/A Aspirin Instructions: Last Dose:  Activity level:  Can go up a flight of stairs and perform activities of daily living without stopping and without symptoms of chest pain or shortness of breath.  Anesthesia review:  Murmur, palpitations, DOE, mild carotid artery plaque, HTN.  Potassium 2.8 on PAT labs  Patient states that she can get winded with exertion at times but not where she has to stop and rest.  States that this has been evaluated by cardiology.   Systolic BP elevated at PAT but patient states that this happens when she is at the doctor.  Monitors at home and it runs in the 130s/80s.   Patient denies shortness of breath, fever, cough and chest pain at PAT appointment  Patient verbalized understanding of instructions that were given to them at the PAT appointment. Patient was also instructed that they will need to review over the PAT instructions again at home before surgery.

## 2023-04-04 ENCOUNTER — Encounter (HOSPITAL_COMMUNITY)
Admission: RE | Admit: 2023-04-04 | Discharge: 2023-04-04 | Disposition: A | Discharge status: Home / Self Care | Payer: 59 | Source: Ambulatory Visit | Attending: Surgery | Admitting: Surgery

## 2023-04-04 ENCOUNTER — Encounter (HOSPITAL_COMMUNITY): Payer: Self-pay

## 2023-04-04 ENCOUNTER — Other Ambulatory Visit: Payer: Self-pay

## 2023-04-04 VITALS — BP 158/82 | HR 82 | Temp 98.4°F | Resp 14 | Ht 63.5 in | Wt 196.6 lb

## 2023-04-04 DIAGNOSIS — I10 Essential (primary) hypertension: Secondary | ICD-10-CM | POA: Insufficient documentation

## 2023-04-04 DIAGNOSIS — Z01818 Encounter for other preprocedural examination: Secondary | ICD-10-CM | POA: Diagnosis present

## 2023-04-04 DIAGNOSIS — Z01812 Encounter for preprocedural laboratory examination: Secondary | ICD-10-CM | POA: Insufficient documentation

## 2023-04-04 HISTORY — DX: Nausea with vomiting, unspecified: Z98.890

## 2023-04-04 HISTORY — DX: Nausea with vomiting, unspecified: R11.2

## 2023-04-04 LAB — BASIC METABOLIC PANEL
Anion gap: 12 (ref 5–15)
BUN: 15 mg/dL (ref 8–23)
CO2: 27 mmol/L (ref 22–32)
Calcium: 9.5 mg/dL (ref 8.9–10.3)
Chloride: 98 mmol/L (ref 98–111)
Creatinine, Ser: 1.12 mg/dL — ABNORMAL HIGH (ref 0.44–1.00)
GFR, Estimated: 53 mL/min — ABNORMAL LOW (ref >60)
Glucose, Bld: 104 mg/dL — ABNORMAL HIGH (ref 70–99)
Potassium: 2.8 mmol/L — ABNORMAL LOW (ref 3.5–5.1)
Sodium: 137 mmol/L (ref 135–145)

## 2023-04-04 LAB — CBC
HCT: 36.5 % (ref 36.0–46.0)
Hemoglobin: 11.9 g/dL — ABNORMAL LOW (ref 12.0–15.0)
MCH: 29.8 pg (ref 26.0–34.0)
MCHC: 32.6 g/dL (ref 30.0–36.0)
MCV: 91.5 fL (ref 80.0–100.0)
Platelets: 222 K/uL (ref 150–400)
RBC: 3.99 MIL/uL (ref 3.87–5.11)
RDW: 13.8 % (ref 11.5–15.5)
WBC: 4.4 K/uL (ref 4.0–10.5)
nRBC: 0 % (ref 0.0–0.2)

## 2023-04-09 ENCOUNTER — Ambulatory Visit (HOSPITAL_COMMUNITY): Payer: 59 | Admitting: Physician Assistant

## 2023-04-09 ENCOUNTER — Other Ambulatory Visit: Payer: Self-pay

## 2023-04-09 ENCOUNTER — Encounter (HOSPITAL_COMMUNITY): Payer: Self-pay | Admitting: Surgery

## 2023-04-09 ENCOUNTER — Encounter (HOSPITAL_COMMUNITY)
Admission: RE | Disposition: A | Discharge status: Home / Self Care | Payer: Self-pay | Source: Home / Self Care | Attending: Surgery

## 2023-04-09 ENCOUNTER — Ambulatory Visit (HOSPITAL_COMMUNITY)
Admission: RE | Admit: 2023-04-09 | Discharge: 2023-04-09 | Disposition: A | Discharge status: Home / Self Care | Payer: 59 | Attending: Surgery | Admitting: Surgery

## 2023-04-09 ENCOUNTER — Ambulatory Visit (HOSPITAL_BASED_OUTPATIENT_CLINIC_OR_DEPARTMENT_OTHER): Payer: 59 | Admitting: Anesthesiology

## 2023-04-09 DIAGNOSIS — Z79899 Other long term (current) drug therapy: Secondary | ICD-10-CM | POA: Insufficient documentation

## 2023-04-09 DIAGNOSIS — K8020 Calculus of gallbladder without cholecystitis without obstruction: Secondary | ICD-10-CM | POA: Diagnosis present

## 2023-04-09 DIAGNOSIS — M75122 Complete rotator cuff tear or rupture of left shoulder, not specified as traumatic: Secondary | ICD-10-CM

## 2023-04-09 DIAGNOSIS — K8010 Calculus of gallbladder with chronic cholecystitis without obstruction: Secondary | ICD-10-CM | POA: Diagnosis not present

## 2023-04-09 DIAGNOSIS — Z86718 Personal history of other venous thrombosis and embolism: Secondary | ICD-10-CM | POA: Insufficient documentation

## 2023-04-09 DIAGNOSIS — I10 Essential (primary) hypertension: Secondary | ICD-10-CM | POA: Diagnosis not present

## 2023-04-09 HISTORY — PX: CHOLECYSTECTOMY: SHX55

## 2023-04-09 SURGERY — LAPAROSCOPIC CHOLECYSTECTOMY
Anesthesia: General

## 2023-04-09 MED ORDER — CHLORHEXIDINE GLUCONATE CLOTH 2 % EX PADS: 6.0000 | MEDICATED_PAD | Freq: Once | CUTANEOUS | Status: DC

## 2023-04-09 MED ORDER — FENTANYL CITRATE (PF) 100 MCG/2ML IJ SOLN
INTRAMUSCULAR | Status: DC | PRN
  Administered 2023-04-09 (×3): 50 ug via INTRAVENOUS
  Administered 2023-04-09: 100 ug via INTRAVENOUS

## 2023-04-09 MED ORDER — OXYCODONE HCL 5 MG PO TABS
5.0000 mg | ORAL_TABLET | Freq: Three times a day (TID) | ORAL | 0 refills | Status: AC | PRN
Start: 2023-04-09 — End: 2023-04-14

## 2023-04-09 MED ORDER — OXYCODONE HCL 5 MG PO TABS
5.0000 mg | ORAL_TABLET | Freq: Once | ORAL | Status: AC | PRN
  Administered 2023-04-09: 5 mg via ORAL

## 2023-04-09 MED ORDER — ACETAMINOPHEN 500 MG PO TABS
1000.0000 mg | ORAL_TABLET | ORAL | Status: DC
  Filled 2023-04-09: qty 2

## 2023-04-09 MED ORDER — ONDANSETRON HCL 4 MG/2ML IJ SOLN
INTRAMUSCULAR | Status: AC
  Filled 2023-04-09: qty 2

## 2023-04-09 MED ORDER — DEXAMETHASONE SODIUM PHOSPHATE 4 MG/ML IJ SOLN
INTRAMUSCULAR | Status: DC | PRN
Start: 2023-04-09 — End: 2023-04-09
  Administered 2023-04-09: 4 mg via INTRAVENOUS

## 2023-04-09 MED ORDER — GLYCOPYRROLATE 0.2 MG/ML IJ SOLN
INTRAMUSCULAR | Status: AC
  Filled 2023-04-09: qty 1

## 2023-04-09 MED ORDER — FENTANYL CITRATE (PF) 250 MCG/5ML IJ SOLN
INTRAMUSCULAR | Status: AC
  Filled 2023-04-09: qty 5

## 2023-04-09 MED ORDER — EPHEDRINE 5 MG/ML INJ
INTRAVENOUS | Status: AC
  Filled 2023-04-09: qty 5

## 2023-04-09 MED ORDER — 0.9 % SODIUM CHLORIDE (POUR BTL) OPTIME
TOPICAL | Status: DC | PRN
Start: 2023-04-09 — End: 2023-04-09
  Administered 2023-04-09: 1000 mL

## 2023-04-09 MED ORDER — DEXAMETHASONE SODIUM PHOSPHATE 10 MG/ML IJ SOLN
INTRAMUSCULAR | Status: AC
  Filled 2023-04-09: qty 1

## 2023-04-09 MED ORDER — HYDROMORPHONE HCL 1 MG/ML IJ SOLN: 0.2500 mg | INTRAMUSCULAR | Status: DC | PRN

## 2023-04-09 MED ORDER — PROPOFOL 10 MG/ML IV BOLUS
INTRAVENOUS | Status: DC | PRN
  Administered 2023-04-09 (×2): 35 mg via INTRAVENOUS
  Administered 2023-04-09: 150 mg via INTRAVENOUS

## 2023-04-09 MED ORDER — DROPERIDOL 2.5 MG/ML IJ SOLN
INTRAMUSCULAR | Status: AC
  Filled 2023-04-09: qty 2

## 2023-04-09 MED ORDER — LACTATED RINGERS IV SOLN: INTRAVENOUS | Status: DC

## 2023-04-09 MED ORDER — PROPOFOL 10 MG/ML IV BOLUS
INTRAVENOUS | Status: AC
  Filled 2023-04-09: qty 20

## 2023-04-09 MED ORDER — CEFAZOLIN SODIUM-DEXTROSE 2-4 GM/100ML-% IV SOLN
2.0000 g | INTRAVENOUS | Status: AC
  Administered 2023-04-09: 2 g via INTRAVENOUS
  Filled 2023-04-09: qty 100

## 2023-04-09 MED ORDER — INDOCYANINE GREEN 25 MG IV SOLR
2.5000 mg | Freq: Once | INTRAVENOUS | Status: AC
  Administered 2023-04-09: 2.5 mg via INTRAVENOUS

## 2023-04-09 MED ORDER — MEPERIDINE HCL 50 MG/ML IJ SOLN: 6.2500 mg | INTRAMUSCULAR | Status: DC | PRN

## 2023-04-09 MED ORDER — GLYCOPYRROLATE 0.2 MG/ML IJ SOLN
INTRAMUSCULAR | Status: DC | PRN
  Administered 2023-04-09 (×2): .1 mg via INTRAVENOUS

## 2023-04-09 MED ORDER — PROPOFOL 1000 MG/100ML IV EMUL
INTRAVENOUS | Status: AC
  Filled 2023-04-09: qty 100

## 2023-04-09 MED ORDER — CHLORHEXIDINE GLUCONATE 0.12 % MT SOLN: 15.0000 mL | Freq: Once | OROMUCOSAL | Status: DC

## 2023-04-09 MED ORDER — EPHEDRINE SULFATE (PRESSORS) 50 MG/ML IJ SOLN
INTRAMUSCULAR | Status: DC | PRN
  Administered 2023-04-09: 5 mg via INTRAVENOUS

## 2023-04-09 MED ORDER — ROCURONIUM BROMIDE 10 MG/ML (PF) SYRINGE
PREFILLED_SYRINGE | INTRAVENOUS | Status: AC
  Filled 2023-04-09: qty 10

## 2023-04-09 MED ORDER — ROCURONIUM BROMIDE 100 MG/10ML IV SOLN
INTRAVENOUS | Status: DC | PRN
  Administered 2023-04-09: 10 mg via INTRAVENOUS
  Administered 2023-04-09: 50 mg via INTRAVENOUS

## 2023-04-09 MED ORDER — PROPOFOL 500 MG/50ML IV EMUL
INTRAVENOUS | Status: DC | PRN
  Administered 2023-04-09: 50 ug/kg/min via INTRAVENOUS

## 2023-04-09 MED ORDER — ONDANSETRON HCL 4 MG/2ML IJ SOLN
INTRAMUSCULAR | Status: DC | PRN
  Administered 2023-04-09: 4 mg via INTRAVENOUS

## 2023-04-09 MED ORDER — MIDAZOLAM HCL 2 MG/2ML IJ SOLN
INTRAMUSCULAR | Status: AC
  Filled 2023-04-09: qty 2

## 2023-04-09 MED ORDER — PROMETHAZINE HCL 25 MG/ML IJ SOLN: 6.2500 mg | INTRAMUSCULAR | Status: DC | PRN

## 2023-04-09 MED ORDER — OXYCODONE HCL 5 MG/5ML PO SOLN: 5.0000 mg | Freq: Once | ORAL | Status: AC | PRN

## 2023-04-09 MED ORDER — LACTATED RINGERS IR SOLN
Status: DC | PRN
Start: 2023-04-09 — End: 2023-04-09
  Administered 2023-04-09: 1000 mL

## 2023-04-09 MED ORDER — SUGAMMADEX SODIUM 200 MG/2ML IV SOLN
INTRAVENOUS | Status: DC | PRN
  Administered 2023-04-09: 400 mg via INTRAVENOUS

## 2023-04-09 MED ORDER — DROPERIDOL 2.5 MG/ML IJ SOLN
INTRAMUSCULAR | Status: DC | PRN
Start: 2023-04-09 — End: 2023-04-09
  Administered 2023-04-09: .625 mg via INTRAVENOUS

## 2023-04-09 MED ORDER — BUPIVACAINE-EPINEPHRINE 0.25% -1:200000 IJ SOLN
INTRAMUSCULAR | Status: AC
  Filled 2023-04-09: qty 1

## 2023-04-09 MED ORDER — ARTIFICIAL TEARS OPHTHALMIC OINT
TOPICAL_OINTMENT | OPHTHALMIC | Status: AC
  Filled 2023-04-09: qty 3.5

## 2023-04-09 MED ORDER — OXYCODONE HCL 5 MG PO TABS
ORAL_TABLET | ORAL | Status: AC
  Filled 2023-04-09: qty 1

## 2023-04-09 MED ORDER — BUPIVACAINE-EPINEPHRINE 0.25% -1:200000 IJ SOLN
INTRAMUSCULAR | Status: DC | PRN
  Administered 2023-04-09: 30 mL

## 2023-04-09 MED ORDER — ORAL CARE MOUTH RINSE: 15.0000 mL | Freq: Once | OROMUCOSAL | Status: DC

## 2023-04-09 MED ORDER — MIDAZOLAM HCL 5 MG/5ML IJ SOLN
INTRAMUSCULAR | Status: DC | PRN
  Administered 2023-04-09: 1 mg via INTRAVENOUS

## 2023-04-09 SURGICAL SUPPLY — 49 items
ADH SKN CLS APL DERMABOND .7 (GAUZE/BANDAGES/DRESSINGS) ×1
APL PRP STRL LF DISP 70% ISPRP (MISCELLANEOUS) ×1
APL SRG 38 LTWT LNG FL B (MISCELLANEOUS)
APPLICATOR ARISTA FLEXITIP XL (MISCELLANEOUS) IMPLANT
APPLIER CLIP 5 13 M/L LIGAMAX5 (MISCELLANEOUS) ×1
APPLIER CLIP ROT 10 11.4 M/L (STAPLE)
APR CLP MED LRG 11.4X10 (STAPLE)
APR CLP MED LRG 5 ANG JAW (MISCELLANEOUS) ×1
BAG COUNTER SPONGE SURGICOUNT (BAG) IMPLANT
BAG SPNG CNTER NS LX DISP (BAG)
CABLE HIGH FREQUENCY MONO STRZ (ELECTRODE) ×1 IMPLANT
CHLORAPREP W/TINT 26 (MISCELLANEOUS) ×1 IMPLANT
CLIP APPLIE 5 13 M/L LIGAMAX5 (MISCELLANEOUS) ×1 IMPLANT
CLIP APPLIE ROT 10 11.4 M/L (STAPLE) IMPLANT
COVER MAYO STAND XLG (MISCELLANEOUS) ×1 IMPLANT
COVER SURGICAL LIGHT HANDLE (MISCELLANEOUS) ×1 IMPLANT
DERMABOND ADVANCED .7 DNX12 (GAUZE/BANDAGES/DRESSINGS) ×1 IMPLANT
DISSECTOR BLUNT TIP ENDO 5MM (MISCELLANEOUS) IMPLANT
DRAPE C-ARM 42X120 X-RAY (DRAPES) IMPLANT
ELECT PENCIL ROCKER SW 15FT (MISCELLANEOUS) ×1 IMPLANT
ELECT REM PT RETURN 15FT ADLT (MISCELLANEOUS) ×1 IMPLANT
GLOVE BIO SURGEON STRL SZ7.5 (GLOVE) ×1 IMPLANT
GLOVE INDICATOR 8.0 STRL GRN (GLOVE) ×1 IMPLANT
GOWN STRL REUS W/ TWL XL LVL3 (GOWN DISPOSABLE) ×2 IMPLANT
GOWN STRL REUS W/TWL XL LVL3 (GOWN DISPOSABLE) ×2
GRASPER SUT TROCAR 14GX15 (MISCELLANEOUS) IMPLANT
HEMOSTAT ARISTA ABSORB 3G PWDR (HEMOSTASIS) IMPLANT
HEMOSTAT SNOW SURGICEL 2X4 (HEMOSTASIS) IMPLANT
IRRIG SUCT STRYKERFLOW 2 WTIP (MISCELLANEOUS) ×1
IRRIGATION SUCT STRKRFLW 2 WTP (MISCELLANEOUS) ×1 IMPLANT
KIT BASIN OR (CUSTOM PROCEDURE TRAY) ×1 IMPLANT
KIT TURNOVER KIT A (KITS) IMPLANT
NDL INSUFFLATION 14GA 120MM (NEEDLE) IMPLANT
NEEDLE INSUFFLATION 14GA 120MM (NEEDLE)
SCISSORS LAP 5X35 DISP (ENDOMECHANICALS) ×1 IMPLANT
SET CHOLANGIOGRAPH MIX (MISCELLANEOUS) IMPLANT
SET TUBE SMOKE EVAC HIGH FLOW (TUBING) ×1 IMPLANT
SLEEVE ADV FIXATION 5X100MM (TROCAR) ×2 IMPLANT
SPIKE FLUID TRANSFER (MISCELLANEOUS) ×1 IMPLANT
SUT MNCRL AB 4-0 PS2 18 (SUTURE) ×1 IMPLANT
SUT VICRYL 0 UR6 27IN ABS (SUTURE) IMPLANT
SYR 20ML ECCENTRIC (SYRINGE) ×1 IMPLANT
SYS BAG RETRIEVAL 10MM (BASKET) ×1
SYSTEM BAG RETRIEVAL 10MM (BASKET) ×1 IMPLANT
TOWEL OR 17X26 10 PK STRL BLUE (TOWEL DISPOSABLE) ×1 IMPLANT
TOWEL OR NON WOVEN STRL DISP B (DISPOSABLE) IMPLANT
TRAY LAPAROSCOPIC (CUSTOM PROCEDURE TRAY) ×1 IMPLANT
TROCAR ADV FIXATION 5X100MM (TROCAR) ×1 IMPLANT
TROCAR BALLN 12MMX100 BLUNT (TROCAR) ×1 IMPLANT

## 2023-04-09 NOTE — H&P (Signed)
CC: Here today for surgery  HPI: Connie Cisneros is an 72 y.o. female with history of HTN, GERD, HLD, remote DVT whom is seen in the office today as a referral by Dr. Elnoria Cisneros for evaluation of symptomatic cholelithiasis.  She reports that she has noticed some crampy/sharp midepigastric and right upper quadrant pain over the last 1 year-2 episodes up until about 4 months ago. Over the last 4 months, however, she has began having attacks of this pain frequency of which is about twice per week now. The pain is described as a deep boring cramp/ache that will last for at least 20 to 30 minutes. Does most appear to be food related but has occurred a couple of times when she has not had anything to eat or drink in hours. The pain will be described as severe and unremitting. It will be associated with nausea/vomiting as well as cold sweats. The pain will gradually subside. She has been taking omeprazole as prescribed daily now for many months and has had no change in her symptoms in fact, has noticed some crescendoing of her symptoms. Workup to date from Dr. Elnoria Cisneros-  [facility] [place] 02/10/23 -  IMPRESSION: 1. Cholelithiasis without secondary signs of acute cholecystitis. 2. Increased hepatic parenchymal echogenicity suggestive of steatosis. CBD 3.1 mm; no gallbladder wall thickening or pericholecystic fluid  No LFTs on record around time of symptoms. LFT normal 06/2022  Currently, she denies any abdominal pain at present.   She denies any changes in health or health history since we met in the office. No new medications/allergies. She states she is ready for surgery today.  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( [facility] surgery center)   PONV (postoperative nausea and vomiting)    Psoriatic arthritis (HCC)    Rheumatoid arthritis (HCC)     Past Surgical History:  Procedure Laterality Date   ABDOMINAL HYSTERECTOMY N/A    Phreesia 04/05/2020   BREAST EXCISIONAL BIOPSY Left    SKIN CANCER EXCISION     SPINE SURGERY N/A    Phreesia 04/05/2020   TOTAL VAGINAL HYSTERECTOMY      Family History  Problem Relation Age of Onset   Breast cancer Mother    Cancer Mother        breast, stomach   Diabetes Father    Heart disease Father    Cancer Sister    Heart disease Paternal Grandmother    Heart disease Paternal Grandfather     Social:  reports that she has never smoked. She has never used smokeless tobacco. She reports that she does not drink alcohol and does not use drugs.  Allergies:  Allergies  Allergen Reactions   Losartan     Unclear if it was losartan or verapamil that caused angioedema, both are stopped   Verapamil     Stopped  by urgent care in 03/22/2016 for possible angioedema, losartan was stopped on 03/25/16 by cardiology as it is a more likely culprit   Codeine Nausea Only   Lisinopril Other (See Comments)    Sleepy and tired   Sulfa Antibiotics Nausea Only   Wellbutrin [Bupropion] Other (See Comments)    Reaction to losartan and verapamil    Medications: I have reviewed the patient's current medications.  No results found for this or any previous visit (from the past 48 hour(s)).  No results found.   PE There were no vitals taken for this visit. Constitutional: NAD; conversant Eyes: Moist conjunctiva; no lid lag; anicteric Lungs: Normal respiratory effort CV: RRR GI: Abd soft, NT/ND Psychiatric: Appropriate  affect  No results found for this or any previous visit (from the past 48 hour(s)).  No results found.  A/P: Connie Cisneros is an 72 y.o. female with hx of HTN, GERD, HLD, remote DVT here for evaluation of symptomatic cholelithiasis  -Cardiac clearance - Dr. Nicki Cisneros  -The anatomy and physiology of the hepatobiliary system was discussed with the patient with associated pictures. We also spent time reviewing the pathophysiology of gallbladder disease using her laparoscopic gallbladder surgery booklet -publisher: Krames, 11 pgs.  -The options for treatment were discussed including ongoing observation which carries some risk of subsequent gallbladder complications (infection, pancreatitis, choledocholithiasis, etc). We reviewed surgery as the most definitive treatment option moving forward - laparoscopic cholecystectomy with indocyanine green cholangiography -The planned procedure, material risks (including, but not limited to, pain, bleeding, infection, scarring, need for blood transfusion, damage to surrounding structures- blood vessels/nerves/viscus/organs, damage to bile duct, bile leak, chronic diarrhea, conversion to a 'subtotal' cholecystectomy and general expectations therein, post cholecystectomy diarrhea, need for additional procedures, hernia, worsening of pre-existing medical conditions, pancreatitis, pneumonia, heart attack, stroke, death) benefits and alternatives to surgery were discussed at length. I noted a good probability that the procedure would help improve their symptoms. The patient's questions were answered to her satisfaction, she voiced understanding and they elected to proceed with surgery. Additionally, we discussed typical postoperative expectations and the recovery process.   Connie Olp, MD [facility]

## 2023-04-09 NOTE — Discharge Instructions (Addendum)
POST OP INSTRUCTIONS  DIET: As tolerated. Follow a light bland diet the first 24 hours after arrival home, such as soup, liquids, crackers, etc.  Be sure to include lots of fluids daily.  Avoid fast food or heavy meals as your are more likely to get nauseated.  Eat a low fat the next few days after surgery.  Take your usually prescribed home medications unless otherwise directed.  PAIN CONTROL: Pain is best controlled by a usual combination of three different methods TOGETHER: Ice/Heat Over the counter pain medication Prescription pain medication Most patients will experience some swelling and bruising around the surgical site.  Ice packs or heating pads (30-60 minutes up to 6 times a day) will help. Some people prefer to use ice alone, heat alone, alternating between ice & heat.  Experiment to what works for you.  Swelling and bruising can take several weeks to resolve.   It is helpful to take an over-the-counter pain medication regularly for the first few weeks: Ibuprofen (Motrin/Advil) - '200mg'$  tabs - take 3 tabs ('600mg'$ ) every 6 hours as needed for pain Acetaminophen (Tylenol) - you may take '650mg'$  every 6 hours as needed. You can take this with motrin as they act differently on the body. If you are taking a narcotic pain medication that has acetaminophen in it, do not take over the counter tylenol at the same time.  Iii. NOTE: You may take both of these medications together - most patients  find it most helpful when alternating between the two (i.e. Ibuprofen at 6am,  tylenol at 9am, ibuprofen at 12pm ..Marland Kitchen) A  prescription for pain medication should be given to you upon discharge.  Take your pain medication as prescribed if your pain is not adequatly controlled with the over-the-counter pain reliefs mentioned above.  Avoid getting constipated.  Between the surgery and the pain medications, it is common to experience some constipation.  Increasing fluid intake and taking a fiber supplement (such as  Metamucil, Citrucel, FiberCon, MiraLax, etc) 1-2 times a day regularly will usually help prevent this problem from occurring.  A mild laxative (prune juice, Milk of Magnesia, MiraLax, etc) should be taken according to package directions if there are no bowel movements after 48 hours.    Dressing: Your incision is covered in Dermabond which is like sterile superglue for the skin. This will come off on it's own in a couple weeks. It is waterproof and you may bathe normally starting the day after your surgery in a shower. Avoid baths/pools/lakes/oceans until your wounds have fully healed.  ACTIVITIES as tolerated:   Avoid heavy lifting (>10lbs or 1 gallon of milk) for the next 6 weeks. You may resume regular (light) daily activities beginning the next day--such as daily self-care, walking, climbing stairs--gradually increasing activities as tolerated.  If you can walk 30 minutes without difficulty, it is safe to try more intense activity such as jogging, treadmill, bicycling, low-impact aerobics.  DO NOT PUSH THROUGH PAIN.  Let pain be your guide: If it hurts to do something, don't do it. You may drive when you are no longer taking prescription pain medication, you can comfortably wear a seatbelt, and you can safely maneuver your car and apply brakes.   FOLLOW UP in our office Please call [facility] at [phone] to set up an appointment to see your surgeon in the office for a follow-up appointment approximately 2 weeks after your surgery. Make sure that you call for this appointment the day you arrive home to  insure a convenient appointment time.  9. If you have disability or family leave forms that need to be completed, you may have them completed by your primary care physician's office; for return to work instructions, please ask our office staff and they will be happy to assist you in obtaining this documentation   When to call us [phone]: Poor pain control Reactions / problems with new  medications (rash/itching, etc)  Fever over 101.5 F (38.5 C) Inability to urinate Nausea/vomiting Worsening swelling or bruising Continued bleeding from incision. Increased pain, redness, or drainage from the incision  The clinic staff is available to answer your questions during regular business hours (8:30am-5pm).  Please don't hesitate to call and ask to speak to one of our nurses for clinical concerns.   A surgeon from [facility] is always on call at the hospitals   If you have a medical emergency, go to the nearest emergency room or call 911.  [facility] [address], [address], [place], [place]  [address] MAIN: [phone] FAX: [phone] [email or web address]  

## 2023-04-09 NOTE — Anesthesia Preprocedure Evaluation (Signed)
Anesthesia Evaluation  Patient identified by MRN, date of birth, ID band Patient awake    Reviewed: Allergy & Precautions, H&P , NPO status , Patient's Chart, lab work & pertinent test results  History of Anesthesia Complications (+) PONV and history of anesthetic complications  Airway Mallampati: II  TM Distance: >3 FB Neck ROM: Full    Dental no notable dental hx.    Pulmonary neg pulmonary ROS   Pulmonary exam normal breath sounds clear to auscultation       Cardiovascular hypertension, Pt. on medications negative cardio ROS Normal cardiovascular exam Rhythm:Regular Rate:Normal     Neuro/Psych  Headaches  negative psych ROS   GI/Hepatic Neg liver ROS,GERD  ,,  Endo/Other  negative endocrine ROS    Renal/GU negative Renal ROS  negative genitourinary   Musculoskeletal  (+) Arthritis , Osteoarthritis,    Abdominal  (+) + obese  Peds negative pediatric ROS (+)  Hematology negative hematology ROS (+)   Anesthesia Other Findings   Reproductive/Obstetrics negative OB ROS                             Anesthesia Physical Anesthesia Plan  ASA: 2  Anesthesia Plan: General   Post-op Pain Management: Dilaudid IV   Induction: Intravenous  PONV Risk Score and Plan: 4 or greater and Ondansetron, Dexamethasone, Midazolam, Droperidol and Treatment may vary due to age or medical condition  Airway Management Planned: Oral ETT  Additional Equipment:   Intra-op Plan:   Post-operative Plan: Extubation in OR  Informed Consent: I have reviewed the patients History and Physical, chart, labs and discussed the procedure including the risks, benefits and alternatives for the proposed anesthesia with the patient or authorized representative who has indicated his/her understanding and acceptance.     Dental advisory given  Plan Discussed with: CRNA  Anesthesia Plan Comments:         Anesthesia Quick Evaluation

## 2023-04-09 NOTE — Anesthesia Procedure Notes (Signed)
Procedure Name: Intubation Date/Time: 04/09/2023 10:39 AM  Performed by: Ahmed Prima, CRNAPre-anesthesia Checklist: Patient identified, Emergency Drugs available, Suction available and Patient being monitored Patient Re-evaluated:Patient Re-evaluated prior to induction Oxygen Delivery Method: Circle system utilized Preoxygenation: Pre-oxygenation with 100% oxygen Induction Type: IV induction Ventilation: Mask ventilation without difficulty and Oral airway inserted - appropriate to patient size Laryngoscope Size: Glidescope, Mac and 3 Grade View: Grade II Tube type: Oral Tube size: 7.0 mm Number of attempts: 1 Airway Equipment and Method: Stylet, Oral airway and Video-laryngoscopy Placement Confirmation: ETT inserted through vocal cords under direct vision, positive ETCO2 and breath sounds checked- equal and bilateral Secured at: 22 cm Tube secured with: Tape Dental Injury: Teeth and Oropharynx as per pre-operative assessment  Comments: Small tear to pt.'s upper lip during dl/intubation. Pt. Is an easy mask with #9 OA. Pt. Has small mouth opening and limited neck movement.

## 2023-04-09 NOTE — Anesthesia Postprocedure Evaluation (Signed)
Anesthesia Post Note  Patient: Connie Cisneros  Procedure(s) Performed: LAPAROSCOPIC CHOLECYSTECTOMY WITH ICG     Patient location during evaluation: PACU Anesthesia Type: General Level of consciousness: awake and alert Pain management: pain level controlled Vital Signs Assessment: post-procedure vital signs reviewed and stable Respiratory status: spontaneous breathing, nonlabored ventilation and respiratory function stable Cardiovascular status: blood pressure returned to baseline and stable Postop Assessment: no apparent nausea or vomiting Anesthetic complications: no   No notable events documented.  Last Vitals:  Vitals:   04/09/23 1345 04/09/23 1400  BP: (!) 172/96 (!) 177/86  Pulse: 75 76  Resp: 10 10  Temp:    SpO2: 93% 94%    Last Pain:  Vitals:   04/09/23 1400  TempSrc:   PainSc: 2                  Lowella Curb

## 2023-04-09 NOTE — Transfer of Care (Signed)
Immediate Anesthesia Transfer of Care Note  Patient: Connie Cisneros  Procedure(s) Performed: LAPAROSCOPIC CHOLECYSTECTOMY WITH ICG  Patient Location: PACU  Anesthesia Type:General  Level of Consciousness: drowsy  Airway & Oxygen Therapy: Patient Spontanous Breathing and Patient connected to face mask oxygen  Post-op Assessment: Report given to RN and Post -op Vital signs reviewed and stable  Post vital signs: Reviewed and stable  Last Vitals:  Vitals Value Taken Time  BP 153/75 04/09/23 1231  Temp    Pulse 82 04/09/23 1232  Resp 12 04/09/23 1232  SpO2 98 % 04/09/23 1232  Vitals shown include unfiled device data.  Last Pain:  Vitals:   04/09/23 0850  TempSrc:   PainSc: 0-No pain         Complications: No notable events documented.

## 2023-04-09 NOTE — Op Note (Signed)
04/09/2023 12:16 PM  PATIENT: Connie Cisneros  72 y.o. female  Patient Care Team: Donita Brooks, MD as PCP - General (Family Medicine) Lennette Bihari, MD as PCP - Cardiology (Cardiology) Glyn Ade, PA-C as Physician Assistant (Dermatology)  PRE-OPERATIVE DIAGNOSIS: Symptomatic cholelithiasis  POST-OPERATIVE DIAGNOSIS: "Chronic cholecystitis," cholelithiasis  PROCEDURE: Laparoscopic cholecystectomy with indocyanine green cholangiography  SURGEON: Marin Olp, MD  ASSISTANT: OR staff  ANESTHESIA: General endotracheal  EBL: 10 mL  DRAINS: None  SPECIMEN: Gallbladder  COUNTS: Sponge, needle and instrument counts were reported correct x2 at the conclusion of the operation  DISPOSITION: PACU in satisfactory condition  COMPLICATIONS: None  FINDINGS: Chronic omental adhesions coating much of the gallbladder.  Fibrotic type adhesions within the infundibulum.  Indocyanine green cholangiography demonstrates uptake by the liver and excretion into the biliary system.  Tracer is seen to the wall of the duodenum consistent with a patent biliary system.  There is also tracer seen within her candidate cystic duct and filling the gallbladder.  There is no tracer seen at our candidate cystic artery.  Critical view of safety was achieved prior to clipping or dividing structures.  DESCRIPTION:   The patient was identified & brought into the operating room.  She was then positioned supine on the OR table. SCDs were in place and active during the entire case.  She then underwent general endotracheal anesthesia. Pressure points were padded. Hair on the abdomen was clipped by the OR team. The abdomen was prepped and draped in the standard sterile fashion. Antibiotics were administered. A surgical timeout was performed and confirmed our plan.   A periumbilical incision was made. The umbilical stalk was grasped and retracted outwardly. The supraumbilical fascia was identified and  incised. The peritoneal cavity was gently entered bluntly. A purse-string 0 Vicryl suture was placed. The Hasson cannula was inserted into the peritoneal cavity and insufflation with CO2 commenced to . A laparoscope was inserted into the peritoneal cavity and inspection confirmed no evidence of trocar site complications. The patient was then positioned in reverse Trendelenburg with slight left side down. 3 additional 5mm trocars were placed along the right subcostal line - one 5mm port in mid subcostal region, another 5mm port in the right flank near the anterior axillary line, and a third 5mm port in the left subxiphoid region obliquely near the falciform ligament.  The liver and gallbladder were inspected.  The gallbladder is initially not visible this is coated in omental adhesions.  Were able to retract these and take them down from the dome of the gallbladder sharply.  The dome of the gallbladder was then elevated anteriorly.  Omental containing lesions were also now taken down from this.  After doing this, were able to expose the infundibular gallbladder.  The omentum was inspected and noted to be hemostatic. An additional grasper was then placed on the infundibulum of the gallbladder and the infundibulum was retracted laterally. Staying high on the gallbladder, the peritoneum on both sides of the gallbladder was opened with hook cautery. Gentle blunt dissection was then employed with a Art gallery manager working down into [employer]. The cystic duct was identified and carefully circumferentially dissected. The cystic artery was also identified and carefully circumferentially dissected.  There are fibrotic type adhesions within the triangle of Calot that we are able to carefully dissect.  The space between the cystic artery and hepatocystic plate was developed such that a good view of the liver could be seen through a window medial to  the cystic artery. The triangle of Calot had been cleared of  all fibrofatty tissue. At this point, a critical view of safety was achieved and the only structures visualized was the skeletonized cystic duct laterally, the skeletonized cystic artery and the liver through the window medial to the artery. No posterior cystic artery was noted  Under near-infrared light, ICG cholangiography demonstrates uptake by the liver and excretion into the biliary system.  There is tracer seen within the porta hepatis and extending all the way down to the duodenum.  There is opacification of the duodenum consistent with a patent biliary system.  There is opacification of our candidate cystic duct as well as the gallbladder.  There is no tracer activity seen within the candidate cystic artery.  The cystic duct and artery were clipped with 2 clips on the patient side and 1 clip on the specimen side. The cystic duct and artery were then divided. The gallbladder was then freed from its remaining attachments to the liver using electrocautery.  There is a diminutive type posterior cystic branch that were identified during removal from the liver.  This was high up on the gallbladder near the dome.  We ultimately controlled this near the dome using 5 mm clips.  Gallbladder was then removed and placed into an endocatch bag. The RUQ was gently irrigated with sterile saline. Hemostasis was then verified. The clips were in good position; the gallbladder fossa was dry. The rest of the abdomen was inspected no injury nor bleeding elsewhere was identified.  The endocatch bag containing the gallbladder was then removed from the umbilical port site and passed off as specimen. The RUQ ports were removed under direct visualization and noted to be hemostatic. The umbilical fascia was then closed using the 0 Vicryl purse-string suture. The fascia was palpated and noted to be completely closed. The skin of all incision sites was approximated with 4-0 monocryl subcuticular suture and dermabond applied.  She  was then awakened from anesthesia, extubated, and transferred to a stretcher for transport to PACU in satisfactory condition.

## 2023-04-10 ENCOUNTER — Encounter (HOSPITAL_COMMUNITY): Payer: Self-pay | Admitting: Surgery

## 2023-04-10 LAB — SURGICAL PATHOLOGY

## 2023-07-18 ENCOUNTER — Ambulatory Visit
Admission: RE | Admit: 2023-07-18 | Discharge: 2023-07-18 | Disposition: A | Discharge status: Home / Self Care | Payer: 59 | Source: Ambulatory Visit | Attending: Obstetrics and Gynecology | Admitting: Obstetrics and Gynecology

## 2023-07-18 DIAGNOSIS — N6310 Unspecified lump in the right breast, unspecified quadrant: Secondary | ICD-10-CM

## 2023-08-13 ENCOUNTER — Telehealth: Payer: Self-pay

## 2023-08-13 NOTE — Telephone Encounter (Signed)
 Copied from CRM [id number]. Topic: Clinical - Medical Advice >> Aug 12, 2023  3:39 PM Shelah Lewandowsky wrote: Reason for CRM: Patient asking if labs needed before appointment and would it be fasting labs.  Please call patient [phone]

## 2023-08-25 ENCOUNTER — Other Ambulatory Visit: Payer: 59

## 2023-08-25 DIAGNOSIS — E7800 Pure hypercholesterolemia, unspecified: Secondary | ICD-10-CM

## 2023-08-25 DIAGNOSIS — E041 Nontoxic single thyroid nodule: Secondary | ICD-10-CM

## 2023-08-25 DIAGNOSIS — I6529 Occlusion and stenosis of unspecified carotid artery: Secondary | ICD-10-CM

## 2023-08-25 DIAGNOSIS — I10 Essential (primary) hypertension: Secondary | ICD-10-CM

## 2023-08-26 ENCOUNTER — Ambulatory Visit (INDEPENDENT_AMBULATORY_CARE_PROVIDER_SITE_OTHER): Payer: 59 | Admitting: Family Medicine

## 2023-08-26 ENCOUNTER — Encounter: Payer: Self-pay | Admitting: Family Medicine

## 2023-08-26 VITALS — BP 130/78 | HR 85 | Temp 98.2°F | Ht 63.5 in | Wt 202.2 lb

## 2023-08-26 DIAGNOSIS — Z0000 Encounter for general adult medical examination without abnormal findings: Secondary | ICD-10-CM

## 2023-08-26 DIAGNOSIS — I10 Essential (primary) hypertension: Secondary | ICD-10-CM | POA: Diagnosis not present

## 2023-08-26 DIAGNOSIS — R296 Repeated falls: Secondary | ICD-10-CM | POA: Diagnosis not present

## 2023-08-26 DIAGNOSIS — Z0001 Encounter for general adult medical examination with abnormal findings: Secondary | ICD-10-CM

## 2023-08-26 DIAGNOSIS — Z23 Encounter for immunization: Secondary | ICD-10-CM

## 2023-08-26 DIAGNOSIS — E7800 Pure hypercholesterolemia, unspecified: Secondary | ICD-10-CM

## 2023-08-26 DIAGNOSIS — J329 Chronic sinusitis, unspecified: Secondary | ICD-10-CM

## 2023-08-26 LAB — CBC WITH DIFFERENTIAL/PLATELET
Absolute Lymphocytes: 2272 {cells}/uL (ref 850–3900)
Absolute Monocytes: 768 {cells}/uL (ref 200–950)
Basophils Absolute: 30 {cells}/uL (ref 0–200)
Basophils Relative: 0.4 %
Eosinophils Absolute: 144 {cells}/uL (ref 15–500)
Eosinophils Relative: 1.9 %
HCT: 47.5 % — ABNORMAL HIGH (ref 35.0–45.0)
Hemoglobin: 15.8 g/dL — ABNORMAL HIGH (ref 11.7–15.5)
MCH: 31.2 pg (ref 27.0–33.0)
MCHC: 33.3 g/dL (ref 32.0–36.0)
MCV: 93.7 fL (ref 80.0–100.0)
MPV: 9.7 fL (ref 7.5–12.5)
Monocytes Relative: 10.1 %
Neutro Abs: 4385 {cells}/uL (ref 1500–7800)
Neutrophils Relative %: 57.7 %
Platelets: 174 10*3/uL (ref 140–400)
RBC: 5.07 10*6/uL (ref 3.80–5.10)
RDW: 12.1 % (ref 11.0–15.0)
Total Lymphocyte: 29.9 %
WBC: 7.6 10*3/uL (ref 3.8–10.8)

## 2023-08-26 LAB — LIPID PANEL
Cholesterol: 170 mg/dL (ref <200)
HDL: 56 mg/dL (ref >=50)
LDL Cholesterol (Calc): 92 mg/dL
Non-HDL Cholesterol (Calc): 114 mg/dL (ref <130)
Total CHOL/HDL Ratio: 3 (calc) (ref <5.0)
Triglycerides: 121 mg/dL (ref <150)

## 2023-08-26 LAB — COMPLETE METABOLIC PANEL WITH GFR
AG Ratio: 1.3 (calc) (ref 1.0–2.5)
ALT: 14 U/L (ref 6–29)
AST: 15 U/L (ref 10–35)
Albumin: 4.3 g/dL (ref 3.6–5.1)
Alkaline phosphatase (APISO): 114 U/L (ref 37–153)
BUN/Creatinine Ratio: 10 (calc) (ref 6–22)
BUN: 12 mg/dL (ref 7–25)
CO2: 28 mmol/L (ref 20–32)
Calcium: 9.8 mg/dL (ref 8.6–10.4)
Chloride: 100 mmol/L (ref 98–110)
Creat: 1.2 mg/dL — ABNORMAL HIGH (ref 0.60–1.00)
Globulin: 3.2 g/dL (ref 1.9–3.7)
Glucose, Bld: 93 mg/dL (ref 65–99)
Potassium: 3.5 mmol/L (ref 3.5–5.3)
Sodium: 138 mmol/L (ref 135–146)
Total Bilirubin: 0.5 mg/dL (ref 0.2–1.2)
Total Protein: 7.5 g/dL (ref 6.1–8.1)
eGFR: 49 mL/min/{1.73_m2} — ABNORMAL LOW (ref >=60)

## 2023-08-26 LAB — THYROID PANEL WITH TSH
Free Thyroxine Index: 2 (ref 1.4–3.8)
T3 Uptake: 26 % (ref 22–35)
T4, Total: 7.6 ug/dL (ref 5.1–11.9)
TSH: 2.18 m[IU]/L (ref 0.40–4.50)

## 2023-08-26 MED ORDER — AMOXICILLIN 875 MG PO TABS: 875.0000 mg | ORAL_TABLET | Freq: Two times a day (BID) | ORAL | 0 refills | Status: AC

## 2023-08-26 MED ORDER — CHOLESTYRAMINE 4 G PO PACK: 4.0000 g | PACK | Freq: Two times a day (BID) | ORAL | 12 refills | Status: DC

## 2023-08-26 NOTE — Progress Notes (Signed)
Subjective:    Patient ID: Connie Cisneros, female    DOB: 23-May-1951, 73 y.o.   MRN: [id number]  HPI Patient has been battling a sinus infection for 2 weeks.  Symptoms include head congestion, postnasal drip, sinus pressure. She is also blowing purulent material out of both nasal passages.  She has tried over-the-counter medication without benefit.  She is here today mainly for a physical exam.  She is due for Capvaxive.  She is also due for the shingles vaccine.  Her mammogram was performed in 2024 and is up-to-date.  Her colonoscopy was performed in 2024 and was clear.  She does not require repeat colonoscopy.  Her bone density was performed in May 2024 and showed a T-score of -0.8.  I recommended she take calcium and vitamin D.  However she is also dealing with frequent diarrhea.  Ever since she had her gallbladder removed in 2024 she gets diarrhea every time she eats.  She also has abdominal cramps when she eats.  She would be interested in taking medication to try to prevent this.  She also has fallen 3 times in the last few months.  She almost fell getting off the exam table today.  She states that she has tripped twice while trying to walk up the steps of her home.  1 time she misjudged the step at night.  Other times she simply lost her balance.  Therefore I am concerned about her increased falling.  She states that she feels like her balance is getting worse.  She denies any neurologic deficits or stroke like symptoms.  Her most recent lab work is listed below Lab on 08/25/2023  Component Date Value Ref Range Status   T3 Uptake 08/25/2023 26  22 - 35 % Final   T4, Total 08/25/2023 7.6  5.1 - 11.9 mcg/dL Final   Free Thyroxine Index 08/25/2023 2.0  1.4 - 3.8 Final   TSH 08/25/2023 2.18  0.40 - 4.50 mIU/L Final   WBC 08/25/2023 7.6  3.8 - 10.8 Thousand/uL Final   RBC 08/25/2023 5.07  3.80 - 5.10 Million/uL Final   Hemoglobin 08/25/2023 15.8 (H)  11.7 - 15.5 g/dL Final   HCT 69/62/9528 47.5  (H)  35.0 - 45.0 % Final   MCV 08/25/2023 93.7  80.0 - 100.0 fL Final   MCH 08/25/2023 31.2  27.0 - 33.0 pg Final   MCHC 08/25/2023 33.3  32.0 - 36.0 g/dL Final   Comment: For adults, a slight decrease in the calculated MCHC value (in the range of 30 to 32 g/dL) is most likely not clinically significant; however, it should be interpreted with caution in correlation with other red cell parameters and the patient's clinical condition.    RDW 08/25/2023 12.1  11.0 - 15.0 % Final   Platelets 08/25/2023 174  140 - 400 Thousand/uL Final   MPV 08/25/2023 9.7  7.5 - 12.5 fL Final   Neutro Abs 08/25/2023 4,385  1,500 - 7,800 cells/uL Final   Absolute Lymphocytes 08/25/2023 2,272  850 - 3,900 cells/uL Final   Absolute Monocytes 08/25/2023 768  200 - 950 cells/uL Final   Eosinophils Absolute 08/25/2023 144  15 - 500 cells/uL Final   Basophils Absolute 08/25/2023 30  0 - 200 cells/uL Final   Neutrophils Relative % 08/25/2023 57.7  % Final   Total Lymphocyte 08/25/2023 29.9  % Final   Monocytes Relative 08/25/2023 10.1  % Final   Eosinophils Relative 08/25/2023 1.9  % Final   Basophils  Relative 08/25/2023 0.4  % Final   Glucose, Bld 08/25/2023 93  65 - 99 mg/dL Final   Comment: .            Fasting reference interval .    BUN 08/25/2023 12  7 - 25 mg/dL Final   Creat 78/46/9629 1.20 (H)  0.60 - 1.00 mg/dL Final   eGFR 52/84/1324 49 (L)  > OR = 60 mL/min/1.23m2 Final   BUN/Creatinine Ratio 08/25/2023 10  6 - 22 (calc) Final   Sodium 08/25/2023 138  135 - 146 mmol/L Final   Potassium 08/25/2023 3.5  3.5 - 5.3 mmol/L Final   Chloride 08/25/2023 100  98 - 110 mmol/L Final   CO2 08/25/2023 28  20 - 32 mmol/L Final   Calcium 08/25/2023 9.8  8.6 - 10.4 mg/dL Final   Total Protein 40/05/2724 7.5  6.1 - 8.1 g/dL Final   Albumin 36/64/4034 4.3  3.6 - 5.1 g/dL Final   Globulin 74/25/9563 3.2  1.9 - 3.7 g/dL (calc) Final   AG Ratio 08/25/2023 1.3  1.0 - 2.5 (calc) Final   Total Bilirubin 08/25/2023  0.5  0.2 - 1.2 mg/dL Final   Alkaline phosphatase (APISO) 08/25/2023 114  37 - 153 U/L Final   AST 08/25/2023 15  10 - 35 U/L Final   ALT 08/25/2023 14  6 - 29 U/L Final   Cholesterol 08/25/2023 170  <200 mg/dL Final   HDL 87/56/4332 56  > OR = 50 mg/dL Final   Triglycerides 95/18/8416 121  <150 mg/dL Final   LDL Cholesterol (Calc) 08/25/2023 92  mg/dL (calc) Final   Comment: Reference range: <100 . Desirable range <100 mg/dL for primary prevention;   <70 mg/dL for patients with CHD or diabetic patients  with > or = 2 CHD risk factors. Marland Kitchen LDL-C is now calculated using the Martin-Hopkins  calculation, which is a validated novel method providing  better accuracy than the Friedewald equation in the  estimation of LDL-C.  Horald Pollen et al. Lenox Ahr. 6063;016(01): 2061-2068  (http://education.[facility].com/faq/FAQ164)    Total CHOL/HDL Ratio 08/25/2023 3.0  <0.9 (calc) Final   Non-HDL Cholesterol (Calc) 08/25/2023 114  <130 mg/dL (calc) Final   Comment: For patients with diabetes plus 1 major ASCVD risk  factor, treating to a non-HDL-C goal of <100 mg/dL  (LDL-C of <32 mg/dL) is considered a therapeutic  option.     Past Medical History:  Diagnosis Date   Arthritis    Phreesia 04/05/2020   Atypical nevus 10/09/2006   slight-mderate-right upper thigh   Atypical nevus 01/24/2015   moderate-left back   Atypical nevus 02/22/2016   severe-right abdomen ([facility])   Atypical nevus 02/22/2016   mild-right low abdomen ([facility])   Cancer (HCC)    melanoma   Deep vein blood clot of right lower extremity (HCC)    leg   GERD (gastroesophageal reflux disease)    Heart murmur    History of migraine headaches    Hypertension    Left carotid artery stenosis    <20%   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( [facility])   PONV (postoperative nausea and vomiting)    Psoriatic arthritis (HCC)    Rheumatoid arthritis (HCC)    Past Surgical History:  Procedure Laterality  Date   ABDOMINAL HYSTERECTOMY N/A    Phreesia 04/05/2020   BREAST EXCISIONAL BIOPSY Left    CHOLECYSTECTOMY N/A 04/09/2023   Procedure: LAPAROSCOPIC CHOLECYSTECTOMY WITH ICG;  Surgeon: Andria Meuse, MD;  Location: [facility];  Service: General;  Laterality: N/A;   SKIN CANCER EXCISION     SPINE SURGERY N/A    Phreesia 04/05/2020   TOTAL VAGINAL HYSTERECTOMY     Current Outpatient Medications on File Prior to Visit  Medication Sig Dispense Refill   atorvastatin (LIPITOR) 40 MG tablet TAKE 1 TABLET(40 MG) BY MOUTH DAILY AT 6 PM 90 tablet 3   buPROPion (WELLBUTRIN XL) 150 MG 24 hr tablet Take 150 mg by mouth daily.     carvedilol (COREG) 6.25 MG tablet TAKE 1 TABLET(6.25 MG) BY MOUTH TWICE DAILY WITH A MEAL 90 tablet 3   cetirizine (ZYRTEC) 10 MG tablet Take 10 mg by mouth daily.     estradiol (ESTRACE) 1 MG tablet Take 0.5 mg by mouth daily.     fluticasone (FLONASE) 50 MCG/ACT nasal spray Place 2 sprays into both nostrils daily. (Patient taking differently: Place 2 sprays into both nostrils daily as needed for allergies.) 16 g 6   hydrochlorothiazide (MICROZIDE) 12.5 MG capsule TAKE 1 CAPSULE(12.5 MG) BY MOUTH DAILY 90 capsule 3   ibuprofen (ADVIL) 200 MG tablet Take 400 mg by mouth every 6 (six) hours as needed for moderate pain.     omeprazole (PRILOSEC) 20 MG capsule Take 1 capsule (20 mg total) by mouth daily. 90 capsule 3   RESTASIS 0.05 % ophthalmic emulsion Place 1 drop into both eyes every other day.     No current facility-administered medications on file prior to visit.   Allergies  Allergen Reactions   Losartan     Unclear if it was losartan or verapamil that caused angioedema, both are stopped   Verapamil     Stopped by urgent care in 03/22/2016 for possible angioedema, losartan was stopped on 03/25/16 by cardiology as it is a more likely culprit   Codeine Nausea Only   Lisinopril Other (See Comments)    Sleepy and tired   Sulfa Antibiotics Nausea Only   Wellbutrin  [Bupropion] Other (See Comments)    Reaction to losartan and verapamil   Social History   Socioeconomic History   Marital status: Married    Spouse name: Not on file   Number of children: Not on file   Years of education: Not on file   Highest education level: Not on file  Occupational History   Not on file  Tobacco Use   Smoking status: Never   Smokeless tobacco: Never  Vaping Use   Vaping status: Never Used  Substance and Sexual Activity   Alcohol use: No   Drug use: Never   Sexual activity: Not on file  Other Topics Concern   Not on file  Social History Narrative   Not on file   Social Drivers of Health   Financial Resource Strain: Not on file  Food Insecurity: Not on file  Transportation Needs: Not on file  Physical Activity: Not on file  Stress: Not on file  Social Connections: Not on file  Intimate Partner Violence: Not on file     Review of Systems  All other systems reviewed and are negative.      Objective:   Physical Exam Vitals reviewed.  Constitutional:      General: She is not in acute distress.    Appearance: Normal appearance. She is well-developed. She is not ill-appearing, toxic-appearing or diaphoretic.  HENT:     Right Ear: Tympanic membrane, ear canal and external ear normal.     Left Ear: Tympanic membrane, ear canal  and external ear normal.     Nose: Mucosal edema, congestion and rhinorrhea present.     Right Sinus: No maxillary sinus tenderness or frontal sinus tenderness.     Left Sinus: No maxillary sinus tenderness or frontal sinus tenderness.     Mouth/Throat:     Pharynx: Oropharynx is clear. No oropharyngeal exudate or posterior oropharyngeal erythema.  Eyes:     General:        Right eye: No discharge.        Left eye: No discharge.     Conjunctiva/sclera: Conjunctivae normal.  Cardiovascular:     Rate and Rhythm: Normal rate and regular rhythm.     Heart sounds: Normal heart sounds.  Pulmonary:     Effort: Pulmonary  effort is normal. No respiratory distress.     Breath sounds: Normal breath sounds. No wheezing or rales.  Chest:     Chest wall: No tenderness.  Musculoskeletal:     Cervical back: Neck supple.  Lymphadenopathy:     Cervical: No cervical adenopathy.  Neurological:     Mental Status: She is alert.           Assessment & Plan:  Frequent falls - Plan: Ambulatory referral to Physical Therapy  Need for vaccination - Plan: Pneumococcal Conjugate PCV21(Capvaxive), Zoster Recombinant (Shingrix )  General medical exam  Essential hypertension  Pure hypercholesterolemia  Rhinosinusitis Due to the frequent falls, I would like the patient to see physical therapy to try to improve her balance and reduce her fall risk.  She received a pneumonia vaccine today along with the first dose of Shingrix.  Breast cancer screening is up-to-date.  Mammogram is due again later this year.  Colonoscopy is up-to-date and does not need to be repeated.  Bone density does not need to be repeated for 3 years.  Blood pressure is excellent.  Cholesterol is outstanding.  I do believe the patient has a mild sinus infection.  Begin amoxicillin 875 mg twice daily for 10 days.  I will start the patient on cholestyramine 4 g twice daily for diarrhea after her cholecystectomy to see if this will help prevent intestinal spasms and watery diarrhea

## 2023-09-05 ENCOUNTER — Encounter: Payer: Self-pay | Admitting: Physical Therapy

## 2023-09-05 ENCOUNTER — Ambulatory Visit: Payer: 59 | Attending: Family Medicine | Admitting: Physical Therapy

## 2023-09-05 DIAGNOSIS — R2689 Other abnormalities of gait and mobility: Secondary | ICD-10-CM | POA: Insufficient documentation

## 2023-09-05 DIAGNOSIS — R2681 Unsteadiness on feet: Secondary | ICD-10-CM | POA: Insufficient documentation

## 2023-09-05 DIAGNOSIS — R296 Repeated falls: Secondary | ICD-10-CM | POA: Diagnosis not present

## 2023-09-05 DIAGNOSIS — Z9181 History of falling: Secondary | ICD-10-CM | POA: Diagnosis present

## 2023-09-05 DIAGNOSIS — R42 Dizziness and giddiness: Secondary | ICD-10-CM | POA: Diagnosis present

## 2023-09-05 DIAGNOSIS — M6281 Muscle weakness (generalized): Secondary | ICD-10-CM | POA: Diagnosis present

## 2023-09-05 NOTE — Therapy (Signed)
OUTPATIENT PHYSICAL THERAPY NEURO EVALUATION   Patient Name: Connie Cisneros MRN: [id number] DOB:1951/05/24, 73 y.o., female Today's Date: 09/05/2023   PCP: Donita Brooks, MD REFERRING PROVIDER: Donita Brooks, MD  END OF SESSION:  PT End of Session - 09/05/23 1318     Visit Number 1    Number of Visits 7   Plus eval   Date for PT Re-Evaluation 10/31/23    Authorization Type UHC    PT Start Time 1316    PT Stop Time 1358    PT Time Calculation (min) 42 min    Activity Tolerance Patient tolerated treatment well    Behavior During Therapy [facility] for tasks assessed/performed             Past Medical History:  Diagnosis Date   Arthritis    Phreesia 04/05/2020   Atypical nevus 10/09/2006   slight-mderate-right upper thigh   Atypical nevus 01/24/2015   moderate-left back   Atypical nevus 02/22/2016   severe-right abdomen (WS)   Atypical nevus 02/22/2016   mild-right low abdomen (WS)   Cancer (HCC)    melanoma   Deep vein blood clot of right lower extremity (HCC)    leg   GERD (gastroesophageal reflux disease)    Heart murmur    History of migraine headaches    Hypertension    Left carotid artery stenosis    <20%   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( [facility])   PONV (postoperative nausea and vomiting)    Psoriatic arthritis (HCC)    Rheumatoid arthritis (HCC)    Past Surgical History:  Procedure Laterality Date   ABDOMINAL HYSTERECTOMY N/A    Phreesia 04/05/2020   BREAST EXCISIONAL BIOPSY Left    CHOLECYSTECTOMY N/A 04/09/2023   Procedure: LAPAROSCOPIC CHOLECYSTECTOMY WITH ICG;  Surgeon: Andria Meuse, MD;  Location: [facility];  Service: General;  Laterality: N/A;   SKIN CANCER EXCISION     SPINE SURGERY N/A    Phreesia 04/05/2020   TOTAL VAGINAL HYSTERECTOMY     Patient Active Problem List   Diagnosis Date Noted   Benign heart murmur 05/31/2021   Vitamin D deficiency 05/31/2021   Cervical pseudoarthrosis,  sequela 11/03/2020   Neck pain 11/03/2020   Malignant melanoma of skin (HCC) 03/08/2020   Irritable bowel syndrome 03/06/2020   H/O angioedema 06/17/2019   History of rheumatoid arthritis 06/17/2019   Body mass index (BMI) 35.0-35.9, adult 05/18/2019   Rheumatoid arthritis (HCC) 03/11/2019   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/18/2018   Cervical spondylosis 08/18/2018   Chronic idiopathic constipation 01/27/2018   Family history of colon cancer 01/27/2018   GERD (gastroesophageal reflux disease) 01/27/2018   History of adenomatous polyp of colon 01/27/2018   Plantar fasciitis, left 06/24/2016   HTN (hypertension) 07/20/2013   Migraine headache 07/20/2013   Thyroid cyst 07/20/2013   Hyperlipidemia 07/20/2013   Carotid artery plaque 07/20/2013   Fatigue 04/13/2013    ONSET DATE: 08/26/2023  REFERRING DIAG: R29.6 (ICD-10-CM) - Frequent falls  THERAPY DIAG:  History of falling  Muscle weakness (generalized)  Other abnormalities of gait and mobility  Unsteadiness on feet  Dizziness and giddiness  Rationale for Evaluation and Treatment: Rehabilitation  SUBJECTIVE:  SUBJECTIVE STATEMENT: Pt presents without AD, states her balance has been off for about a year. States she also has BPPV causing her to feel off w/positional changes. States she has had BPPV since 2020 (diagnosed by Dr. Tanya Nones), never had it treated. Avoids turning her head to the right, as this triggers her dizziness. Pt reports when she has a fall, she cannot get her feet to move to catch herself. Has had a couple falls in the past 6 mo and several almost falls. Most occur in the dark (tends to fall forward) or when she moves too quickly. 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     Pt accompanied by: self  PERTINENT HISTORY: arthritis, HTN  PAIN:  Are you having pain? No Pt has psoriatic arthritis so back hurts frequently   PRECAUTIONS: Fall  RED FLAGS: None   WEIGHT BEARING RESTRICTIONS: No  FALLS: Has patient fallen in last 6 months? Yes. Number of falls at least two   LIVING ENVIRONMENT: Lives with: lives with their spouse Lives in: House/apartment Stairs: Yes: Internal: 2 full flights steps; on right going up, on left going up, and can reach both and External: 6-8 steps; on left going up Has following equipment at home: Single point cane and shower chair  PLOF: Independent  PATIENT GOALS: "Trying to get my balance"   OBJECTIVE:  Note: Objective measures were completed at Evaluation unless otherwise noted.  COGNITION: Overall cognitive status: Within functional limits for tasks assessed   SENSATION: Pt denies numbness/tingling in all extremities    POSTURE: rounded shoulders, forward head, and increased thoracic kyphosis  LOWER EXTREMITY ROM:     Active  Right Eval Left Eval  Hip flexion    Hip extension    Hip abduction    Hip adduction    Hip internal rotation    Hip external rotation    Knee flexion    Knee extension    Ankle dorsiflexion    Ankle plantarflexion    Ankle inversion    Ankle eversion     (Blank rows = not tested)  LOWER EXTREMITY MMT:  Tested in seated position  MMT Right Eval Left Eval  Hip flexion 5 5  Hip extension    Hip abduction 5 5  Hip adduction 5 5  Hip internal rotation    Hip external rotation    Knee flexion 5 5  Knee extension 5 5  Ankle dorsiflexion 5 5  Ankle plantarflexion    Ankle inversion    Ankle eversion    (Blank rows = not tested)  BED MOBILITY:  Independent per pt  TRANSFERS: Assistive device utilized: None  Sit to stand: Modified independence Stand to sit: Modified independence   GAIT: Gait pattern: step through pattern, decreased step length- Right,  decreased stride length, decreased hip/knee flexion- Right, decreased ankle dorsiflexion- Right, lateral hip instability, wide BOS, and poor foot clearance- Right Distance walked: Various clinic distances  Assistive device utilized: None Level of assistance: Modified independence Comments: Noted decreased eccentric control of RLE IC as well as decreased step clearance of RLE.   FUNCTIONAL TESTS:  MCTSIB: Condition 1: Avg of 3 trials: 30 sec, Condition 2: Avg of 3 trials: 30 sec, Condition 3: Avg of 3 trials: 30 sec, Condition 4: Avg of 3 trials: 16.22, 30 sec, and Total Score: 120/120 Noted significant A/P sway on conditions 2 & 4 (anterior > posterior)    [facility] PT Assessment -  09/05/23 1343       Transfers   Five time sit to stand comments  22.28s   no UE support, slow and guarded     Ambulation/Gait   Gait velocity 36m over 10.03s = 0.99 m/s no AD                                                                                                                                         TREATMENT:  Self-care/home management  Educated pt on the 3 balance systems and the role your vestibular system plays w/balance  Education on BPPV and the Epley maneuver (what to expect, etc.)   Role of PT w/balance and importance of doing exercises at home    PATIENT EDUCATION: Education details: POC, eval findings, see self-care  Person educated: Patient Education method: Explanation and Demonstration Education comprehension: verbalized understanding and needs further education  HOME EXERCISE PROGRAM: To be established   GOALS: Goals reviewed with patient? Yes  SHORT TERM GOALS: Target date: 10/03/2023   Pt will be independent with initial HEP for improved vestibular input, balance and gait.  Baseline: not established on eval  Goal status: INITIAL  2.  FGA to be assessed and LTG updated  Baseline:  Goal status: INITIAL  3.  SOT to be assessed and LTG updated  Baseline:  Goal  status: INITIAL   LONG TERM GOALS: Target date: 10/17/2023   Pt will improve 5 x STS to less than or equal to 18 seconds w/no BUE support to demonstrate improved functional strength and transfer efficiency.   Baseline: 22.38s no UE support  Goal status: INITIAL  2.  FGA goal  Baseline:  Goal status: INITIAL  3.  SOT goal  Baseline:  Goal status: INITIAL  4.  Pt will be independent with final HEP for improved vestibular input, balance and gait.  Baseline:  Goal status: INITIAL   ASSESSMENT:  CLINICAL IMPRESSION: Patient is a 73 year old female referred to Neuro OPPT for falls. Pt's PMH is significant for: arthritis, HTN and self-reported BPPV. The following deficits were present during the exam: impaired balance, decreased vestibular function and decreased functional strength per score on 5x STS. Based on falls history, pt is an incr risk for falls. Pt would benefit from skilled PT to address these impairments and functional limitations to maximize functional mobility independence.     OBJECTIVE IMPAIRMENTS: Abnormal gait, decreased balance, decreased mobility, difficulty walking, decreased strength, dizziness, and pain  ACTIVITY LIMITATIONS: carrying, lifting, bending, stairs, and locomotion level  PARTICIPATION LIMITATIONS: cleaning, laundry, shopping, community activity, and yard work  PERSONAL FACTORS: Age, Fitness, and 1 comorbidity: Arthritis  are also affecting patient's functional outcome.   REHAB POTENTIAL: Good  CLINICAL DECISION MAKING: Evolving/moderate complexity  EVALUATION COMPLEXITY: Moderate  PLAN:  PT FREQUENCY: 1x/week  PT DURATION: 6 weeks  PLANNED INTERVENTIONS: 97164- PT Re-evaluation, 97110-Therapeutic exercises, 97530- Therapeutic activity, 97112-  Neuromuscular re-education, [phone]- Self Care, 60454- Manual therapy, [phone]- Gait training, [phone]- Canalith repositioning, Patient/Family education, Balance training, Stair training, Dry Needling,  Vestibular training, and DME instructions  PLAN FOR NEXT SESSION: BPPV assessment. SOT and FGA and update goals. Establish HEP based on deficits highlighted by OM (pt reports she cannot turn head to R side)   Jill Alexanders Kolbee Bogusz, PT, DPT 09/05/2023, 3:08 PM

## 2023-09-12 ENCOUNTER — Encounter: Payer: Self-pay | Admitting: Physical Therapy

## 2023-09-12 ENCOUNTER — Ambulatory Visit: Payer: 59 | Attending: Family Medicine | Admitting: Physical Therapy

## 2023-09-12 VITALS — BP 138/80 | HR 75

## 2023-09-12 DIAGNOSIS — Z9181 History of falling: Secondary | ICD-10-CM | POA: Insufficient documentation

## 2023-09-12 DIAGNOSIS — R2681 Unsteadiness on feet: Secondary | ICD-10-CM | POA: Insufficient documentation

## 2023-09-12 DIAGNOSIS — R42 Dizziness and giddiness: Secondary | ICD-10-CM | POA: Insufficient documentation

## 2023-09-12 DIAGNOSIS — R2689 Other abnormalities of gait and mobility: Secondary | ICD-10-CM | POA: Insufficient documentation

## 2023-09-12 DIAGNOSIS — M6281 Muscle weakness (generalized): Secondary | ICD-10-CM | POA: Insufficient documentation

## 2023-09-12 NOTE — Therapy (Signed)
 OUTPATIENT PHYSICAL THERAPY - VESTIBULAR ASSESSMENT   Patient Name: Connie Cisneros MRN: [id number] DOB:10/11/1950, 73 y.o., female Today's Date: 09/12/2023   PCP: Duanne Butler DASEN, MD REFERRING PROVIDER: Duanne Butler DASEN, MD  END OF SESSION:  PT End of Session - 09/12/23 0848     Visit Number 2    Number of Visits 7   Plus eval   Date for PT Re-Evaluation 10/31/23    Authorization Type UHC    PT Start Time 0847    PT Stop Time 0927    PT Time Calculation (min) 40 min    Activity Tolerance Patient tolerated treatment well    Behavior During Therapy [facility] for tasks assessed/performed             Past Medical History:  Diagnosis Date   Arthritis    Phreesia 04/05/2020   Atypical nevus 10/09/2006   slight-mderate-right upper thigh   Atypical nevus 01/24/2015   moderate-left back   Atypical nevus 02/22/2016   severe-right abdomen (WS)   Atypical nevus 02/22/2016   mild-right low abdomen (WS)   Cancer (HCC)    melanoma   Deep vein blood clot of right lower extremity (HCC)    leg   GERD (gastroesophageal reflux disease)    Heart murmur    History of migraine headaches    Hypertension    Left carotid artery stenosis    <20%   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( [facility])   PONV (postoperative nausea and vomiting)    Psoriatic arthritis (HCC)    Rheumatoid arthritis (HCC)    Past Surgical History:  Procedure Laterality Date   ABDOMINAL HYSTERECTOMY N/A    Phreesia 04/05/2020   BREAST EXCISIONAL BIOPSY Left    CHOLECYSTECTOMY N/A 04/09/2023   Procedure: LAPAROSCOPIC CHOLECYSTECTOMY WITH ICG;  Surgeon: Teresa Lonni HERO, MD;  Location: [facility];  Service: General;  Laterality: N/A;   SKIN CANCER EXCISION     SPINE SURGERY N/A    Phreesia 04/05/2020   TOTAL VAGINAL HYSTERECTOMY     Patient Active Problem List   Diagnosis Date Noted   Benign heart murmur 05/31/2021   Vitamin D deficiency 05/31/2021   Cervical pseudoarthrosis,  sequela 11/03/2020   Neck pain 11/03/2020   Malignant melanoma of skin (HCC) 03/08/2020   Irritable bowel syndrome 03/06/2020   H/O angioedema 06/17/2019   History of rheumatoid arthritis 06/17/2019   Body mass index (BMI) 35.0-35.9, adult 05/18/2019   Rheumatoid arthritis (HCC) 03/11/2019   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/18/2018   Cervical spondylosis 08/18/2018   Chronic idiopathic constipation 01/27/2018   Family history of colon cancer 01/27/2018   GERD (gastroesophageal reflux disease) 01/27/2018   History of adenomatous polyp of colon 01/27/2018   Plantar fasciitis, left 06/24/2016   HTN (hypertension) 07/20/2013   Migraine headache 07/20/2013   Thyroid  cyst 07/20/2013   Hyperlipidemia 07/20/2013   Carotid artery plaque 07/20/2013   Fatigue 04/13/2013    ONSET DATE: 08/26/2023  REFERRING DIAG: R29.6 (ICD-10-CM) - Frequent falls  THERAPY DIAG:  Dizziness and giddiness  Unsteadiness on feet  Rationale for Evaluation and Treatment: Rehabilitation  SUBJECTIVE:  SUBJECTIVE STATEMENT: Reports if she is in the bed and rolls over on to her R side she has some spinning that lasts for a couple seconds. Closing her eyes, turning head to the R, bending over and looking up can make herself feel off balance. Feels like it could have something to do with her R ear. Feels like her R ear pops and cracks sometimes. Has not seen an ENT. Also feeling some general off balance when she's walking.   Pt accompanied by: self  PERTINENT HISTORY: arthritis, HTN, hx of migraines (pt reports have not had one in a while)     PAIN:  Are you having pain? No Pt has psoriatic arthritis so back hurts frequently   Vitals:   09/12/23 0856  BP: 138/80  Pulse: 75     PRECAUTIONS: Fall  RED  FLAGS: None   WEIGHT BEARING RESTRICTIONS: No  FALLS: Has patient fallen in last 6 months? Yes. Number of falls at least two   LIVING ENVIRONMENT: Lives with: lives with their spouse Lives in: House/apartment Stairs: Yes: Internal: 2 full flights steps; on right going up, on left going up, and can reach both and External: 6-8 steps; on left going up Has following equipment at home: Single point cane and shower chair  PLOF: Independent  PATIENT GOALS: Trying to get my balance   OBJECTIVE:  Note: Objective measures were completed at Evaluation unless otherwise noted.  COGNITION: Overall cognitive status: Within functional limits for tasks assessed   SENSATION: Pt denies numbness/tingling in all extremities    POSTURE: rounded shoulders, forward head, and increased thoracic kyphosis  LOWER EXTREMITY ROM:     Active  Right Eval Left Eval  Hip flexion    Hip extension    Hip abduction    Hip adduction    Hip internal rotation    Hip external rotation    Knee flexion    Knee extension    Ankle dorsiflexion    Ankle plantarflexion    Ankle inversion    Ankle eversion     (Blank rows = not tested)  LOWER EXTREMITY MMT:  Tested in seated position  MMT Right Eval Left Eval  Hip flexion 5 5  Hip extension    Hip abduction 5 5  Hip adduction 5 5  Hip internal rotation    Hip external rotation    Knee flexion 5 5  Knee extension 5 5  Ankle dorsiflexion 5 5  Ankle plantarflexion    Ankle inversion    Ankle eversion    (Blank rows = not tested)  BED MOBILITY:  Independent per pt  TRANSFERS: Assistive device utilized: None  Sit to stand: Modified independence Stand to sit: Modified independence   GAIT: Gait pattern: step through pattern, decreased step length- Right, decreased stride length, decreased hip/knee flexion- Right, decreased ankle dorsiflexion- Right, lateral hip instability, wide BOS, and poor foot clearance- Right Distance walked: Various  clinic distances  Assistive device utilized: None Level of assistance: Modified independence Comments: Noted decreased eccentric control of RLE IC as well as decreased step clearance of RLE.   FUNCTIONAL TESTS:  MCTSIB: Condition 1: Avg of 3 trials: 30 sec, Condition 2: Avg of 3 trials: 30 sec, Condition 3: Avg of 3 trials: 30 sec, Condition 4: Avg of 3 trials: 16.22, 30 sec, and Total Score: 120/120 Noted significant A/P sway on conditions 2 & 4 (anterior > posterior)  TREATMENT:   VESTIBULAR ASSESSMENT   GENERAL OBSERVATION: Ambulates in with no AD independently.     SYMPTOM BEHAVIOR:   Subjective history: See above, reports spinning dizziness has been going on since the start of COVID    Non-Vestibular symptoms: headaches   Type of dizziness: Spinning/Vertigo and feels like a wobble toy, feels like she wants to go forward and tends to be in the dark    Frequency: Every now and then    Duration: When spinning episodes happen - only lasts for a couple seconds    Aggravating factors: Induced by position change: rolling to the right and Induced by motion: looking up at the ceiling, bending down to the ground, and turning body quickly, will feel unsteady when closing eyes    Relieving factors:  holding on to something    Progression of symptoms: unchanged   OCULOMOTOR EXAM:   Ocular Alignment: normal   Ocular ROM: No Limitations   Spontaneous Nystagmus: absent   Gaze-Induced Nystagmus: absent   Smooth Pursuits: intact   Saccades: intact      VESTIBULAR - OCULAR REFLEX:    Slow VOR: Normal   VOR Cancellation: Normal   Head-Impulse Test: HIT Right: positive HIT Left: unable to accurately assess, pt blinking Pt reporting a brief dizzy   Dynamic Visual Acuity: Static: Line 9  Dynamic: Line 5 4 line difference, pt reports feeling woozy afterwards     POSITIONAL TESTING: Right Dix-Hallpike: no nystagmus Left Dix-Hallpike: no nystagmus Right Roll Test: no nystagmus Left Roll Test: no nystagmus Right Sidelying: no nystagmus and reports feeling funny in position, dizziness when coming upright  Left Sidelying: no nystagmus and dizziness when coming upright    NMR:  Gaze Adaptation: x1 Viewing Horizontal: Position: Standing, Time: 30 seconds, Reps: 2, and Comment: Cues for technique and moving just head   Access Code: [email or web address] URL: [email or web address] Date: 09/12/2023 Prepared by: Sheffield Senate  Exercises - Romberg Stance Eyes Closed on Foam Pad  - 1-2 x daily - 5 x weekly - 3 sets - 30 hold - performed with slight space between feet, pt with tendency to have more weight shift anteriorly onto toes    PATIENT EDUCATION: Education details:  Discussed vestibular eval findings - pt negative for BPPV, but does appear to have a vestibular hypofunction (based on balance with EC, VOR deficits) and PT unsure why, initial HEP for balance/VOR, discussed getting a referral to ENT from PCP regarding pt reporting some crackling in R ear and some fullness  Person educated: Patient Education method: Explanation, Demonstration, Verbal cues, and Handouts Education comprehension: verbalized understanding, returned demonstration, and needs further education  HOME EXERCISE PROGRAM: Standing horizontal VOR x1 30 seconds   Access Code: [email or web address] URL: [email or web address] Date: 09/12/2023 Prepared by: Sheffield Senate  Exercises - Romberg Stance Eyes Closed on Foam Pad  - 1-2 x daily - 5 x weekly - 3 sets - 30 hold   GOALS: Goals reviewed with patient? Yes  SHORT TERM GOALS: Target date: 10/03/2023   Pt will be independent with initial HEP for improved vestibular input, balance and gait.  Baseline: not established on eval  Goal status: INITIAL  2.  FGA to be assessed and LTG updated  Baseline:  Goal  status: INITIAL  3.  SOT to be assessed and LTG updated  Baseline:  Goal status: INITIAL   LONG TERM GOALS: Target date: 10/17/2023   Pt will improve 5 x STS to less than or equal to 18 seconds w/no  BUE support to demonstrate improved functional strength and transfer efficiency.   Baseline: 22.38s no UE support  Goal status: INITIAL  2.  FGA goal  Baseline:  Goal status: INITIAL  3.  SOT goal  Baseline:  Goal status: INITIAL  4.  Pt will be independent with final HEP for improved vestibular input, balance and gait.  Baseline:  Goal status: INITIAL   ASSESSMENT:  CLINICAL IMPRESSION: Today's skilled session focused on vestibular assessment. Pt negative for BPPV, but did have some dizziness with coming upright from sidelying positions. Pt with a positive HIT to the R (unable to accurately assess to the L due to blinking) and 4 line difference on DVA, indicating impaired VOR. At eval, pt also with deficits with balance with EC. 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 Started pt on standing VOR exercises and balance with EC for incr vestibular input for HEP. Will continue per POC.   OBJECTIVE IMPAIRMENTS: Abnormal gait, decreased balance, decreased mobility, difficulty walking, decreased strength, dizziness, and pain  ACTIVITY LIMITATIONS: carrying, lifting, bending, stairs, and locomotion level  PARTICIPATION LIMITATIONS: cleaning, laundry, shopping, community activity, and yard work  PERSONAL FACTORS: Age, Fitness, and 1 comorbidity: Arthritis  are also affecting patient's functional outcome.   REHAB POTENTIAL: Good  CLINICAL DECISION MAKING: Evolving/moderate complexity  EVALUATION COMPLEXITY: Moderate  PLAN:  PT FREQUENCY: 1x/week  PT DURATION: 6 weeks  PLANNED INTERVENTIONS: 97164- PT Re-evaluation, 97110-Therapeutic exercises, 97530- Therapeutic activity, 97112- Neuromuscular re-education, 97535- Self Care, [id number]- Manual  therapy, [id number]- Gait training, [phone]- Canalith repositioning, Patient/Family education, Balance training, Stair training, Dry Needling, Vestibular training, and DME instructions  PLAN FOR NEXT SESSION: SOT and FGA and update goals. Add to HEP - [employer] , EC balance, head motions.   Sheffield LOISE Senate, PT, DPT 09/12/2023, 9:51 AM

## 2023-09-12 NOTE — Patient Instructions (Signed)
 Gaze Stabilization: Standing Feet Apart    Feet shoulder width apart, keeping eyes on target on wall __a few__ feet away, tilt head down 15-30 and move head side to side for ___30_ seconds.  Perform 2-3 sets of each   Do _1___ sessions per day.  Copyright  VHI. All rights reserved.

## 2023-09-19 ENCOUNTER — Encounter: Payer: Self-pay | Admitting: Physical Therapy

## 2023-09-19 ENCOUNTER — Ambulatory Visit: Payer: 59 | Admitting: Physical Therapy

## 2023-09-19 DIAGNOSIS — R42 Dizziness and giddiness: Secondary | ICD-10-CM

## 2023-09-19 DIAGNOSIS — R2681 Unsteadiness on feet: Secondary | ICD-10-CM

## 2023-09-19 DIAGNOSIS — Z9181 History of falling: Secondary | ICD-10-CM

## 2023-09-19 NOTE — Patient Instructions (Signed)
Gaze Stabilization: Standing Feet Apart    Feet shoulder width apart, keeping eyes on target on wall _a few___ feet away, tilt head down 15-30 and move head side to side for __30__ seconds.  Perform 3 sets of each.  Keep eyes on the X the whole time and make sure it stays in focus! Just move your head  Do __1-2__ sessions per day.   Copyright  VHI. All rights reserved.

## 2023-09-19 NOTE — Therapy (Signed)
OUTPATIENT PHYSICAL THERAPY TREATMENT   Patient Name: Connie Cisneros MRN: [id number] DOB:1950/09/01, 73 y.o., female Today's Date: 09/19/2023   PCP: Donita Brooks, MD REFERRING PROVIDER: Donita Brooks, MD  END OF SESSION:  PT End of Session - 09/19/23 0848     Visit Number 3    Number of Visits 7   Plus eval   Date for PT Re-Evaluation 10/31/23    Authorization Type UHC    PT Start Time 0846    PT Stop Time 0927    PT Time Calculation (min) 41 min    Equipment Utilized During Treatment Gait belt    Activity Tolerance Patient tolerated treatment well    Behavior During Therapy [facility] for tasks assessed/performed             Past Medical History:  Diagnosis Date   Arthritis    Phreesia 04/05/2020   Atypical nevus 10/09/2006   slight-mderate-right upper thigh   Atypical nevus 01/24/2015   moderate-left back   Atypical nevus 02/22/2016   severe-right abdomen (WS)   Atypical nevus 02/22/2016   mild-right low abdomen (WS)   Cancer (HCC)    melanoma   Deep vein blood clot of right lower extremity (HCC)    leg   GERD (gastroesophageal reflux disease)    Heart murmur    History of migraine headaches    Hypertension    Left carotid artery stenosis    <20%   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( [facility])   PONV (postoperative nausea and vomiting)    Psoriatic arthritis (HCC)    Rheumatoid arthritis (HCC)    Past Surgical History:  Procedure Laterality Date   ABDOMINAL HYSTERECTOMY N/A    Phreesia 04/05/2020   BREAST EXCISIONAL BIOPSY Left    CHOLECYSTECTOMY N/A 04/09/2023   Procedure: LAPAROSCOPIC CHOLECYSTECTOMY WITH ICG;  Surgeon: Andria Meuse, MD;  Location: [facility];  Service: General;  Laterality: N/A;   SKIN CANCER EXCISION     SPINE SURGERY N/A    Phreesia 04/05/2020   TOTAL VAGINAL HYSTERECTOMY     Patient Active Problem List   Diagnosis Date Noted   Benign heart murmur 05/31/2021   Vitamin D deficiency  05/31/2021   Cervical pseudoarthrosis, sequela 11/03/2020   Neck pain 11/03/2020   Malignant melanoma of skin (HCC) 03/08/2020   Irritable bowel syndrome 03/06/2020   H/O angioedema 06/17/2019   History of rheumatoid arthritis 06/17/2019   Body mass index (BMI) 35.0-35.9, adult 05/18/2019   Rheumatoid arthritis (HCC) 03/11/2019   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/18/2018   Cervical spondylosis 08/18/2018   Chronic idiopathic constipation 01/27/2018   Family history of colon cancer 01/27/2018   GERD (gastroesophageal reflux disease) 01/27/2018   History of adenomatous polyp of colon 01/27/2018   Plantar fasciitis, left 06/24/2016   HTN (hypertension) 07/20/2013   Migraine headache 07/20/2013   Thyroid cyst 07/20/2013   Hyperlipidemia 07/20/2013   Carotid artery plaque 07/20/2013   Fatigue 04/13/2013    ONSET DATE: 08/26/2023  REFERRING DIAG: R29.6 (ICD-10-CM) - Frequent falls  THERAPY DIAG:  Dizziness and giddiness  Unsteadiness on feet  History of falling  Rationale for Evaluation and Treatment: Rehabilitation  SUBJECTIVE:  SUBJECTIVE STATEMENT: Had a fall since she was last here. Was holding a jug of water and tea. Was going up the stairs and was leaning on the rail, and when she got to the top, felt she was leaning too far forward and had a fall.  Has a big rug burn on her elbow. Larey Seat more on her L side and her knee is also a little sore. Did not hit her head. Reports her husband had to help pick her up. This happened on Tuesday. Notes with her VOR exercise, going to the L is a little harder.   Pt accompanied by: self  PERTINENT HISTORY: arthritis, HTN, hx of migraines (pt reports have not had one in a while)  PAIN:  Are you having pain? No Pt has psoriatic arthritis so back  hurts frequently   There were no vitals filed for this visit.  PRECAUTIONS: Fall  RED FLAGS: None   WEIGHT BEARING RESTRICTIONS: No  FALLS: Has patient fallen in last 6 months? Yes. Number of falls at least two   LIVING ENVIRONMENT: Lives with: lives with their spouse Lives in: House/apartment Stairs: Yes: Internal: 2 full flights steps; on right going up, on left going up, and can reach both and External: 6-8 steps; on left going up Has following equipment at home: Single point cane and shower chair  PLOF: Independent  PATIENT GOALS: "Trying to get my balance"   OBJECTIVE:  Note: Objective measures were completed at Evaluation unless otherwise noted.  COGNITION: Overall cognitive status: Within functional limits for tasks assessed   SENSATION: Pt denies numbness/tingling in all extremities    POSTURE: rounded shoulders, forward head, and increased thoracic kyphosis  LOWER EXTREMITY ROM:     Active  Right Eval Left Eval  Hip flexion    Hip extension    Hip abduction    Hip adduction    Hip internal rotation    Hip external rotation    Knee flexion    Knee extension    Ankle dorsiflexion    Ankle plantarflexion    Ankle inversion    Ankle eversion     (Blank rows = not tested)  LOWER EXTREMITY MMT:  Tested in seated position  MMT Right Eval Left Eval  Hip flexion 5 5  Hip extension    Hip abduction 5 5  Hip adduction 5 5  Hip internal rotation    Hip external rotation    Knee flexion 5 5  Knee extension 5 5  Ankle dorsiflexion 5 5  Ankle plantarflexion    Ankle inversion    Ankle eversion    (Blank rows = not tested)  BED MOBILITY:  Independent per pt  TRANSFERS: Assistive device utilized: None  Sit to stand: Modified independence Stand to sit: Modified independence   GAIT: Gait pattern: step through pattern, decreased step length- Right, decreased stride length, decreased hip/knee flexion- Right, decreased ankle dorsiflexion- Right,  lateral hip instability, wide BOS, and poor foot clearance- Right Distance walked: Various clinic distances  Assistive device utilized: None Level of assistance: Modified independence Comments: Noted decreased eccentric control of RLE IC as well as decreased step clearance of RLE.   FUNCTIONAL TESTS:  MCTSIB: Condition 1: Avg of 3 trials: 30 sec, Condition 2: Avg of 3 trials: 30 sec, Condition 3: Avg of 3 trials: 30 sec, Condition 4: Avg of 3 trials: 16.22, 30 sec, and Total Score: 120/120 Noted significant A/P sway on conditions 2 & 4 (anterior > posterior)  TREATMENT:  Therapeutic Activity:    [facility] PT Assessment - 09/19/23 0853       Functional Gait  Assessment   Gait assessed  Yes    Gait Level Surface Walks 20 ft, slow speed, abnormal gait pattern, evidence for imbalance or deviates 10-15 in outside of the 12 in walkway width. Requires more than 7 sec to ambulate 20 ft.   7.2   Change in Gait Speed Able to smoothly change walking speed without loss of balance or gait deviation. Deviate no more than 6 in outside of the 12 in walkway width.    Gait with Horizontal Head Turns Performs head turns smoothly with slight change in gait velocity (eg, minor disruption to smooth gait path), deviates 6-10 in outside 12 in walkway width, or uses an assistive device.    Gait with Vertical Head Turns Performs task with slight change in gait velocity (eg, minor disruption to smooth gait path), deviates 6 - 10 in outside 12 in walkway width or uses assistive device    Gait and Pivot Turn Pivot turns safely within 3 sec and stops quickly with no loss of balance.    Step Over Obstacle Is able to step over one shoe box (4.5 in total height) without changing gait speed. No evidence of imbalance.    Gait with Narrow Base of Support Ambulates less than 4 steps heel to toe or cannot perform without  assistance.    Gait with Eyes Closed Walks 20 ft, slow speed, abnormal gait pattern, evidence for imbalance, deviates 10-15 in outside 12 in walkway width. Requires more than 9 sec to ambulate 20 ft.   9.5 seconds   Ambulating Backwards Walks 20 ft, uses assistive device, slower speed, mild gait deviations, deviates 6-10 in outside 12 in walkway width.    Steps Alternating feet, must use rail.    Total Score 18    FGA comment: 18/30 = High Fall Risk            Worked on floor transfers for fall recovery after recent fall: Educated on making sure that pt is not hurt on injured before trying to get up off the floor. Demonstrated getting into quadruped then crawling to a stable surface and from there getting into tall kneel > half kneel. Pt getting on the floor mod I and then getting into sidelying and into quadruped. Pt able to use BUE support and tall kneeling position to stand up from floor mod I. Pt reporting feeling good with this technique and has no further questions at this time     NMR:  On air ex: with slight space between feet: EO 2 x 10 reps head turns, 2 x 10 reps head nods, pt more unsteady with head turns Tandem gait down and back x2 reps   Reviewed HEP given from last week:  Balance with EC on foam with slight space between feet 3 x 30 seconds  Gaze Adaptation: x1 Viewing Horizontal: Position: Standing, Time: 30 seconds, Reps: 3, and Comment: Cues for technique and moving just head vs. Trunk. Pt has more difficulty with keeping L eye on the target   PATIENT EDUCATION: Education details: Results of FGA, floor recovery transfers due to recent fall, review of previous HEP and new additions for balance, purpose of exercises and performing them frequently at home due to pt's frequency being 1x a week  Person educated: Patient Education method: Explanation, Demonstration, Verbal cues, and Handouts Education comprehension: verbalized understanding, returned demonstration, and  needs further education  HOME EXERCISE PROGRAM: Standing horizontal VOR x1 30 seconds   Access Code: [email or web address] URL: [email or web address] Date: 09/19/2023 Prepared by: Sherlie Ban  Exercises - Romberg Stance Eyes Closed on Foam Pad  - 1-2 x daily - 5 x weekly - 3 sets - 30 hold - Romberg Stance on Foam Pad with Head Rotation  - 1-2 x daily - 5 x weekly - 2 sets - 10 reps - Tandem Walking with Counter Support  - 1-2 x daily - 5 x weekly - 3 sets  GOALS: Goals reviewed with patient? Yes  SHORT TERM GOALS: Target date: 10/03/2023   Pt will be independent with initial HEP for improved vestibular input, balance and gait.  Baseline: not established on eval  Goal status: INITIAL  2.  FGA to be assessed and LTG updated  Baseline: 18/30 Goal status: MET  3.  SOT to be assessed and LTG updated  Baseline:  Goal status: INITIAL   LONG TERM GOALS: Target date: 10/17/2023   Pt will improve 5 x STS to less than or equal to 18 seconds w/no BUE support to demonstrate improved functional strength and transfer efficiency.   Baseline: 22.38s no UE support  Goal status: INITIAL  2. Pt will improve FGA to at least a 22/30 in order to demo decr fall risk.  Baseline: 18/30 Goal status: INITIAL  3.  SOT goal  Baseline:  Goal status: INITIAL  4.  Pt will be independent with final HEP for improved vestibular input, balance and gait.  Baseline:  Goal status: INITIAL   ASSESSMENT:  CLINICAL IMPRESSION: Performed the FGA with pt scoring an 18/30, indicating a high all risk. LTG updated as appropriate. Also worked on fall recovery due to pt's recent fall and pt had to get help from husband to stand up. Worked on using BUE support and getting into half kneeling position. Pt able to stand with mod I. Remainder of session focused on reviewing previous HEP and adding in more exercises for balance. Will continue per POC.   OBJECTIVE IMPAIRMENTS: Abnormal gait, decreased  balance, decreased mobility, difficulty walking, decreased strength, dizziness, and pain  ACTIVITY LIMITATIONS: carrying, lifting, bending, stairs, and locomotion level  PARTICIPATION LIMITATIONS: cleaning, laundry, shopping, community activity, and yard work  PERSONAL FACTORS: Age, Fitness, and 1 comorbidity: Arthritis  are also affecting patient's functional outcome.   REHAB POTENTIAL: Good  CLINICAL DECISION MAKING: Evolving/moderate complexity  EVALUATION COMPLEXITY: Moderate  PLAN:  PT FREQUENCY: 1x/week  PT DURATION: 6 weeks  PLANNED INTERVENTIONS: 97164- PT Re-evaluation, 97110-Therapeutic exercises, 97530- Therapeutic activity, 97112- Neuromuscular re-education, 97535- Self Care, 16109- Manual therapy, [phone]- Gait training, [phone]- Canalith repositioning, Patient/Family education, Balance training, Stair training, Dry Needling, Vestibular training, and DME instructions  PLAN FOR NEXT SESSION: SOT and update goals Austin Miles , EC balance, head motions. Progress VOR and vestibular system for balance    Drake Leach, PT, DPT 09/19/2023, 9:38 AM

## 2023-09-26 ENCOUNTER — Encounter: Payer: Self-pay | Admitting: Physical Therapy

## 2023-09-26 ENCOUNTER — Ambulatory Visit: Payer: 59 | Admitting: Physical Therapy

## 2023-09-26 VITALS — BP 153/77 | HR 64

## 2023-09-26 DIAGNOSIS — Z9181 History of falling: Secondary | ICD-10-CM

## 2023-09-26 DIAGNOSIS — R2689 Other abnormalities of gait and mobility: Secondary | ICD-10-CM

## 2023-09-26 DIAGNOSIS — R2681 Unsteadiness on feet: Secondary | ICD-10-CM

## 2023-09-26 DIAGNOSIS — R42 Dizziness and giddiness: Secondary | ICD-10-CM | POA: Diagnosis not present

## 2023-09-26 DIAGNOSIS — M6281 Muscle weakness (generalized): Secondary | ICD-10-CM

## 2023-09-26 NOTE — Therapy (Signed)
 OUTPATIENT PHYSICAL THERAPY TREATMENT   Patient Name: Connie Cisneros MRN: [id number] DOB:November 06, 1950, 73 y.o., female Today's Date: 09/29/2023   PCP: Donita Brooks, MD REFERRING PROVIDER: Donita Brooks, MD  END OF SESSION:   09/26/23 1449  PT Visits / Re-Eval  Visit Number 4  Number of Visits 7  Date for PT Re-Evaluation 10/31/23  Authorization  Authorization Type UHC  PT Time Calculation  PT Start Time 1448  PT Stop Time 1530  PT Time Calculation (min) 42 min  PT - End of Session  Equipment Utilized During Treatment Gait belt  Activity Tolerance Patient tolerated treatment well  Behavior During Therapy WFL for tasks assessed/performed    Past Medical History:  Diagnosis Date   Arthritis    Phreesia 04/05/2020   Atypical nevus 10/09/2006   slight-mderate-right upper thigh   Atypical nevus 01/24/2015   moderate-left back   Atypical nevus 02/22/2016   severe-right abdomen (WS)   Atypical nevus 02/22/2016   mild-right low abdomen (WS)   Cancer (HCC)    melanoma   Deep vein blood clot of right lower extremity (HCC)    leg   GERD (gastroesophageal reflux disease)    Heart murmur    History of migraine headaches    Hypertension    Left carotid artery stenosis    <20%   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( [facility] surgery center)   PONV (postoperative nausea and vomiting)    Psoriatic arthritis (HCC)    Rheumatoid arthritis (HCC)    Past Surgical History:  Procedure Laterality Date   ABDOMINAL HYSTERECTOMY N/A    Phreesia 04/05/2020   BREAST EXCISIONAL BIOPSY Left    CHOLECYSTECTOMY N/A 04/09/2023   Procedure: LAPAROSCOPIC CHOLECYSTECTOMY WITH ICG;  Surgeon: Andria Meuse, MD;  Location: [facility];  Service: General;  Laterality: N/A;   SKIN CANCER EXCISION     SPINE SURGERY N/A    Phreesia 04/05/2020   TOTAL VAGINAL HYSTERECTOMY     Patient Active Problem List   Diagnosis Date Noted   Benign heart murmur 05/31/2021   Vitamin  D deficiency 05/31/2021   Cervical pseudoarthrosis, sequela 11/03/2020   Neck pain 11/03/2020   Malignant melanoma of skin (HCC) 03/08/2020   Irritable bowel syndrome 03/06/2020   H/O angioedema 06/17/2019   History of rheumatoid arthritis 06/17/2019   Body mass index (BMI) 35.0-35.9, adult 05/18/2019   Rheumatoid arthritis (HCC) 03/11/2019   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/18/2018   Cervical spondylosis 08/18/2018   Chronic idiopathic constipation 01/27/2018   Family history of colon cancer 01/27/2018   GERD (gastroesophageal reflux disease) 01/27/2018   History of adenomatous polyp of colon 01/27/2018   Plantar fasciitis, left 06/24/2016   HTN (hypertension) 07/20/2013   Migraine headache 07/20/2013   Thyroid cyst 07/20/2013   Hyperlipidemia 07/20/2013   Carotid artery plaque 07/20/2013   Fatigue 04/13/2013    ONSET DATE: 08/26/2023  REFERRING DIAG: R29.6 (ICD-10-CM) - Frequent falls  THERAPY DIAG:  Dizziness and giddiness  Unsteadiness on feet  History of falling  Other abnormalities of gait and mobility  Muscle weakness (generalized)  Rationale for Evaluation and Treatment: Rehabilitation  SUBJECTIVE:  SUBJECTIVE STATEMENT: Patient denies falls and near falls since last here. Reports tandem walking still challenging but easier if moving quicker. Denies changes to medications   Pt accompanied by: self  PERTINENT HISTORY: arthritis, HTN, hx of migraines (pt reports have not had one in a while)  PAIN:  Are you having pain? No Pt has psoriatic arthritis so back hurts frequently   Vitals:   09/26/23 1453  BP: (!) 153/77  Pulse: 64    PRECAUTIONS: Fall  RED FLAGS: None   WEIGHT BEARING RESTRICTIONS: No  FALLS: Has patient fallen in last 6 months? Yes. Number  of falls at least two   LIVING ENVIRONMENT: Lives with: lives with their spouse Lives in: House/apartment Stairs: Yes: Internal: 2 full flights steps; on right going up, on left going up, and can reach both and External: 6-8 steps; on left going up Has following equipment at home: Single point cane and shower chair  PLOF: Independent  PATIENT GOALS: "Trying to get my balance"   OBJECTIVE:  Note: Objective measures were completed at Evaluation unless otherwise noted.  COGNITION: Overall cognitive status: Within functional limits for tasks assessed   SENSATION: Pt denies numbness/tingling in all extremities    POSTURE: rounded shoulders, forward head, and increased thoracic kyphosis  LOWER EXTREMITY ROM:     Active  Right Eval Left Eval  Hip flexion    Hip extension    Hip abduction    Hip adduction    Hip internal rotation    Hip external rotation    Knee flexion    Knee extension    Ankle dorsiflexion    Ankle plantarflexion    Ankle inversion    Ankle eversion     (Blank rows = not tested)  LOWER EXTREMITY MMT:  Tested in seated position  MMT Right Eval Left Eval  Hip flexion 5 5  Hip extension    Hip abduction 5 5  Hip adduction 5 5  Hip internal rotation    Hip external rotation    Knee flexion 5 5  Knee extension 5 5  Ankle dorsiflexion 5 5  Ankle plantarflexion    Ankle inversion    Ankle eversion    (Blank rows = not tested)  BED MOBILITY:  Independent per pt  TRANSFERS: Assistive device utilized: None  Sit to stand: Modified independence Stand to sit: Modified independence   GAIT: Gait pattern: step through pattern, decreased step length- Right, decreased stride length, decreased hip/knee flexion- Right, decreased ankle dorsiflexion- Right, lateral hip instability, wide BOS, and poor foot clearance- Right Distance walked: Various clinic distances  Assistive device utilized: None Level of assistance: Modified independence Comments:  Noted decreased eccentric control of RLE IC as well as decreased step clearance of RLE.   FUNCTIONAL TESTS:  MCTSIB: Condition 1: Avg of 3 trials: 30 sec, Condition 2: Avg of 3 trials: 30 sec, Condition 3: Avg of 3 trials: 30 sec, Condition 4: Avg of 3 trials: 16.22, 30 sec, and Total Score: 120/120 Noted significant A/P sway on conditions 2 & 4 (anterior > posterior)  TREATMENT:  NMR: Visual Screen: VESTIBULAR ASSESSMENT:  OCULOMOTOR EXAM: (re-screened due to mild saccades)   Ocular Alignment: normal  Ocular ROM: No Limitations  Spontaneous Nystagmus: absent  Gaze-Induced Nystagmus: absent  Smooth Pursuits: saccades - moderate increased over midline, and very mild esotropia with visual tracking   Saccades: intact  Test of Skew: very slight exotropia  Sensory Organization Test  Component  Passed  Falls  1 = Eyes Open 3/3 0  2 = Eyes Closed 3/3 0  3 = Eyes Open Background Moving 2/3 0  4 = Eye Open Floor Moving 2/3 1  5  = Eyes Closed Floor Moving 3/3 0  6 = Eyes Open Floor and Background Moving 3/3 0   Visual Below Age Matched Norms  Vestibular Above Age Matched Norms  Somatosensory Above Age Matched Norms  Composite (TOTAL SCORE: 41) Above Age Matched Norms   Self Care: educated on SOT readings as noted above, discussed pacing with tandem walking  PATIENT EDUCATION: Education details: Results of SOT Person educated: Patient Education method: Programmer, multimedia, Demonstration, Verbal cues, and Handouts Education comprehension: verbalized understanding, returned demonstration, and needs further education  HOME EXERCISE PROGRAM: Standing horizontal VOR x1 30 seconds   Access Code: [email or web address] URL: [email or web address] Date: 09/19/2023 Prepared by: Sherlie Ban  Exercises - Romberg Stance Eyes Closed on Foam Pad  - 1-2 x daily - 5 x weekly - 3 sets -  30 hold - Romberg Stance on Foam Pad with Head Rotation  - 1-2 x daily - 5 x weekly - 2 sets - 10 reps - Tandem Walking with Counter Support  - 1-2 x daily - 5 x weekly - 3 sets  GOALS: Goals reviewed with patient? Yes  SHORT TERM GOALS: Target date: 10/03/2023   Pt will be independent with initial HEP for improved vestibular input, balance and gait.  Baseline: not established on eval  Goal status: INITIAL  2.  FGA to be assessed and LTG updated  Baseline: 18/30 Goal status: MET  3.  SOT to be assessed and LTG updated  Baseline: 79 Goal status:DISCONTINUED - above age matched norms for total score   LONG TERM GOALS: Target date: 10/17/2023   Pt will improve 5 x STS to less than or equal to 18 seconds w/no BUE support to demonstrate improved functional strength and transfer efficiency.   Baseline: 22.38s no UE support  Goal status: INITIAL  2. Pt will improve FGA to at least a 22/30 in order to demo decr fall risk.  Baseline: 18/30 Goal status: INITIAL  3.  SOT goal  Baseline: 79 Goal status: DISCONTINUED - above age matched norms for total score  4.  Pt will be independent with final HEP for improved vestibular input, balance and gait.  Baseline:  Goal status: INITIAL   ASSESSMENT:  CLINICAL IMPRESSION: Emphasis of skilled physical therapy services on testing of SOT as well as oculomotor re-screen. Patient scoring above age matched norms on composite score as well as somatosensory and vestibular testing. Notably, visual score below age matched norms. Patient with minor saccade deficits noted as well during session but patient reports she is being monitored for mild cataract which may explain deficit. Will continue per POC.   OBJECTIVE IMPAIRMENTS: Abnormal gait, decreased balance, decreased mobility, difficulty walking, decreased strength, dizziness, and pain  ACTIVITY LIMITATIONS: carrying, lifting, bending, stairs, and locomotion level  PARTICIPATION  LIMITATIONS: cleaning, laundry, shopping, community activity, and yard work  PERSONAL FACTORS: Age, Fitness, and 1 comorbidity: Arthritis  are also  affecting patient's functional outcome.   REHAB POTENTIAL: Good  CLINICAL DECISION MAKING: Evolving/moderate complexity  EVALUATION COMPLEXITY: Moderate  PLAN:  PT FREQUENCY: 1x/week  PT DURATION: 6 weeks  PLANNED INTERVENTIONS: 97164- PT Re-evaluation, 97110-Therapeutic exercises, 97530- Therapeutic activity, 97112- Neuromuscular re-education, 97535- Self Care, 16109- Manual therapy, [phone]- Gait training, [phone]- Canalith repositioning, Patient/Family education, Balance training, Stair training, Dry Needling, Vestibular training, and DME instructions  PLAN FOR NEXT SESSION:  Austin Miles , EC balance, head motions. Progress VOR and vestibular system for balance, add balance exercises with visual screening and challenges (busy background as tolerated)    Carmelia Bake, PT, DPT 09/29/2023, 8:46 AM

## 2023-09-29 ENCOUNTER — Encounter: Payer: Self-pay | Admitting: Physical Therapy

## 2023-10-03 ENCOUNTER — Ambulatory Visit: Payer: 59 | Admitting: Physical Therapy

## 2023-10-03 DIAGNOSIS — M6281 Muscle weakness (generalized): Secondary | ICD-10-CM

## 2023-10-03 DIAGNOSIS — R2689 Other abnormalities of gait and mobility: Secondary | ICD-10-CM

## 2023-10-03 DIAGNOSIS — R42 Dizziness and giddiness: Secondary | ICD-10-CM

## 2023-10-03 DIAGNOSIS — R2681 Unsteadiness on feet: Secondary | ICD-10-CM

## 2023-10-03 NOTE — Therapy (Signed)
 OUTPATIENT PHYSICAL THERAPY TREATMENT   Patient Name: SEMYA KLINKE MRN: [id number] DOB:[birth year], 73 y.o., female Today's Date: 10/03/2023   PCP: Donita Brooks, MD REFERRING PROVIDER: Donita Brooks, MD  END OF SESSION:   PT End of Session - 10/03/23 0848     Visit Number 5    Number of Visits 7    Date for PT Re-Evaluation 10/31/23    Authorization Type U[employer]    PT Start Time 0847    PT Stop Time 0927    PT Time Calculation (min) 40 min    Equipment Utilized During Treatment Gait belt    Activity Tolerance Patient tolerated treatment well    Behavior During Therapy [facility] for tasks assessed/performed              Past Medical History:  Diagnosis Date   Arthritis    Phreesia 04/05/2020   Atypical nevus 10/09/2006   slight-mderate-right upper thigh   Atypical nevus 01/24/2015   moderate-left back   Atypical nevus 02/22/2016   severe-right abdomen (WS)   Atypical nevus 02/22/2016   mild-right low abdomen (WS)   Cancer (HCC)    melanoma   Deep vein blood clot of right lower extremity (HCC)    leg   GERD (gastroesophageal reflux disease)    Heart murmur    History of migraine headaches    Hypertension    Left carotid artery stenosis    <20%   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( [facility])   PONV (postoperative nausea and vomiting)    Psoriatic arthritis (HCC)    Rheumatoid arthritis (HCC)    Past Surgical History:  Procedure Laterality Date   ABDOMINAL HYSTERECTOMY N/A    Phreesia 04/05/2020   BREAST EXCISIONAL BIOPSY Left    CHOLECYSTECTOMY N/A 04/09/2023   Procedure: LAPAROSCOPIC CHOLECYSTECTOMY WITH ICG;  Surgeon: Andria Meuse, MD;  Location: [facility];  Service: General;  Laterality: N/A;   SKIN CANCER EXCISION     SPINE SURGERY N/A    Phreesia 04/05/2020   TOTAL VAGINAL HYSTERECTOMY     Patient Active Problem List   Diagnosis Date Noted   Benign heart murmur 05/31/2021   Vitamin D deficiency 05/31/2021    Cervical pseudoarthrosis, sequela 11/03/2020   Neck pain 11/03/2020   Malignant melanoma of skin (HCC) 03/08/2020   Irritable bowel syndrome 03/06/2020   H/O angioedema 06/17/2019   History of rheumatoid arthritis 06/17/2019   Body mass index (BMI) 35.0-35.9, adult 05/18/2019   Rheumatoid arthritis (HCC) 03/11/2019   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/18/2018   Cervical spondylosis 08/18/2018   Chronic idiopathic constipation 01/27/2018   Family history of colon cancer 01/27/2018   GERD (gastroesophageal reflux disease) 01/27/2018   History of adenomatous polyp of colon 01/27/2018   Plantar fasciitis, left 06/24/2016   HTN (hypertension) 07/20/2013   Migraine headache 07/20/2013   Thyroid cyst 07/20/2013   Hyperlipidemia 07/20/2013   Carotid artery plaque 07/20/2013   Fatigue 04/13/2013    ONSET DATE: 08/26/2023  REFERRING DIAG: R29.6 (ICD-10-CM) - Frequent falls  THERAPY DIAG:  Unsteadiness on feet  Dizziness and giddiness  Other abnormalities of gait and mobility  Muscle weakness (generalized)  Rationale for Evaluation and Treatment: Rehabilitation  SUBJECTIVE:  SUBJECTIVE STATEMENT: Patient denies falls and near falls since last here. Reports tandem walking is very challenging, cannot do many steps prior to feeling as though she will fall.   Pt accompanied by: self  PERTINENT HISTORY: arthritis, HTN, hx of migraines (pt reports have not had one in a while)  PAIN:  Are you having pain? No Pt has psoriatic arthritis so back hurts frequently   There were no vitals filed for this visit.   PRECAUTIONS: Fall  RED FLAGS: None   WEIGHT BEARING RESTRICTIONS: No  FALLS: Has patient fallen in last 6 months? Yes. Number of falls at least two   LIVING ENVIRONMENT: Lives  with: lives with their spouse Lives in: House/apartment Stairs: Yes: Internal: 2 full flights steps; on right going up, on left going up, and can reach both and External: 6-8 steps; on left going up Has following equipment at home: Single point cane and shower chair  PLOF: Independent  PATIENT GOALS: "Trying to get my balance"   OBJECTIVE:  Note: Objective measures were completed at Evaluation unless otherwise noted.  COGNITION: Overall cognitive status: Within functional limits for tasks assessed   SENSATION: Pt denies numbness/tingling in all extremities    POSTURE: rounded shoulders, forward head, and increased thoracic kyphosis  LOWER EXTREMITY ROM:     Active  Right Eval Left Eval  Hip flexion    Hip extension    Hip abduction    Hip adduction    Hip internal rotation    Hip external rotation    Knee flexion    Knee extension    Ankle dorsiflexion    Ankle plantarflexion    Ankle inversion    Ankle eversion     (Blank rows = not tested)  LOWER EXTREMITY MMT:  Tested in seated position  MMT Right Eval Left Eval  Hip flexion 5 5  Hip extension    Hip abduction 5 5  Hip adduction 5 5  Hip internal rotation    Hip external rotation    Knee flexion 5 5  Knee extension 5 5  Ankle dorsiflexion 5 5  Ankle plantarflexion    Ankle inversion    Ankle eversion    (Blank rows = not tested)  BED MOBILITY:  Independent per pt  TRANSFERS: Assistive device utilized: None  Sit to stand: Modified independence Stand to sit: Modified independence   GAIT: Gait pattern: step through pattern, decreased step length- Right, decreased stride length, decreased hip/knee flexion- Right, decreased ankle dorsiflexion- Right, lateral hip instability, wide BOS, and poor foot clearance- Right Distance walked: Various clinic distances  Assistive device utilized: None Level of assistance: Modified independence Comments: Noted decreased eccentric control of RLE IC as well as  decreased step clearance of RLE.   FUNCTIONAL TESTS:  MCTSIB: Condition 1: Avg of 3 trials: 30 sec, Condition 2: Avg of 3 trials: 30 sec, Condition 3: Avg of 3 trials: 30 sec, Condition 4: Avg of 3 trials: 16.22, 30 sec, and Total Score: 120/120 Noted significant A/P sway on conditions 2 & 4 (anterior > posterior)  TREATMENT: Self-care/home management/ NMR  Discussed results of SOT and pt reports she did not close her eyes on conditions 5 and 6 because she did not hear therapist.  Pt reports when she walks, she feels as though her trunk moves more quickly than her feet and she leans forward. Pt frequently catching her R foot as well. Have not noted festination in clinic but will continue to monitor.  Pt states her R ear "crackles, as if I am going up a mountain". Wants to see ENT. Encouraged pt to do this.  In // bars for improved vestibular input, ankle strategy, righting reactions, single leg stability and LE coordination. Used mirror for visual biofeedback throughout:  On rocker board in A/P direction:  Standing w/EO and no UE support. CGA-min A throughout due to anterior LOB. Cued for pt to place weight in heels and pt losing balance posteriorly.  Standing w/EC and no UE support w/min A for anterior LOB correction. After several attempts, pt able to hold for ~30s without LOB Standing w/EO w/horizontal and vertical head turns. No difficulty w/horizontal turns, but significant A/P instability w/vertical turns requiring min-mod A for stability. Pt reports due to previous cervical surgeries, she has decreased ROM of C-spine and must arch thoracic spine to look down, resulting in anterior LOB.  Alt step ups to 6" box w/no UE support, x10 reps per side. Noted LOB to L when stepping down and pt unable to perform unless looking at feet.  Progressed to holding single 2# DB in one hand to imitate  holding glass of water, as pt typically holding objects in hands when she falls. Pt performed x10 reps while holding DB and no instability noted. Progressed to 10 reps while holding two 2# DB (one in each hand) and pt much more guarded and unstable. CGA-min A throughout  Ambulated 115' around gym while holding 2# DB in each hand w/CGA. No anterior LOB noted but pt had single catching of R foot when turning to L.   STAIRS:  Level of Assistance: CGA Stair Negotiation Technique: Step to Pattern Alternating Pattern  with No Rails  Number of Stairs: 8   Height of Stairs: 6"   Comments: Had pt hold a 2# DB in each hand as if she was holding glasses of water. No instability w/ascent, but pt extremely hesitant to descend stairs while holding objects. Educated pt on not holding objects in both hands while walking or navigating stairs as she is unstable w/this. Pt verbalized agreement and understanding.     PATIENT EDUCATION: Education details: Not carrying objects in both hands w/gait or stairs, continue HEP  Person educated: Patient Education method: Medical illustrator Education comprehension: verbalized understanding and needs further education  HOME EXERCISE PROGRAM: Standing horizontal VOR x1 30 seconds   Access Code: [email or web address] URL: [email or web address] Date: 09/19/2023 Prepared by: Sherlie Ban  Exercises - Romberg Stance Eyes Closed on Foam Pad  - 1-2 x daily - 5 x weekly - 3 sets - 30 hold - Romberg Stance on Foam Pad with Head Rotation  - 1-2 x daily - 5 x weekly - 2 sets - 10 reps - Tandem Walking with Counter Support  - 1-2 x daily - 5 x weekly - 3 sets  GOALS: Goals reviewed with patient? Yes  SHORT TERM GOALS: Target date: 10/03/2023   Pt will be independent with initial HEP for improved vestibular input, balance and gait.  Baseline: not established on eval  Goal status: MET  2.  FGA to be assessed and LTG updated  Baseline: 18/30 Goal  status: MET  3.  SOT to be assessed and LTG updated  Baseline: 81 Goal status:DISCONTINUED - above age matched norms for total score   LONG TERM GOALS: Target date: 10/17/2023   Pt will improve 5 x STS to less than or equal to 18 seconds w/no BUE support to demonstrate improved functional strength and transfer efficiency.   Baseline: 22.38s no UE support  Goal status: INITIAL  2. Pt will improve FGA to at least a 22/30 in order to demo decr fall risk.  Baseline: 18/30 Goal status: INITIAL  3.  SOT goal  Baseline: 81 Goal status: DISCONTINUED - above age matched norms for total score  4.  Pt will be independent with final HEP for improved vestibular input, balance and gait.  Baseline:  Goal status: INITIAL   ASSESSMENT:  CLINICAL IMPRESSION: Emphasis of skilled physical therapy session on STG assessment, vestibular input, ankle strategy and LE coordination. Pt has met all 3 STGs, completing her balance assessments and performing her HEP independently. Pt reports she did not close her eyes on SOT assessment, so results are not accurate. Pt most challenged by vertical head turns and carrying objects in both hands, both of which have contributed to her falls at home. Educated pt on not carrying objects in both hands from now on, as pt demonstrates adequate righting reactions and can stabilize if her hands are available. Will continue per POC.   OBJECTIVE IMPAIRMENTS: Abnormal gait, decreased balance, decreased mobility, difficulty walking, decreased strength, dizziness, and pain  ACTIVITY LIMITATIONS: carrying, lifting, bending, stairs, and locomotion level  PARTICIPATION LIMITATIONS: cleaning, laundry, shopping, community activity, and yard work  PERSONAL FACTORS: Age, Fitness, and 1 comorbidity: Arthritis  are also affecting patient's functional outcome.   REHAB POTENTIAL: Good  CLINICAL DECISION MAKING: Evolving/moderate complexity  EVALUATION COMPLEXITY:  Moderate  PLAN:  PT FREQUENCY: 1x/week  PT DURATION: 6 weeks  PLANNED INTERVENTIONS: 97164- PT Re-evaluation, 97110-Therapeutic exercises, 97530- Therapeutic activity, 97112- Neuromuscular re-education, 97535- Self Care, 29562- Manual therapy, [phone]- Gait training, [phone]- Canalith repositioning, Patient/Family education, Balance training, Stair training, Dry Needling, Vestibular training, and DME instructions  PLAN FOR NEXT SESSION:  Austin Miles , EC balance, head motions. Progress VOR and vestibular system for balance, add balance exercises with visual screening and challenges (busy background as tolerated), surge walking, blaze pods on mirror/beam  Scottlynn Lindell E Louden Houseworth, PT, DPT 10/03/2023, 9:28 AM

## 2023-10-10 ENCOUNTER — Ambulatory Visit: Payer: 59 | Attending: Family Medicine | Admitting: Physical Therapy

## 2023-10-10 VITALS — BP 151/84 | HR 74

## 2023-10-10 DIAGNOSIS — Z9181 History of falling: Secondary | ICD-10-CM | POA: Insufficient documentation

## 2023-10-10 DIAGNOSIS — R2681 Unsteadiness on feet: Secondary | ICD-10-CM | POA: Diagnosis present

## 2023-10-10 DIAGNOSIS — M6281 Muscle weakness (generalized): Secondary | ICD-10-CM | POA: Insufficient documentation

## 2023-10-10 DIAGNOSIS — R2689 Other abnormalities of gait and mobility: Secondary | ICD-10-CM | POA: Diagnosis present

## 2023-10-10 DIAGNOSIS — R42 Dizziness and giddiness: Secondary | ICD-10-CM | POA: Insufficient documentation

## 2023-10-10 NOTE — Therapy (Signed)
 OUTPATIENT PHYSICAL THERAPY TREATMENT   Patient Name: Connie Cisneros MRN: [id number] DOB:07/16/1951, 73 y.o., female Today's Date: 10/10/2023   PCP: Donita Brooks, MD REFERRING PROVIDER: Donita Brooks, MD  END OF SESSION:   PT End of Session - 10/10/23 0850     Visit Number 6    Number of Visits 7    Date for PT Re-Evaluation 10/31/23    Authorization Type UHC    PT Start Time 0849    PT Stop Time 0929    PT Time Calculation (min) 40 min    Equipment Utilized During Treatment Gait belt    Activity Tolerance Patient tolerated treatment well    Behavior During Therapy [facility] for tasks assessed/performed               Past Medical History:  Diagnosis Date   Arthritis    Phreesia 04/05/2020   Atypical nevus 10/09/2006   slight-mderate-right upper thigh   Atypical nevus 01/24/2015   moderate-left back   Atypical nevus 02/22/2016   severe-right abdomen (WS)   Atypical nevus 02/22/2016   mild-right low abdomen (WS)   Cancer (HCC)    melanoma   Deep vein blood clot of right lower extremity (HCC)    leg   GERD (gastroesophageal reflux disease)    Heart murmur    History of migraine headaches    Hypertension    Left carotid artery stenosis    <20%   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( [facility])   PONV (postoperative nausea and vomiting)    Psoriatic arthritis (HCC)    Rheumatoid arthritis (HCC)    Past Surgical History:  Procedure Laterality Date   ABDOMINAL HYSTERECTOMY N/A    Phreesia 04/05/2020   BREAST EXCISIONAL BIOPSY Left    CHOLECYSTECTOMY N/A 04/09/2023   Procedure: LAPAROSCOPIC CHOLECYSTECTOMY WITH ICG;  Surgeon: Andria Meuse, MD;  Location: [facility];  Service: General;  Laterality: N/A;   SKIN CANCER EXCISION     SPINE SURGERY N/A    Phreesia 04/05/2020   TOTAL VAGINAL HYSTERECTOMY     Patient Active Problem List   Diagnosis Date Noted   Benign heart murmur 05/31/2021   Vitamin D deficiency  05/31/2021   Cervical pseudoarthrosis, sequela 11/03/2020   Neck pain 11/03/2020   Malignant melanoma of skin (HCC) 03/08/2020   Irritable bowel syndrome 03/06/2020   H/O angioedema 06/17/2019   History of rheumatoid arthritis 06/17/2019   Body mass index (BMI) 35.0-35.9, adult 05/18/2019   Rheumatoid arthritis (HCC) 03/11/2019   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/18/2018   Cervical spondylosis 08/18/2018   Chronic idiopathic constipation 01/27/2018   Family history of colon cancer 01/27/2018   GERD (gastroesophageal reflux disease) 01/27/2018   History of adenomatous polyp of colon 01/27/2018   Plantar fasciitis, left 06/24/2016   HTN (hypertension) 07/20/2013   Migraine headache 07/20/2013   Thyroid cyst 07/20/2013   Hyperlipidemia 07/20/2013   Carotid artery plaque 07/20/2013   Fatigue 04/13/2013    ONSET DATE: 08/26/2023  REFERRING DIAG: R29.6 (ICD-10-CM) - Frequent falls  THERAPY DIAG:  Unsteadiness on feet  Dizziness and giddiness  Other abnormalities of gait and mobility  Muscle weakness (generalized)  History of falling  Rationale for Evaluation and Treatment: Rehabilitation  SUBJECTIVE:  SUBJECTIVE STATEMENT: Patient denies falls and near falls since last here. Reports tandem walking is still hard but she is working on it.   Pt accompanied by: self  PERTINENT HISTORY: arthritis, HTN, hx of migraines (pt reports have not had one in a while)  PAIN:  Are you having pain? No Pt has psoriatic arthritis so back hurts frequently   Vitals:   10/10/23 0854  BP: (!) 151/84  Pulse: 74     PRECAUTIONS: Fall  RED FLAGS: None   WEIGHT BEARING RESTRICTIONS: No  FALLS: Has patient fallen in last 6 months? Yes. Number of falls at least two   LIVING  ENVIRONMENT: Lives with: lives with their spouse Lives in: House/apartment Stairs: Yes: Internal: 2 full flights steps; on right going up, on left going up, and can reach both and External: 6-8 steps; on left going up Has following equipment at home: Single point cane and shower chair  PLOF: Independent  PATIENT GOALS: "Trying to get my balance"   OBJECTIVE:  Note: Objective measures were completed at Evaluation unless otherwise noted.  COGNITION: Overall cognitive status: Within functional limits for tasks assessed   SENSATION: Pt denies numbness/tingling in all extremities    POSTURE: rounded shoulders, forward head, and increased thoracic kyphosis  LOWER EXTREMITY ROM:     Active  Right Eval Left Eval  Hip flexion    Hip extension    Hip abduction    Hip adduction    Hip internal rotation    Hip external rotation    Knee flexion    Knee extension    Ankle dorsiflexion    Ankle plantarflexion    Ankle inversion    Ankle eversion     (Blank rows = not tested)  LOWER EXTREMITY MMT:  Tested in seated position  MMT Right Eval Left Eval  Hip flexion 5 5  Hip extension    Hip abduction 5 5  Hip adduction 5 5  Hip internal rotation    Hip external rotation    Knee flexion 5 5  Knee extension 5 5  Ankle dorsiflexion 5 5  Ankle plantarflexion    Ankle inversion    Ankle eversion    (Blank rows = not tested)  BED MOBILITY:  Independent per pt  TRANSFERS: Assistive device utilized: None  Sit to stand: Modified independence Stand to sit: Modified independence   GAIT: Gait pattern: step through pattern, decreased step length- Right, decreased stride length, decreased hip/knee flexion- Right, decreased ankle dorsiflexion- Right, lateral hip instability, wide BOS, and poor foot clearance- Right Distance walked: Various clinic distances  Assistive device utilized: None Level of assistance: Modified independence Comments: Noted decreased eccentric control of  RLE IC as well as decreased step clearance of RLE.   FUNCTIONAL TESTS:  MCTSIB: Condition 1: Avg of 3 trials: 30 sec, Condition 2: Avg of 3 trials: 30 sec, Condition 3: Avg of 3 trials: 30 sec, Condition 4: Avg of 3 trials: 16.22, 30 sec, and Total Score: 120/120 Noted significant A/P sway on conditions 2 & 4 (anterior > posterior)  TREATMENT: Self-care/home management Assessed vitals (see above) and systolic elevated but within limits for session. Pt reports she did not take her meds this AM.   NMR  Walking w/surge x230' for improved postural control and reactive balance strategies. Noted lateral deviations to L and anterior instability on last 50' due to fatigue, but pt able to stop and reset independently. SBA throughout.  Dead lifts w/15# surge, x8 reps, for proper lifting technique, posterior chain strength and stability w/forward reach. Provided mod multimodal cues to facilitate hip hinge and maintain neutral spine. No anterior LOB noted, SBA throughout.  Progressed to 8 reps while standing on Airex for added stability challenge. Pt performed well if moving slowly and reported no pain in her low back. CGA for safety.   6 Blaze pods on random reach setting placed on stairway (2 pods per step) for improved LE coordination, retro stepping, postural control and single leg stability.  Performed on 2 minute intervals with 2-4 minute rest periods.  Pt requires SBA guarding and BUE support. Round 1:  two pods placed on bottom 3 steps w/cues to step up fwd and back retro to hit pods.  39 hits. Round 2:  Same setup w/added posterior resistance from therapist via orange resistance band.  38 hits. Notable errors/deficits:  RPE of 8/10 following activity. Pt unable to perform without UE support  On rocker board in L/R direction, 2kg ball tosses to rebounder for improved postural control, visual  scanning and anticipatory balance strategies. Min A throughout due to frequent LOB to R side.   PATIENT EDUCATION: Education details: continue HEP, plan to add 1-2 more visits next session  Person educated: Patient Education method: Explanation Education comprehension: verbalized understanding  HOME EXERCISE PROGRAM: Standing horizontal VOR x1 30 seconds   Access Code: [email or web address] URL: [email or web address] Date: 09/19/2023 Prepared by: Sherlie Ban  Exercises - Romberg Stance Eyes Closed on Foam Pad  - 1-2 x daily - 5 x weekly - 3 sets - 30 hold - Romberg Stance on Foam Pad with Head Rotation  - 1-2 x daily - 5 x weekly - 2 sets - 10 reps - Tandem Walking with Counter Support  - 1-2 x daily - 5 x weekly - 3 sets  GOALS: Goals reviewed with patient? Yes  SHORT TERM GOALS: Target date: 10/03/2023   Pt will be independent with initial HEP for improved vestibular input, balance and gait.  Baseline: not established on eval  Goal status: MET  2.  FGA to be assessed and LTG updated  Baseline: 18/30 Goal status: MET  3.  SOT to be assessed and LTG updated  Baseline: 79 Goal status:DISCONTINUED - above age matched norms for total score   LONG TERM GOALS: Target date: 10/17/2023   Pt will improve 5 x STS to less than or equal to 18 seconds w/no BUE support to demonstrate improved functional strength and transfer efficiency.   Baseline: 22.38s no UE support  Goal status: INITIAL  2. Pt will improve FGA to at least a 22/30 in order to demo decr fall risk.  Baseline: 18/30 Goal status: INITIAL  3.  SOT goal  Baseline: 79 Goal status: DISCONTINUED - above age matched norms for total score  4.  Pt will be independent with final HEP for improved vestibular input, balance and gait.  Baseline:  Goal status: INITIAL   ASSESSMENT:  CLINICAL IMPRESSION: Emphasis of skilled physical therapy session on proper lifting technique, reactive and anticipatory  balance and postural control. Pt tolerated  session well and continues to demonstrate poor postural control if holding unsteady objects or moving too quickly. Pt reports she has learned a lot in PT and would like to add a few more visits to POC next session. Will continue per POC.   OBJECTIVE IMPAIRMENTS: Abnormal gait, decreased balance, decreased mobility, difficulty walking, decreased strength, dizziness, and pain  ACTIVITY LIMITATIONS: carrying, lifting, bending, stairs, and locomotion level  PARTICIPATION LIMITATIONS: cleaning, laundry, shopping, community activity, and yard work  PERSONAL FACTORS: Age, Fitness, and 1 comorbidity: Arthritis  are also affecting patient's functional outcome.   REHAB POTENTIAL: Good  CLINICAL DECISION MAKING: Evolving/moderate complexity  EVALUATION COMPLEXITY: Moderate  PLAN:  PT FREQUENCY: 1x/week  PT DURATION: 6 weeks  PLANNED INTERVENTIONS: 97164- PT Re-evaluation, 97110-Therapeutic exercises, 97530- Therapeutic activity, 97112- Neuromuscular re-education, 97535- Self Care, 86578- Manual therapy, [phone]- Gait training, [phone]- Canalith repositioning, Patient/Family education, Balance training, Stair training, Dry Needling, Vestibular training, and DME instructions  PLAN FOR NEXT SESSION: Check goals and recert to add 1-2 more visits.   Austin Miles , EC balance, head motions. Progress VOR and vestibular system for balance, add balance exercises with visual screening and challenges (busy background as tolerated), surge walking, blaze pods on mirror/beam  Theresia Pree E Trinia Georgi, PT, DPT 10/10/2023, 9:36 AM

## 2023-10-17 ENCOUNTER — Ambulatory Visit: Payer: 59 | Admitting: Physical Therapy

## 2023-10-17 ENCOUNTER — Encounter: Payer: Self-pay | Admitting: Physical Therapy

## 2023-10-17 VITALS — BP 141/89 | HR 77

## 2023-10-17 DIAGNOSIS — M6281 Muscle weakness (generalized): Secondary | ICD-10-CM

## 2023-10-17 DIAGNOSIS — R42 Dizziness and giddiness: Secondary | ICD-10-CM

## 2023-10-17 DIAGNOSIS — R2689 Other abnormalities of gait and mobility: Secondary | ICD-10-CM

## 2023-10-17 DIAGNOSIS — Z9181 History of falling: Secondary | ICD-10-CM

## 2023-10-17 DIAGNOSIS — R2681 Unsteadiness on feet: Secondary | ICD-10-CM

## 2023-10-17 NOTE — Therapy (Signed)
 OUTPATIENT PHYSICAL THERAPY TREATMENT/RE-CERT   Patient Name: Connie Cisneros  MRN: [id number] DOB:1950/12/28, 73 y.o., female Today's Date: 10/17/2023   PCP: Donita Brooks, MD REFERRING PROVIDER: Donita Brooks, MD  END OF SESSION:   PT End of Session - 10/17/23 0847     Visit Number 7    Number of Visits 11    Date for PT Re-Evaluation 11/16/23   per re-cert on 1/91/47   Authorization Type [employer]    PT Start Time 0846    PT Stop Time 0925    PT Time Calculation (min) 39 min    Equipment Utilized During Treatment Gait belt    Activity Tolerance Patient tolerated treatment well    Behavior During Therapy [facility] for tasks assessed/performed               Past Medical History:  Diagnosis Date   Arthritis    Phreesia 04/05/2020   Atypical nevus 10/09/2006   slight-mderate-right upper thigh   Atypical nevus 01/24/2015   moderate-left back   Atypical nevus 02/22/2016   severe-right abdomen (WS)   Atypical nevus 02/22/2016   mild-right low abdomen (WS)   Cancer (HCC)    melanoma   Deep vein blood clot of right lower extremity (HCC)    leg   GERD (gastroesophageal reflux disease)    Heart murmur    History of migraine headaches    Hypertension    Left carotid artery stenosis    <20%   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( [facility])   PONV (postoperative nausea and vomiting)    Psoriatic arthritis (HCC)    Rheumatoid arthritis (HCC)    Past Surgical History:  Procedure Laterality Date   ABDOMINAL HYSTERECTOMY N/A    Phreesia 04/05/2020   BREAST EXCISIONAL BIOPSY Left    CHOLECYSTECTOMY N/A 04/09/2023   Procedure: LAPAROSCOPIC CHOLECYSTECTOMY WITH ICG;  Surgeon: Andria Meuse, MD;  Location: [facility];  Service: General;  Laterality: N/A;   SKIN CANCER EXCISION     SPINE SURGERY N/A    Phreesia 04/05/2020   TOTAL VAGINAL HYSTERECTOMY     Patient Active Problem List   Diagnosis Date Noted   Benign heart murmur 05/31/2021    Vitamin D deficiency 05/31/2021   Cervical pseudoarthrosis, sequela 11/03/2020   Neck pain 11/03/2020   Malignant melanoma of skin (HCC) 03/08/2020   Irritable bowel syndrome 03/06/2020   H/O angioedema 06/17/2019   History of rheumatoid arthritis 06/17/2019   Body mass index (BMI) 35.0-35.9, adult 05/18/2019   Rheumatoid arthritis (HCC) 03/11/2019   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/18/2018   Cervical spondylosis 08/18/2018   Chronic idiopathic constipation 01/27/2018   Family history of colon cancer 01/27/2018   GERD (gastroesophageal reflux disease) 01/27/2018   History of adenomatous polyp of colon 01/27/2018   Plantar fasciitis, left 06/24/2016   HTN (hypertension) 07/20/2013   Migraine headache 07/20/2013   Thyroid cyst 07/20/2013   Hyperlipidemia 07/20/2013   Carotid artery plaque 07/20/2013   Fatigue 04/13/2013    ONSET DATE: 08/26/2023  REFERRING DIAG: R29.6 (ICD-10-CM) - Frequent falls  THERAPY DIAG:  Unsteadiness on feet  Dizziness and giddiness  Other abnormalities of gait and mobility  History of falling  Muscle weakness (generalized)  Rationale for Evaluation and Treatment: Rehabilitation  SUBJECTIVE:  SUBJECTIVE STATEMENT: No falls. Reports balance is getting a little better. Notes heel toe is still challenging. Notes closing her eyes or turning too quickly will make her woozy.   Pt accompanied by: self  PERTINENT HISTORY: arthritis, HTN, hx of migraines (pt reports have not had one in a while)  PAIN:  Are you having pain? No Pt has psoriatic arthritis so back hurts frequently   Vitals:   10/17/23 0851  BP: (!) 141/89  Pulse: 77      PRECAUTIONS: Fall  RED FLAGS: None   WEIGHT BEARING RESTRICTIONS: No  FALLS: Has patient fallen in last 6  months? Yes. Number of falls at least two   LIVING ENVIRONMENT: Lives with: lives with their spouse Lives in: House/apartment Stairs: Yes: Internal: 2 full flights steps; on right going up, on left going up, and can reach both and External: 6-8 steps; on left going up Has following equipment at home: Single point cane and shower chair  PLOF: Independent  PATIENT GOALS: "Trying to get my balance"   OBJECTIVE:  Note: Objective measures were completed at Evaluation unless otherwise noted.  COGNITION: Overall cognitive status: Within functional limits for tasks assessed   SENSATION: Pt denies numbness/tingling in all extremities    POSTURE: rounded shoulders, forward head, and increased thoracic kyphosis  LOWER EXTREMITY ROM:     Active  Right Eval Left Eval  Hip flexion    Hip extension    Hip abduction    Hip adduction    Hip internal rotation    Hip external rotation    Knee flexion    Knee extension    Ankle dorsiflexion    Ankle plantarflexion    Ankle inversion    Ankle eversion     (Blank rows = not tested)  LOWER EXTREMITY MMT:  Tested in seated position  MMT Right Eval Left Eval  Hip flexion 5 5  Hip extension    Hip abduction 5 5  Hip adduction 5 5  Hip internal rotation    Hip external rotation    Knee flexion 5 5  Knee extension 5 5  Ankle dorsiflexion 5 5  Ankle plantarflexion    Ankle inversion    Ankle eversion    (Blank rows = not tested)  BED MOBILITY:  Independent per pt  TRANSFERS: Assistive device utilized: None  Sit to stand: Modified independence Stand to sit: Modified independence   GAIT: Gait pattern: step through pattern, decreased step length- Right, decreased stride length, decreased hip/knee flexion- Right, decreased ankle dorsiflexion- Right, lateral hip instability, wide BOS, and poor foot clearance- Right Distance walked: Various clinic distances  Assistive device utilized: None Level of assistance: Modified  independence Comments: Noted decreased eccentric control of RLE IC as well as decreased step clearance of RLE.   FUNCTIONAL TESTS:  MCTSIB: Condition 1: Avg of 3 trials: 30 sec, Condition 2: Avg of 3 trials: 30 sec, Condition 3: Avg of 3 trials: 30 sec, Condition 4: Avg of 3 trials: 16.22, 30 sec, and Total Score: 120/120 Noted significant A/P sway on conditions 2 & 4 (anterior > posterior)  TREATMENT: Therapeutic Activity: Vitals:   10/17/23 0851  BP: (!) 141/89  Pulse: 77     Goal Assessment: 5x sit <> stand: 12.3 seconds with no UE support    [facility] PT Assessment - 10/17/23 0853       Functional Gait  Assessment   Gait assessed  Yes    Gait Level Surface Walks 20 ft in less than 7 sec but greater than 5.5 sec, uses assistive device, slower speed, mild gait deviations, or deviates 6-10 in outside of the 12 in walkway width.   6.1   Change in Gait Speed Able to smoothly change walking speed without loss of balance or gait deviation. Deviate no more than 6 in outside of the 12 in walkway width.    Gait with Horizontal Head Turns Performs head turns smoothly with slight change in gait velocity (eg, minor disruption to smooth gait path), deviates 6-10 in outside 12 in walkway width, or uses an assistive device.    Gait with Vertical Head Turns Performs task with slight change in gait velocity (eg, minor disruption to smooth gait path), deviates 6 - 10 in outside 12 in walkway width or uses assistive device    Gait and Pivot Turn Pivot turns safely within 3 sec and stops quickly with no loss of balance.    Step Over Obstacle Is able to step over 2 stacked shoe boxes taped together (9 in total height) without changing gait speed. No evidence of imbalance.    Gait with Narrow Base of Support Ambulates 4-7 steps.    Gait with Eyes Closed Walks 20 ft, uses assistive device, slower  speed, mild gait deviations, deviates 6-10 in outside 12 in walkway width. Ambulates 20 ft in less than 9 sec but greater than 7 sec.   8 seconds   Ambulating Backwards Walks 20 ft, uses assistive device, slower speed, mild gait deviations, deviates 6-10 in outside 12 in walkway width.   16.6 seconds   Steps Alternating feet, must use rail.    Total Score 22    FGA comment: 22/30 = Moderate Fall Risk              Dynamic Visual Acuity: Static: Line 10 Dynamic: Line 7 3 line difference, pt reports feeling woozy afterwards and a little unsteady   NMR:   Reviewed/updated HEP below:   Gaze Adaptation: x1 Viewing Horizontal: Position: Standing with busy background, Time: 30 seconds, Reps: 3, and Comment: Cues to just move head   Access Code: [email or web address] URL: [email or web address] Date: 10/17/2023 Prepared by: Sherlie Ban  Exercises - Romberg Stance Eyes Closed on Foam Pad  - 1-2 x daily - 5 x weekly - 3 sets - 30 hold - Romberg Stance on Foam Pad with Head Rotation  - 1-2 x daily - 5 x weekly - 2 sets - 10 reps - Tandem Walking with Counter Support  - 1-2 x daily - 5 x weekly - 3 sets - Walking with Head Rotation  - 1 x daily - 5 x weekly - 3 sets - also gait with head nods in the hallway, pt more unsteady with head nods    PATIENT EDUCATION: Education details: continue HEP - progressed VOR to a busy background and added gait with head motions to HEP, will continue PT for an additional 1x week for 4 weeks, discussed areas PT will continue to address  Person educated: Patient Education method: Explanation, Demonstration, Verbal cues, and Handouts Education comprehension: verbalized understanding and returned demonstration  HOME EXERCISE PROGRAM: Standing horizontal VOR x1 30 seconds - progressed to busy background   Access Code: [email or web address] URL: [email or web address] Date: 10/17/2023 Prepared by: Sherlie Ban  Exercises - Romberg Stance Eyes  Closed on Foam Pad  - 1-2 x daily - 5 x weekly - 3 sets - 30 hold - Romberg Stance on Foam Pad with Head Rotation  - 1-2 x daily - 5 x weekly - 2 sets - 10 reps - Tandem Walking with Counter Support  - 1-2 x daily - 5 x weekly - 3 sets - Walking with Head Rotation  - 1 x daily - 5 x weekly - 3 sets  GOALS: Goals reviewed with patient? Yes  SHORT TERM GOALS: Target date: 10/03/2023   Pt will be independent with initial HEP for improved vestibular input, balance and gait.  Baseline: not established on eval  Goal status: MET  2.  FGA to be assessed and LTG updated  Baseline: 18/30 Goal status: MET  3.  SOT to be assessed and LTG updated  Baseline: 81 Goal status:DISCONTINUED - above age matched norms for total score   LONG TERM GOALS: Target date: 10/17/2023   Pt will improve 5 x STS to less than or equal to 18 seconds w/no BUE support to demonstrate improved functional strength and transfer efficiency.   Baseline: 22.38s no UE support   12.3 seconds with no UE support  Goal status: MET  2. Pt will improve FGA to at least a 22/30 in order to demo decr fall risk.  Baseline: 18/30  22/30  Goal status:MET  3.  SOT goal  Baseline: 79 Goal status: DISCONTINUED - pt did not have her eyes closed on conditions 5/6, not accurate   4.  Pt will be independent with final HEP for improved vestibular input, balance and gait.  Baseline:  Goal status:ON-GOING    UPDATED/ONGOING LTGS FOR RE-CERT:  LONG TERM GOALS:Target date: 11/14/2023  1. Pt will improve FGA to at least a 24/30 in order to demo decr fall risk.  Baseline: 18/30  22/30  Goal status: REVISED  3.  Pt will perform DVA in 2 line difference or less in order to demo improved VOR for vestibular input  Baseline: 3 line difference and wooziness Goal status: NEW  4.  Pt will be independent with final HEP for improved vestibular input, balance and gait.  Baseline:  Goal status:ON-GOING   ASSESSMENT:  CLINICAL  IMPRESSION: Today's skilled session focused on assessing LTGs.  Pt met LTGs #1 and #2 in regards to 5x sit <> stand and FGA. Pt significantly improved 5x sit <> stand from 22.38 seconds to 12.3 seconds with no UE support. Pt improved FGA to 22/30, indicating a moderate fall risk. Pt still most challenged by gait with head motions and tandem gait. LTG #3 was discontinued (SOT score inaccurate due to pt having eyes open on conditions 5 and 6). LTG #4 is on-going for HEP. Re-assessed DVA with pt with a 3 line difference and wooziness, indicating impaired VOR. Will re-cert for an additional 1x week for 4 weeks to continue to address balance, vestibular system, gait in order to der fall risk. LTGs updated/revised as appropriate.   OBJECTIVE IMPAIRMENTS: Abnormal gait, decreased balance, decreased mobility, difficulty walking, decreased strength, dizziness, and pain  ACTIVITY LIMITATIONS: carrying, lifting, bending, stairs, and locomotion level  PARTICIPATION LIMITATIONS: cleaning, laundry, shopping, community activity, and yard work  PERSONAL FACTORS: Age, Fitness, and 1 comorbidity: Arthritis  are also affecting patient's functional  outcome.   REHAB POTENTIAL: Good  CLINICAL DECISION MAKING: Evolving/moderate complexity  EVALUATION COMPLEXITY: Moderate  PLAN:  PT FREQUENCY: 1x/week  PT DURATION: 4 weeks  PLANNED INTERVENTIONS: 97164- PT Re-evaluation, 97110-Therapeutic exercises, 97530- Therapeutic activity, [id number]- Neuromuscular re-education, 97535- Self Care, [id number]- Manual therapy, [id number]- Gait training, [phone]- Canalith repositioning, Patient/Family education, Balance training, Stair training, Dry Needling, Vestibular training, and DME instructions  PLAN FOR NEXT SESSION:EC balance, head motion, unlevel surfaces. Progress VOR and vestibular system for balance, add balance exercises with visual screening and challenges (busy background as tolerated), surge walking, blaze pods on  mirror/beam  Drake Leach, PT, DPT 10/17/2023, 9:31 AM

## 2023-10-24 ENCOUNTER — Ambulatory Visit: Admitting: Family Medicine

## 2023-10-24 ENCOUNTER — Ambulatory Visit: Admitting: Physical Therapy

## 2023-10-24 ENCOUNTER — Encounter: Payer: Self-pay | Admitting: Family Medicine

## 2023-10-24 ENCOUNTER — Encounter: Payer: Self-pay | Admitting: Physical Therapy

## 2023-10-24 ENCOUNTER — Ambulatory Visit: Payer: 59

## 2023-10-24 VITALS — BP 158/83 | HR 83

## 2023-10-24 VITALS — BP 132/82 | HR 80 | Temp 98.0°F | Resp 97 | Ht 63.5 in | Wt 209.6 lb

## 2023-10-24 DIAGNOSIS — J329 Chronic sinusitis, unspecified: Secondary | ICD-10-CM | POA: Diagnosis not present

## 2023-10-24 DIAGNOSIS — R2689 Other abnormalities of gait and mobility: Secondary | ICD-10-CM

## 2023-10-24 DIAGNOSIS — H6993 Unspecified Eustachian tube disorder, bilateral: Secondary | ICD-10-CM

## 2023-10-24 DIAGNOSIS — R42 Dizziness and giddiness: Secondary | ICD-10-CM

## 2023-10-24 DIAGNOSIS — R2681 Unsteadiness on feet: Secondary | ICD-10-CM

## 2023-10-24 MED ORDER — FLUTICASONE PROPIONATE 50 MCG/ACT NA SUSP: 2.0000 | Freq: Every day | NASAL | 6 refills | Status: DC

## 2023-10-24 MED ORDER — AMOXICILLIN-POT CLAVULANATE 875-125 MG PO TABS: 1.0000 | ORAL_TABLET | Freq: Two times a day (BID) | ORAL | 0 refills | Status: DC

## 2023-10-24 MED ORDER — LEVOCETIRIZINE DIHYDROCHLORIDE 5 MG PO TABS: 5.0000 mg | ORAL_TABLET | Freq: Every evening | ORAL | 11 refills | Status: DC

## 2023-10-24 NOTE — Therapy (Signed)
 OUTPATIENT PHYSICAL THERAPY TREATMENT  Patient Name: KRISTINA MCNORTON MRN: [id number] DOB:[birth year], 73 y.o., female Today's Date: 10/24/2023   PCP: Donita Brooks, MD REFERRING PROVIDER: Donita Brooks, MD  END OF SESSION:   PT End of Session - 10/24/23 0851     Visit Number 8    Number of Visits 11    Date for PT Re-Evaluation 11/16/23   per re-cert on 11/12/79   Authorization Type [employer]    PT Start Time 0850    PT Stop Time 0930    PT Time Calculation (min) 40 min    Equipment Utilized During Treatment Gait belt    Activity Tolerance Patient tolerated treatment well    Behavior During Therapy [facility] for tasks assessed/performed               Past Medical History:  Diagnosis Date   Arthritis    Phreesia 04/05/2020   Atypical nevus 10/09/2006   slight-mderate-right upper thigh   Atypical nevus 01/24/2015   moderate-left back   Atypical nevus 02/22/2016   severe-right abdomen (WS)   Atypical nevus 02/22/2016   mild-right low abdomen (WS)   Cancer (HCC)    melanoma   Deep vein blood clot of right lower extremity (HCC)    leg   GERD (gastroesophageal reflux disease)    Heart murmur    History of migraine headaches    Hypertension    Left carotid artery stenosis    <20%   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( [facility])   PONV (postoperative nausea and vomiting)    Psoriatic arthritis (HCC)    Rheumatoid arthritis (HCC)    Past Surgical History:  Procedure Laterality Date   ABDOMINAL HYSTERECTOMY N/A    Phreesia 04/05/2020   BREAST EXCISIONAL BIOPSY Left    CHOLECYSTECTOMY N/A 04/09/2023   Procedure: LAPAROSCOPIC CHOLECYSTECTOMY WITH ICG;  Surgeon: Andria Meuse, MD;  Location: [facility];  Service: General;  Laterality: N/A;   SKIN CANCER EXCISION     SPINE SURGERY N/A    Phreesia 04/05/2020   TOTAL VAGINAL HYSTERECTOMY     Patient Active Problem List   Diagnosis Date Noted   Benign heart murmur 05/31/2021    Vitamin D deficiency 05/31/2021   Cervical pseudoarthrosis, sequela 11/03/2020   Neck pain 11/03/2020   Malignant melanoma of skin (HCC) 03/08/2020   Irritable bowel syndrome 03/06/2020   H/O angioedema 06/17/2019   History of rheumatoid arthritis 06/17/2019   Body mass index (BMI) 35.0-35.9, adult 05/18/2019   Rheumatoid arthritis (HCC) 03/11/2019   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/18/2018   Cervical spondylosis 08/18/2018   Chronic idiopathic constipation 01/27/2018   Family history of colon cancer 01/27/2018   GERD (gastroesophageal reflux disease) 01/27/2018   History of adenomatous polyp of colon 01/27/2018   Plantar fasciitis, left 06/24/2016   HTN (hypertension) 07/20/2013   Migraine headache 07/20/2013   Thyroid cyst 07/20/2013   Hyperlipidemia 07/20/2013   Carotid artery plaque 07/20/2013   Fatigue 04/13/2013    ONSET DATE: 08/26/2023  REFERRING DIAG: R29.6 (ICD-10-CM) - Frequent falls  THERAPY DIAG:  Unsteadiness on feet  Dizziness and giddiness  Other abnormalities of gait and mobility  Rationale for Evaluation and Treatment: Rehabilitation  SUBJECTIVE:  SUBJECTIVE STATEMENT: Reports having a rheumatoid flare up. No falls. Still having difficulty with heel toe walking. Sees her PCP later today, going to ask to a referral to an ENT, feels like ear will feel clogged like going up a mountain.    Pt accompanied by: self  PERTINENT HISTORY: arthritis, HTN, hx of migraines (pt reports have not had one in a while)  PAIN:  Are you having pain? No Joint pain from rheumatoid arthritis.   Vitals:   10/24/23 0854  BP: (!) 158/83  Pulse: 83     PRECAUTIONS: Fall  RED FLAGS: None   WEIGHT BEARING RESTRICTIONS: No  FALLS: Has patient fallen in last 6 months? Yes.  Number of falls at least two   LIVING ENVIRONMENT: Lives with: lives with their spouse Lives in: House/apartment Stairs: Yes: Internal: 2 full flights steps; on right going up, on left going up, and can reach both and External: 6-8 steps; on left going up Has following equipment at home: Single point cane and shower chair  PLOF: Independent  PATIENT GOALS: "Trying to get my balance"   OBJECTIVE:  Note: Objective measures were completed at Evaluation unless otherwise noted.  COGNITION: Overall cognitive status: Within functional limits for tasks assessed   SENSATION: Pt denies numbness/tingling in all extremities    POSTURE: rounded shoulders, forward head, and increased thoracic kyphosis  LOWER EXTREMITY ROM:     Active  Right Eval Left Eval  Hip flexion    Hip extension    Hip abduction    Hip adduction    Hip internal rotation    Hip external rotation    Knee flexion    Knee extension    Ankle dorsiflexion    Ankle plantarflexion    Ankle inversion    Ankle eversion     (Blank rows = not tested)  LOWER EXTREMITY MMT:  Tested in seated position  MMT Right Eval Left Eval  Hip flexion 5 5  Hip extension    Hip abduction 5 5  Hip adduction 5 5  Hip internal rotation    Hip external rotation    Knee flexion 5 5  Knee extension 5 5  Ankle dorsiflexion 5 5  Ankle plantarflexion    Ankle inversion    Ankle eversion    (Blank rows = not tested)  BED MOBILITY:  Independent per pt  TRANSFERS: Assistive device utilized: None  Sit to stand: Modified independence Stand to sit: Modified independence   GAIT: Gait pattern: step through pattern, decreased step length- Right, decreased stride length, decreased hip/knee flexion- Right, decreased ankle dorsiflexion- Right, lateral hip instability, wide BOS, and poor foot clearance- Right Distance walked: Various clinic distances  Assistive device utilized: None Level of assistance: Modified  independence Comments: Noted decreased eccentric control of RLE IC as well as decreased step clearance of RLE.   FUNCTIONAL TESTS:  MCTSIB: Condition 1: Avg of 3 trials: 30 sec, Condition 2: Avg of 3 trials: 30 sec, Condition 3: Avg of 3 trials: 30 sec, Condition 4: Avg of 3 trials: 16.22, 30 sec, and Total Score: 120/120 Noted significant A/P sway on conditions 2 & 4 (anterior > posterior)  TREATMENT:  Vitals:   10/24/23 0854  BP: (!) 158/83  Pulse: 83     NMR:  On air ex: 5 reps sit <> stands with EO, 5 reps with EC Holding ball and making CW/CCW circles for improved gaze stabilization, 2 x 10 reps beginning with feet apart > together  Feet together EC 30 seconds Feet hip width 2 x 10 reps head turns, 2 x 10 reps head nods, pt more challenged with balance with head nods  On blue mat:  Forward/retro gait with EC down and back x3 reps, forward/retro marching with EC down and back x3 reps  On blue foam beam: Tandem gait down and back x4 reps with intermittent taps to bar for balance Side stepping with cone taps with each foot for SLS, down and back x4 reps, CGA/min A for balance due to LOB posteriorly  Gait with head turns to R/L naming cards, cued to incr gait speed as able, performed 345', pt with no LOB, intermittent cues for incr cervical AROM     PATIENT EDUCATION: Education details: continue HEP Person educated: Patient Education method: Explanation, Demonstration, and Verbal cues Education comprehension: verbalized understanding and returned demonstration  HOME EXERCISE PROGRAM: Standing horizontal VOR x1 30 seconds - progressed to busy background   Access Code: [email or web address] URL: [email or web address] Date: 10/17/2023 Prepared by: Sherlie Ban  Exercises - Romberg Stance Eyes Closed on Foam Pad  - 1-2 x daily - 5 x weekly - 3 sets - 30 hold -  Romberg Stance on Foam Pad with Head Rotation  - 1-2 x daily - 5 x weekly - 2 sets - 10 reps - Tandem Walking with Counter Support  - 1-2 x daily - 5 x weekly - 3 sets - Walking with Head Rotation  - 1 x daily - 5 x weekly - 3 sets  GOALS: Goals reviewed with patient? Yes   LONG TERM GOALS: Target date: 10/17/2023   Pt will improve 5 x STS to less than or equal to 18 seconds w/no BUE support to demonstrate improved functional strength and transfer efficiency.   Baseline: 22.38s no UE support   12.3 seconds with no UE support  Goal status: MET  2. Pt will improve FGA to at least a 22/30 in order to demo decr fall risk.  Baseline: 18/30  22/30  Goal status:MET  3.  SOT goal  Baseline: 79 Goal status: DISCONTINUED - pt did not have her eyes closed on conditions 5/6, not accurate   4.  Pt will be independent with final HEP for improved vestibular input, balance and gait.  Baseline:  Goal status:ON-GOING   UPDATED/ONGOING LTGS FOR RE-CERT:  LONG TERM GOALS:Target date: 11/14/2023  1. Pt will improve FGA to at least a 24/30 in order to demo decr fall risk.  Baseline: 18/30  22/30  Goal status: REVISED  3.  Pt will perform DVA in 2 line difference or less in order to demo improved VOR for vestibular input  Baseline: 3 line difference and wooziness Goal status: NEW  4.  Pt will be independent with final HEP for improved vestibular input, balance and gait.  Baseline:  Goal status:ON-GOING   ASSESSMENT:  CLINICAL IMPRESSION: Today's skilled session focused on balance strategies with incr vestibular input, gaze stabilization, and compliant surfaces. Pt most challenged with EC when performing head nods, pt losing balance more anteriorly, pt did improve with incr reps. Compared to last session, pt more steady with gait with head motions. Will continue per  POC.   OBJECTIVE IMPAIRMENTS: Abnormal gait, decreased balance, decreased mobility, difficulty walking, decreased  strength, dizziness, and pain  ACTIVITY LIMITATIONS: carrying, lifting, bending, stairs, and locomotion level  PARTICIPATION LIMITATIONS: cleaning, laundry, shopping, community activity, and yard work  PERSONAL FACTORS: Age, Fitness, and 1 comorbidity: Arthritis  are also affecting patient's functional outcome.   REHAB POTENTIAL: Good  CLINICAL DECISION MAKING: Evolving/moderate complexity  EVALUATION COMPLEXITY: Moderate  PLAN:  PT FREQUENCY: 1x/week  PT DURATION: 4 weeks  PLANNED INTERVENTIONS: 97164- PT Re-evaluation, 97110-Therapeutic exercises, 97530- Therapeutic activity, [id number]- Neuromuscular re-education, 97535- Self Care, [id number]- Manual therapy, [id number]- Gait training, [phone]- Canalith repositioning, Patient/Family education, Balance training, Stair training, Dry Needling, Vestibular training, and DME instructions  PLAN FOR NEXT SESSION:EC balance, head motion, unlevel surfaces. Progress VOR and vestibular system for balance, add balance exercises with visual screening and challenges (busy background as tolerated), surge walking, blaze pods on mirror/beam  Tarryn Bogdan Heloise Beecham, PT, DPT 10/24/2023, 9:31 AM

## 2023-10-24 NOTE — Progress Notes (Signed)
 Subjective:    Patient ID: Connie Cisneros, female    DOB: 1950-09-08, 73 y.o.   MRN: [id number]  Sinus Problem  Patient reports a sinus problem.  She states that she is having postnasal drip, and drainage coughing almost on a daily basis.  Her ears are popping and cracking and feel full.  She denies any pain in her sinuses.  Specifically she denies any pain at the forehead or in her cheekbone.  She denies any headache.  She does report nasal congestion.  She reports symptoms have been present off and on since January.  She was originally tried on amoxicillin but symptoms did not improve.  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( [facility] surgery center)   PONV (postoperative nausea and vomiting)    Psoriatic arthritis (HCC)    Rheumatoid arthritis (HCC)    Past Surgical History:  Procedure Laterality Date   ABDOMINAL HYSTERECTOMY N/A    Phreesia 04/05/2020   BREAST EXCISIONAL BIOPSY Left    CHOLECYSTECTOMY N/A 04/09/2023   Procedure: LAPAROSCOPIC CHOLECYSTECTOMY WITH ICG;  Surgeon: Andria Meuse, MD;  Location: [facility];  Service: General;  Laterality: N/A;   SKIN CANCER EXCISION     SPINE SURGERY N/A    Phreesia 04/05/2020   TOTAL VAGINAL HYSTERECTOMY     Current Outpatient Medications on File Prior to Visit  Medication Sig Dispense Refill   atorvastatin  (LIPITOR) 40 MG tablet TAKE 1 TABLET(40 MG) BY MOUTH DAILY AT 6 PM 90 tablet 3   buPROPion (WELLBUTRIN XL) 150 MG 24 hr tablet Take 150 mg by mouth daily.     carvedilol (COREG) 6.25 MG tablet TAKE 1 TABLET(6.25 MG) BY MOUTH TWICE DAILY WITH A MEAL 90 tablet 3   cetirizine (ZYRTEC) 10 MG tablet Take 10 mg by mouth daily.     cholestyramine (QUESTRAN) 4 g packet Take 1 packet (4 g total) by mouth 2 (two) times daily. 60 each 12   colestipol (COLESTID) 1 g tablet Take 2 g by mouth 2 (two) times daily.     estradiol (ESTRACE) 1 MG tablet Take 0.5 mg by mouth daily.     fluticasone (FLONASE) 50 MCG/ACT nasal spray Place 2 sprays into both nostrils daily. (Patient taking differently: Place 2 sprays into both nostrils daily as needed for allergies.) 16 g 6   hydrochlorothiazide (MICROZIDE) 12.5 MG capsule TAKE 1 CAPSULE(12.5 MG) BY MOUTH DAILY 90 capsule 3   ibuprofen (ADVIL) 200 MG tablet Take 400 mg by mouth every 6 (six) hours as needed for moderate pain.     omeprazole (PRILOSEC) 20 MG capsule Take 1 capsule (20 mg total) by mouth daily. 90 capsule 3   RESTASIS 0.05 % ophthalmic emulsion Place 1 drop into both eyes every other day.  No current facility-administered medications on file prior to visit.   Allergies  Allergen Reactions   Losartan     Unclear if it was losartan or verapamil that caused angioedema, both are stopped   Verapamil     Stopped by urgent care in 03/22/2016 for possible angioedema, losartan was stopped on 03/25/16 by cardiology as it is a more likely culprit   Codeine Nausea Only   Lisinopril Other (See Comments)    Sleepy and tired   Sulfa Antibiotics Nausea Only   Wellbutrin [Bupropion] Other (See Comments)    Reaction to losartan and verapamil   Social History   Socioeconomic History   Marital status: Married    Spouse name: Not on file   Number of children: Not on file   Years of education: Not on file   Highest education level: Not on file  Occupational  History   Not on file  Tobacco Use   Smoking status: Never   Smokeless tobacco: Never  Vaping Use   Vaping status: Never Used  Substance and Sexual Activity   Alcohol use: No   Drug use: Never   Sexual activity: Not on file  Other Topics Concern   Not on file  Social History Narrative   Not on file   Social Drivers of Health   Financial Resource Strain: Not on file  Food Insecurity: Not on file  Transportation Needs: Not on file  Physical Activity: Not on file  Stress: Not on file  Social Connections: Not on file  Intimate Partner Violence: Not on file     Review of Systems  All other systems reviewed and are negative.      Objective:   Physical Exam Vitals reviewed.  Constitutional:      General: She is not in acute distress.    Appearance: Normal appearance. She is well-developed. She is not ill-appearing, toxic-appearing or diaphoretic.  HENT:     Right Ear: Tympanic membrane, ear canal and external ear normal.     Left Ear: Tympanic membrane, ear canal and external ear normal.     Nose: Mucosal edema and congestion present. No rhinorrhea.     Right Sinus: No maxillary sinus tenderness or frontal sinus tenderness.     Left Sinus: No maxillary sinus tenderness or frontal sinus tenderness.     Mouth/Throat:     Pharynx: Oropharynx is clear. No oropharyngeal exudate or posterior oropharyngeal erythema.  Eyes:     General:        Right eye: No discharge.        Left eye: No discharge.     Conjunctiva/sclera: Conjunctivae normal.  Cardiovascular:     Rate and Rhythm: Normal rate and regular rhythm.     Heart sounds: Normal heart sounds.  Pulmonary:     Effort: Pulmonary effort is normal. No respiratory distress.     Breath sounds: Normal breath sounds. No wheezing or rales.  Chest:     Chest wall: No tenderness.  Neurological:     Mental Status: She is alert.           Assessment & Plan:  Dysfunction of both eustachian tubes  Rhinosinusitis I  recommended trying Flonase 2 sprays each nostril daily coupled with Xyzal 5 mg daily to see if symptoms would improve.  Discontinue Allegra.  Hopefully we can decrease postnasal drip and drainage which I believe will help the cough.  I also believe that this may allow better communication through the eustachian tube to  equalize pressure and resolve the popping and cracking in her ears.  If the patient develops pain in her frontal or maxillary sinus or fever I would use Augmentin to treat a sinus infection however I see no evidence of a sinus infection today.  If symptoms or not improving, we can consult ENT

## 2023-10-28 ENCOUNTER — Other Ambulatory Visit: Payer: Self-pay | Admitting: Family Medicine

## 2023-10-29 ENCOUNTER — Other Ambulatory Visit: Payer: Self-pay | Admitting: Student

## 2023-10-29 NOTE — Telephone Encounter (Signed)
 Requested medication (s) are due for refill today: yes  Requested medication (s) are on the active medication list: yes  Last refill:  09/06/22 #90/3  Future visit scheduled: no  Notes to clinic:  pt overdue for CPE. Please review for refill      Requested Prescriptions  Pending Prescriptions Disp Refills   omeprazole (PRILOSEC) 20 MG capsule [Pharmacy Med Name: OMEPRAZOLE 20MG  CAPSULES] 90 capsule 3    Sig: TAKE 1 CAPSULE(20 MG) BY MOUTH DAILY     Gastroenterology: Proton Pump Inhibitors Failed - 10/29/2023 12:49 PM      Failed - Valid encounter within last 12 months    Recent Outpatient Visits           2 years ago General medical exam   [facility] Donita Brooks, MD   2 years ago Essential hypertension   [facility] Tanya Nones, Priscille Heidelberg, MD   2 years ago Essential hypertension   [facility] Tanya Nones, Priscille Heidelberg, MD   3 years ago General medical exam   [facility] Donita Brooks, MD   4 years ago General medical exam   [facility] Pickard, Priscille Heidelberg, MD

## 2023-10-31 ENCOUNTER — Ambulatory Visit: Admitting: Physical Therapy

## 2023-10-31 VITALS — BP 149/82 | HR 73

## 2023-10-31 DIAGNOSIS — R2681 Unsteadiness on feet: Secondary | ICD-10-CM

## 2023-10-31 DIAGNOSIS — R2689 Other abnormalities of gait and mobility: Secondary | ICD-10-CM

## 2023-10-31 DIAGNOSIS — Z9181 History of falling: Secondary | ICD-10-CM

## 2023-10-31 DIAGNOSIS — R42 Dizziness and giddiness: Secondary | ICD-10-CM

## 2023-10-31 NOTE — Therapy (Signed)
 OUTPATIENT PHYSICAL THERAPY TREATMENT  Patient Name: Connie Cisneros MRN: [id number] DOB:1951-06-29, 73 y.o., female Today's Date: 10/31/2023   PCP: Donita Brooks, MD REFERRING PROVIDER: Donita Brooks, MD  END OF SESSION:   PT End of Session - 10/31/23 0850     Visit Number 9    Number of Visits 11    Date for PT Re-Evaluation 11/16/23   per re-cert on 11/12/79   Authorization Type [employer]    PT Start Time 0848    PT Stop Time 0928    PT Time Calculation (min) 40 min    Equipment Utilized During Treatment Gait belt    Activity Tolerance Patient tolerated treatment well    Behavior During Therapy [facility] for tasks assessed/performed                Past Medical History:  Diagnosis Date   Arthritis    Phreesia 04/05/2020   Atypical nevus 10/09/2006   slight-mderate-right upper thigh   Atypical nevus 01/24/2015   moderate-left back   Atypical nevus 02/22/2016   severe-right abdomen (WS)   Atypical nevus 02/22/2016   mild-right low abdomen (WS)   Cancer (HCC)    melanoma   Deep vein blood clot of right lower extremity (HCC)    leg   GERD (gastroesophageal reflux disease)    Heart murmur    History of migraine headaches    Hypertension    Left carotid artery stenosis    <20%   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( [facility])   PONV (postoperative nausea and vomiting)    Psoriatic arthritis (HCC)    Rheumatoid arthritis (HCC)    Past Surgical History:  Procedure Laterality Date   ABDOMINAL HYSTERECTOMY N/A    Phreesia 04/05/2020   BREAST EXCISIONAL BIOPSY Left    CHOLECYSTECTOMY N/A 04/09/2023   Procedure: LAPAROSCOPIC CHOLECYSTECTOMY WITH ICG;  Surgeon: Andria Meuse, MD;  Location: [facility];  Service: General;  Laterality: N/A;   SKIN CANCER EXCISION     SPINE SURGERY N/A    Phreesia 04/05/2020   TOTAL VAGINAL HYSTERECTOMY     Patient Active Problem List   Diagnosis Date Noted   Benign heart murmur 05/31/2021    Vitamin D deficiency 05/31/2021   Cervical pseudoarthrosis, sequela 11/03/2020   Neck pain 11/03/2020   Malignant melanoma of skin (HCC) 03/08/2020   Irritable bowel syndrome 03/06/2020   H/O angioedema 06/17/2019   History of rheumatoid arthritis 06/17/2019   Body mass index (BMI) 35.0-35.9, adult 05/18/2019   Rheumatoid arthritis (HCC) 03/11/2019   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/18/2018   Cervical spondylosis 08/18/2018   Chronic idiopathic constipation 01/27/2018   Family history of colon cancer 01/27/2018   GERD (gastroesophageal reflux disease) 01/27/2018   History of adenomatous polyp of colon 01/27/2018   Plantar fasciitis, left 06/24/2016   HTN (hypertension) 07/20/2013   Migraine headache 07/20/2013   Thyroid cyst 07/20/2013   Hyperlipidemia 07/20/2013   Carotid artery plaque 07/20/2013   Fatigue 04/13/2013    ONSET DATE: 08/26/2023  REFERRING DIAG: R29.6 (ICD-10-CM) - Frequent falls  THERAPY DIAG:  Unsteadiness on feet  Other abnormalities of gait and mobility  History of falling  Dizziness and giddiness  Rationale for Evaluation and Treatment: Rehabilitation  SUBJECTIVE:  SUBJECTIVE STATEMENT: Reports being stiff today, rating her stiffness as a 6/10. No falls. HEP is "going"   Pt accompanied by: self  PERTINENT HISTORY: arthritis, HTN, hx of migraines (pt reports have not had one in a while)  PAIN:  Are you having pain? No Joint pain from rheumatoid arthritis.    PRECAUTIONS: Fall  RED FLAGS: None   WEIGHT BEARING RESTRICTIONS: No  FALLS: Has patient fallen in last 6 months? Yes. Number of falls at least two   LIVING ENVIRONMENT: Lives with: lives with their spouse Lives in: House/apartment Stairs: Yes: Internal: 2 full flights steps; on right  going up, on left going up, and can reach both and External: 6-8 steps; on left going up Has following equipment at home: Single point cane and shower chair  PLOF: Independent  PATIENT GOALS: "Trying to get my balance"   OBJECTIVE:  Note: Objective measures were completed at Evaluation unless otherwise noted.  COGNITION: Overall cognitive status: Within functional limits for tasks assessed   SENSATION: Pt denies numbness/tingling in all extremities    POSTURE: rounded shoulders, forward head, and increased thoracic kyphosis  LOWER EXTREMITY ROM:     Active  Right Eval Left Eval  Hip flexion    Hip extension    Hip abduction    Hip adduction    Hip internal rotation    Hip external rotation    Knee flexion    Knee extension    Ankle dorsiflexion    Ankle plantarflexion    Ankle inversion    Ankle eversion     (Blank rows = not tested)  LOWER EXTREMITY MMT:  Tested in seated position  MMT Right Eval Left Eval  Hip flexion 5 5  Hip extension    Hip abduction 5 5  Hip adduction 5 5  Hip internal rotation    Hip external rotation    Knee flexion 5 5  Knee extension 5 5  Ankle dorsiflexion 5 5  Ankle plantarflexion    Ankle inversion    Ankle eversion    (Blank rows = not tested)  BED MOBILITY:  Independent per pt  TRANSFERS: Assistive device utilized: None  Sit to stand: Modified independence Stand to sit: Modified independence   GAIT: Gait pattern: step through pattern, decreased step length- Right, decreased stride length, decreased hip/knee flexion- Right, decreased ankle dorsiflexion- Right, lateral hip instability, wide BOS, and poor foot clearance- Right Distance walked: Various clinic distances  Assistive device utilized: None Level of assistance: Modified independence Comments: Noted decreased eccentric control of RLE IC as well as decreased step clearance of RLE.   FUNCTIONAL TESTS:  MCTSIB: Condition 1: Avg of 3 trials: 30 sec, Condition  2: Avg of 3 trials: 30 sec, Condition 3: Avg of 3 trials: 30 sec, Condition 4: Avg of 3 trials: 16.22, 30 sec, and Total Score: 120/120 Noted significant A/P sway on conditions 2 & 4 (anterior > posterior)                        VITALS  Vitals:   10/31/23 0852  BP: (!) 149/82  Pulse: 73  TREATMENT:  Self-care/home management  Assessed vitals (see above) and WNL   Ther Act  SciFit multi-peaks level 8 for 8 minutes using BUE/BLEs for neural priming for reciprocal movement, dynamic cardiovascular warmup and improved mobility due to RA flair. Cued pt to maintain steps/min > 75 throughout. RPE of 7/10 following activity.    NMR 6 Blaze pods on random setting for improved visual scanning, stability w/reaching out of BOS and LE coordination.  Performed on 2 minute intervals with 1 minute rest periods.  Pt requires CGA guarding. Round 1:  15' lateral line setup w/boomwhacker to reach and hit pods.  14 hits. Round 2:  on blue mat w/3 pods placed on either side, provided boomwhacker to hit pods.  16 hits. Notable errors/deficits:  difficulty w/depth perception on L side, w/pt undershooting pod. Min A required x2 for anterior LOB   Self-care (cont.)  Discussed potential use of cane in community as pt reports she feels unstable and frequently holds onto her husband for support. Pt in agreement to trial next session  Discussed appointment w/Dr. Jonny Ruiz and pt reports she has not noticed any improvement w/use of Flonase. Encouraged pt to call Optometrist to have L eye examined as it has been bothering her for several months and pt demonstrates impaired depth perception to L.    PATIENT EDUCATION: Education details: continue HEP, see self-care  Person educated: Patient Education method: Explanation Education comprehension: verbalized understanding and needs further education  HOME EXERCISE PROGRAM: Standing  horizontal VOR x1 30 seconds - progressed to busy background   Access Code: [email or web address] URL: [email or web address] Date: 10/17/2023 Prepared by: Sherlie Ban  Exercises - Romberg Stance Eyes Closed on Foam Pad  - 1-2 x daily - 5 x weekly - 3 sets - 30 hold - Romberg Stance on Foam Pad with Head Rotation  - 1-2 x daily - 5 x weekly - 2 sets - 10 reps - Tandem Walking with Counter Support  - 1-2 x daily - 5 x weekly - 3 sets - Walking with Head Rotation  - 1 x daily - 5 x weekly - 3 sets  GOALS: Goals reviewed with patient? Yes   LONG TERM GOALS: Target date: 10/17/2023   Pt will improve 5 x STS to less than or equal to 18 seconds w/no BUE support to demonstrate improved functional strength and transfer efficiency.   Baseline: 22.38s no UE support   12.3 seconds with no UE support  Goal status: MET  2. Pt will improve FGA to at least a 22/30 in order to demo decr fall risk.  Baseline: 18/30  22/30  Goal status:MET  3.  SOT goal  Baseline: 79 Goal status: DISCONTINUED - pt did not have her eyes closed on conditions 5/6, not accurate   4.  Pt will be independent with final HEP for improved vestibular input, balance and gait.  Baseline:  Goal status:ON-GOING   UPDATED/ONGOING LTGS FOR RE-CERT:  LONG TERM GOALS:Target date: 11/14/2023  1. Pt will improve FGA to at least a 24/30 in order to demo decr fall risk.  Baseline: 18/30  22/30  Goal status: REVISED  3.  Pt will perform DVA in 2 line difference or less in order to demo improved VOR for vestibular input  Baseline: 3 line difference and wooziness Goal status: NEW  4.  Pt will be independent with final HEP for improved vestibular input, balance and gait.  Baseline:  Goal status:ON-GOING   ASSESSMENT:  CLINICAL IMPRESSION: Emphasis of skilled PT  session on visual scanning, LE coordination and stability reaching out of BOS. Pt demonstrates impaired depth perception (undershooting) to lower L  quadrant and continues to report feeling of heaviness in that eye. Pt did demonstrate two anterior LOB episodes w/blaze pod activity but was able to recover w/min A. Continue POC.   OBJECTIVE IMPAIRMENTS: Abnormal gait, decreased balance, decreased mobility, difficulty walking, decreased strength, dizziness, and pain  ACTIVITY LIMITATIONS: carrying, lifting, bending, stairs, and locomotion level  PARTICIPATION LIMITATIONS: cleaning, laundry, shopping, community activity, and yard work  PERSONAL FACTORS: Age, Fitness, and 1 comorbidity: Arthritis  are also affecting patient's functional outcome.   REHAB POTENTIAL: Good  CLINICAL DECISION MAKING: Evolving/moderate complexity  EVALUATION COMPLEXITY: Moderate  PLAN:  PT FREQUENCY: 1x/week  PT DURATION: 4 weeks  PLANNED INTERVENTIONS: 97164- PT Re-evaluation, 97110-Therapeutic exercises, 97530- Therapeutic activity, [id number]- Neuromuscular re-education, 97535- Self Care, 16109- Manual therapy, [id number]- Gait training, [phone]- Canalith repositioning, Patient/Family education, Balance training, Stair training, Dry Needling, Vestibular training, and DME instructions  PLAN FOR NEXT SESSION:EC balance, head motion, unlevel surfaces. Progress VOR and vestibular system for balance, add balance exercises with visual screening and challenges (busy background as tolerated), surge walking, blaze pods on mirror/beam. Trial cane   Kush Farabee E Asuna Peth, PT, DPT 10/31/2023, 9:39 AM

## 2023-11-01 ENCOUNTER — Other Ambulatory Visit: Payer: Self-pay | Admitting: Family Medicine

## 2023-11-03 NOTE — Telephone Encounter (Signed)
 Requested medications are due for refill today.  yes  Requested medications are on the active medications list.  yes  Last refill. 10/24/2023 #20 0 rf  Future visit scheduled.   no  Notes to clinic.  Refill not delegated.    Requested Prescriptions  Pending Prescriptions Disp Refills   amoxicillin-clavulanate (AUGMENTIN) 875-125 MG tablet [Pharmacy Med Name: AMOX-CLAV 875-125MG  TABLETS] 20 tablet 0    Sig: TAKE 1 TABLET BY MOUTH TWICE DAILY     Off-Protocol Failed - 11/03/2023  4:34 PM      Failed - Medication not assigned to a protocol, review manually.      Failed - Valid encounter within last 12 months    Recent Outpatient Visits           1 week ago Dysfunction of both eustachian tubes   [place] [facility] Tanya Nones, Priscille Heidelberg, MD   2 months ago Frequent falls   [place] [facility] Pickard, Priscille Heidelberg, MD   1 year ago Colon cancer screening   [place] [facility] Donita Brooks, MD   1 year ago Postmenopausal estrogen deficiency   [place] [facility] Pickard, Priscille Heidelberg, MD   1 year ago Guttate psoriasis   [place] [facility] Pickard, Priscille Heidelberg, MD

## 2023-11-07 ENCOUNTER — Ambulatory Visit: Attending: Family Medicine | Admitting: Physical Therapy

## 2023-11-07 ENCOUNTER — Other Ambulatory Visit: Payer: Self-pay | Admitting: Student

## 2023-11-07 ENCOUNTER — Encounter: Payer: Self-pay | Admitting: Physical Therapy

## 2023-11-07 VITALS — BP 129/80 | HR 79

## 2023-11-07 DIAGNOSIS — R42 Dizziness and giddiness: Secondary | ICD-10-CM | POA: Diagnosis present

## 2023-11-07 DIAGNOSIS — Z9181 History of falling: Secondary | ICD-10-CM | POA: Diagnosis present

## 2023-11-07 DIAGNOSIS — R2689 Other abnormalities of gait and mobility: Secondary | ICD-10-CM | POA: Diagnosis present

## 2023-11-07 DIAGNOSIS — R2681 Unsteadiness on feet: Secondary | ICD-10-CM | POA: Insufficient documentation

## 2023-11-07 NOTE — Therapy (Signed)
 OUTPATIENT PHYSICAL THERAPY TREATMENT  Patient Name: Connie Cisneros MRN: [id number] DOB:04-26-1951, 73 y.o., female Today's Date: 11/07/2023   PCP: Donita Brooks, MD REFERRING PROVIDER: Donita Brooks, MD  END OF SESSION:   PT End of Session - 11/07/23 0849     Visit Number 10    Number of Visits 11    Date for PT Re-Evaluation 11/16/23   per re-cert on 1/32/44   Authorization Type [employer]    PT Start Time 0849    PT Stop Time 0929    PT Time Calculation (min) 40 min    Equipment Utilized During Treatment Gait belt    Activity Tolerance Patient tolerated treatment well    Behavior During Therapy [facility] for tasks assessed/performed                Past Medical History:  Diagnosis Date   Arthritis    Phreesia 04/05/2020   Atypical nevus 10/09/2006   slight-mderate-right upper thigh   Atypical nevus 01/24/2015   moderate-left back   Atypical nevus 02/22/2016   severe-right abdomen (WS)   Atypical nevus 02/22/2016   mild-right low abdomen (WS)   Cancer (HCC)    melanoma   Deep vein blood clot of right lower extremity (HCC)    leg   GERD (gastroesophageal reflux disease)    Heart murmur    History of migraine headaches    Hypertension    Left carotid artery stenosis    <20%   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( [facility] surgery [facility])   PONV (postoperative nausea and vomiting)    Psoriatic arthritis (HCC)    Rheumatoid arthritis (HCC)    Past Surgical History:  Procedure Laterality Date   ABDOMINAL HYSTERECTOMY N/A    Phreesia 04/05/2020   BREAST EXCISIONAL BIOPSY Left    CHOLECYSTECTOMY N/A 04/09/2023   Procedure: LAPAROSCOPIC CHOLECYSTECTOMY WITH ICG;  Surgeon: Andria Meuse, MD;  Location: [facility];  Service: General;  Laterality: N/A;   SKIN CANCER EXCISION     SPINE SURGERY N/A    Phreesia 04/05/2020   TOTAL VAGINAL HYSTERECTOMY     Patient Active Problem List   Diagnosis Date Noted   Benign heart murmur 05/31/2021    Vitamin D deficiency 05/31/2021   Cervical pseudoarthrosis, sequela 11/03/2020   Neck pain 11/03/2020   Malignant melanoma of skin (HCC) 03/08/2020   Irritable bowel syndrome 03/06/2020   H/O angioedema 06/17/2019   History of rheumatoid arthritis 06/17/2019   Body mass index (BMI) 35.0-35.9, adult 05/18/2019   Rheumatoid arthritis (HCC) 03/11/2019   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/18/2018   Cervical spondylosis 08/18/2018   Chronic idiopathic constipation 01/27/2018   Family history of colon cancer 01/27/2018   GERD (gastroesophageal reflux disease) 01/27/2018   History of adenomatous polyp of colon 01/27/2018   Plantar fasciitis, left 06/24/2016   HTN (hypertension) 07/20/2013   Migraine headache 07/20/2013   Thyroid cyst 07/20/2013   Hyperlipidemia 07/20/2013   Carotid artery plaque 07/20/2013   Fatigue 04/13/2013    ONSET DATE: 08/26/2023  REFERRING DIAG: R29.6 (ICD-10-CM) - Frequent falls  THERAPY DIAG:  Unsteadiness on feet  Other abnormalities of gait and mobility  Dizziness and giddiness  History of falling  Rationale for Evaluation and Treatment: Rehabilitation  SUBJECTIVE:  SUBJECTIVE STATEMENT: Saw the PCP and was given Flonase for her sinuses. Wanted her to give it a couple weeks to see if it helped at all. Reports it has been helping a little bit. Seeing the eye doctor next week.   Pt accompanied by: self  PERTINENT HISTORY: arthritis, HTN, hx of migraines (pt reports have not had one in a while)  PAIN:  Are you having pain? No Joint pain from rheumatoid arthritis.   PRECAUTIONS: Fall  RED FLAGS: None   WEIGHT BEARING RESTRICTIONS: No  FALLS: Has patient fallen in last 6 months? Yes. Number of falls at least two   LIVING ENVIRONMENT: Lives with:  lives with their spouse Lives in: House/apartment Stairs: Yes: Internal: 2 full flights steps; on right going up, on left going up, and can reach both and External: 6-8 steps; on left going up Has following equipment at home: Single point cane and shower chair  PLOF: Independent  PATIENT GOALS: "Trying to get my balance"   OBJECTIVE:  Note: Objective measures were completed at Evaluation unless otherwise noted.  COGNITION: Overall cognitive status: Within functional limits for tasks assessed   SENSATION: Pt denies numbness/tingling in all extremities    POSTURE: rounded shoulders, forward head, and increased thoracic kyphosis  LOWER EXTREMITY ROM:     Active  Right Eval Left Eval  Hip flexion    Hip extension    Hip abduction    Hip adduction    Hip internal rotation    Hip external rotation    Knee flexion    Knee extension    Ankle dorsiflexion    Ankle plantarflexion    Ankle inversion    Ankle eversion     (Blank rows = not tested)  LOWER EXTREMITY MMT:  Tested in seated position  MMT Right Eval Left Eval  Hip flexion 5 5  Hip extension    Hip abduction 5 5  Hip adduction 5 5  Hip internal rotation    Hip external rotation    Knee flexion 5 5  Knee extension 5 5  Ankle dorsiflexion 5 5  Ankle plantarflexion    Ankle inversion    Ankle eversion    (Blank rows = not tested)  BED MOBILITY:  Independent per pt  TRANSFERS: Assistive device utilized: None  Sit to stand: Modified independence Stand to sit: Modified independence   GAIT: Gait pattern: step through pattern, decreased step length- Right, decreased stride length, decreased hip/knee flexion- Right, decreased ankle dorsiflexion- Right, lateral hip instability, wide BOS, and poor foot clearance- Right Distance walked: Various clinic distances  Assistive device utilized: None Level of assistance: Modified independence Comments: Noted decreased eccentric control of RLE IC as well as  decreased step clearance of RLE.   FUNCTIONAL TESTS:  MCTSIB: Condition 1: Avg of 3 trials: 30 sec, Condition 2: Avg of 3 trials: 30 sec, Condition 3: Avg of 3 trials: 30 sec, Condition 4: Avg of 3 trials: 16.22, 30 sec, and Total Score: 120/120 Noted significant A/P sway on conditions 2 & 4 (anterior > posterior)                        VITALS  Vitals:   11/07/23 0856  BP: 129/80  Pulse: 79  TREATMENT:  VITALS  Vitals:   11/07/23 0856  BP: 129/80  Pulse: 79    GAIT: Gait pattern: step through pattern, decreased step length- Right, decreased stride length, decreased hip/knee flexion- Right, decreased ankle dorsiflexion- Right, lateral hip instability, wide BOS, and poor foot clearance- Right Distance walked: 460' x 1  with SPC, 115' x 1 with hurry cane  Assistive device utilized: Single point cane Level of assistance: SBA and CGA Comments: Initial cues for proper sequencing with cane, pt holding cane in L hand. Pt taking initial incr time for sequencing and looking down at the ground. With incr distance and practice, cues to look up to see where she is going. 1 episode of min A due to pt catching her toe (unclear if R or L). Pt did catch her toe another time, but was able to catch her balance with CGA. Pt reports that she frequently catches her foot during gait and thinks it is due to the shoes she is wearing. With incr practice with cane and sequencing, pt able to ambulate with supervision. Discussed will continue to practice with cane, but would be beneficial for being out in the community instead of pt holding on to her husband and having an extra point of stability. Pt reports her husband has a few canes, discussed proper height of cane.    STAIRS:  Level of Assistance: SBA  Stair Negotiation Technique: Step to Pattern Forwards With use of AD: SPC  with Single Rail on Right  Number of  Stairs: 4, 2 sets    Height of Stairs: 6"  Comments: Initial cues for proper sequencing with stairs with pt able to perform with supervision with no issues with use of cane.   NMR On air ex: Feet hip width > feet together 2 x 10 reps CW/CCW ball circles for improved gaze stabilization, pt with incr postural sway with feet together, more challenged in CCW direction Alternating forward stepping and adding in head turns to R/L 10 reps each side, also performed alternating backwards stepping with head turns to R/L 10 reps each side, pt more challenged with backwards stepping with balance and when having to turn head to the L, intermittent taps to counter for balance    PATIENT EDUCATION: Education details: Investment banker, operational with SPC and using one in the community to help with balance instead of holding onto pt's husband   Person educated: Patient Education method: Programmer, multimedia, Demonstration, and Verbal cues Education comprehension: verbalized understanding, returned demonstration, and needs further education  HOME EXERCISE PROGRAM: Standing horizontal VOR x1 30 seconds - progressed to busy background   Access Code: [email or web address] URL: [email or web address] Date: 10/17/2023 Prepared by: Sherlie Ban  Exercises - Romberg Stance Eyes Closed on Foam Pad  - 1-2 x daily - 5 x weekly - 3 sets - 30 hold - Romberg Stance on Foam Pad with Head Rotation  - 1-2 x daily - 5 x weekly - 2 sets - 10 reps - Tandem Walking with Counter Support  - 1-2 x daily - 5 x weekly - 3 sets - Walking with Head Rotation  - 1 x daily - 5 x weekly - 3 sets  GOALS: Goals reviewed with patient? Yes   LONG TERM GOALS: Target date: 10/17/2023   Pt will improve 5 x STS to less than or equal to 18 seconds w/no BUE support to demonstrate improved functional strength and transfer efficiency.   Baseline: 22.38s no UE support   12.3 seconds with no UE support  Goal status: MET  2. Pt will improve FGA to at least  a 22/30 in order to demo decr fall risk.  Baseline: 18/30  22/30  Goal status:MET  3.  SOT goal  Baseline: 79 Goal status: DISCONTINUED - pt did not have her eyes closed on conditions 5/6, not accurate   4.  Pt will be independent with final HEP for improved vestibular input, balance and gait.  Baseline:  Goal status:ON-GOING   UPDATED/ONGOING LTGS FOR RE-CERT:  LONG TERM GOALS:Target date: 11/14/2023  1. Pt will improve FGA to at least a 24/30 in order to demo decr fall risk.  Baseline: 18/30  22/30  Goal status: REVISED  3.  Pt will perform DVA in 2 line difference or less in order to demo improved VOR for vestibular input  Baseline: 3 line difference and wooziness Goal status: NEW  4.  Pt will be independent with final HEP for improved vestibular input, balance and gait.  Baseline:  Goal status:ON-GOING   ASSESSMENT:  CLINICAL IMPRESSION: Today's skilled session focused on working on gait training with [facility] for improved balance and stability. Pt initially needing cues for sequencing, but pt able to demo improvements with practice. When pt then would get off sequence, would able to self correct on her own. Pt did have an episode of tripping over one of her feet, requiring min A for balance. Pt reporting that this happens frequently. Discussed a SPC would be good in the community for an extra point of stability for balance. With balance tasks with head motions, pt more challenged with turning head to the L. Will continue per POC.    OBJECTIVE IMPAIRMENTS: Abnormal gait, decreased balance, decreased mobility, difficulty walking, decreased strength, dizziness, and pain  ACTIVITY LIMITATIONS: carrying, lifting, bending, stairs, and locomotion level  PARTICIPATION LIMITATIONS: cleaning, laundry, shopping, community activity, and yard work  PERSONAL FACTORS: Age, Fitness, and 1 comorbidity: Arthritis  are also affecting patient's functional outcome.   REHAB POTENTIAL:  Good  CLINICAL DECISION MAKING: Evolving/moderate complexity  EVALUATION COMPLEXITY: Moderate  PLAN:  PT FREQUENCY: 1x/week  PT DURATION: 4 weeks  PLANNED INTERVENTIONS: 97164- PT Re-evaluation, 97110-Therapeutic exercises, 97530- Therapeutic activity, 97112- Neuromuscular re-education, 97535- Self Care, 16109- Manual therapy, [phone]- Gait training, [phone]- Canalith repositioning, Patient/Family education, Balance training, Stair training, Dry Needling, Vestibular training, and DME instructions  PLAN FOR NEXT SESSION: check goals, plan for D/C? Try cane for outside?    Drake Leach, PT, DPT 11/07/2023, 9:35 AM

## 2023-11-12 ENCOUNTER — Other Ambulatory Visit: Payer: Self-pay | Admitting: Cardiovascular Disease

## 2023-11-14 ENCOUNTER — Ambulatory Visit: Admitting: Physical Therapy

## 2023-11-14 VITALS — BP 117/77 | HR 74

## 2023-11-14 DIAGNOSIS — R2681 Unsteadiness on feet: Secondary | ICD-10-CM | POA: Diagnosis not present

## 2023-11-14 DIAGNOSIS — R2689 Other abnormalities of gait and mobility: Secondary | ICD-10-CM

## 2023-11-14 DIAGNOSIS — R42 Dizziness and giddiness: Secondary | ICD-10-CM

## 2023-11-14 DIAGNOSIS — Z9181 History of falling: Secondary | ICD-10-CM

## 2023-11-14 NOTE — Therapy (Signed)
 OUTPATIENT PHYSICAL THERAPY TREATMENT- DISCHARGE SUMMARY   Patient Name: Connie Cisneros MRN: [id number] DOB:03-Jun-1951, 73 y.o., female Today's Date: 11/14/2023   PCP: Donita Brooks, MD REFERRING PROVIDER: Donita Brooks, MD  PHYSICAL THERAPY DISCHARGE SUMMARY  Visits from Start of Care: 11  Current functional level related to goals / functional outcomes: Mod I - S* w/mobility both with and without SPC   Remaining deficits: High fall risk (due to frequency of recent falls), impaired balance, impaired visual acuity and decreased safety awareness    Education / Equipment: HEP   Patient agrees to discharge. Patient goals were partially met. Patient is being discharged due to meeting the stated rehab goals.   END OF SESSION:   PT End of Session - 11/14/23 0853     Visit Number 11    Number of Visits 11    Date for PT Re-Evaluation 11/16/23   per re-cert on 11/12/79   Authorization Type [employer]    PT Start Time 0851   Pt arrived late   PT Stop Time 0919   DC   PT Time Calculation (min) 28 min    Equipment Utilized During Treatment Gait belt    Activity Tolerance Patient tolerated treatment well    Behavior During Therapy WFL for tasks assessed/performed                 Past Medical History:  Diagnosis Date   Arthritis    Phreesia 04/05/2020   Atypical nevus 10/09/2006   slight-mderate-right upper thigh   Atypical nevus 01/24/2015   moderate-left back   Atypical nevus 02/22/2016   severe-right abdomen (WS)   Atypical nevus 02/22/2016   mild-right low abdomen (WS)   Cancer (HCC)    melanoma   Deep vein blood clot of right lower extremity (HCC)    leg   GERD (gastroesophageal reflux disease)    Heart murmur    History of migraine headaches    Hypertension    Left carotid artery stenosis    <20%   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( [facility])   PONV (postoperative nausea and vomiting)    Psoriatic arthritis (HCC)     Rheumatoid arthritis (HCC)    Past Surgical History:  Procedure Laterality Date   ABDOMINAL HYSTERECTOMY N/A    Phreesia 04/05/2020   BREAST EXCISIONAL BIOPSY Left    CHOLECYSTECTOMY N/A 04/09/2023   Procedure: LAPAROSCOPIC CHOLECYSTECTOMY WITH ICG;  Surgeon: Andria Meuse, MD;  Location: [facility];  Service: General;  Laterality: N/A;   SKIN CANCER EXCISION     SPINE SURGERY N/A    Phreesia 04/05/2020   TOTAL VAGINAL HYSTERECTOMY     Patient Active Problem List   Diagnosis Date Noted   Benign heart murmur 05/31/2021   Vitamin D deficiency 05/31/2021   Cervical pseudoarthrosis, sequela 11/03/2020   Neck pain 11/03/2020   Malignant melanoma of skin (HCC) 03/08/2020   Irritable bowel syndrome 03/06/2020   H/O angioedema 06/17/2019   History of rheumatoid arthritis 06/17/2019   Body mass index (BMI) 35.0-35.9, adult 05/18/2019   Rheumatoid arthritis (HCC) 03/11/2019   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/18/2018   Cervical spondylosis 08/18/2018   Chronic idiopathic constipation 01/27/2018   Family history of colon cancer 01/27/2018   GERD (gastroesophageal reflux disease) 01/27/2018   History of adenomatous polyp of colon 01/27/2018   Plantar fasciitis, left 06/24/2016   HTN (hypertension) 07/20/2013   Migraine headache 07/20/2013   Thyroid cyst 07/20/2013  Hyperlipidemia 07/20/2013   Carotid artery plaque 07/20/2013   Fatigue 04/13/2013    ONSET DATE: 08/26/2023  REFERRING DIAG: R29.6 (ICD-10-CM) - Frequent falls  THERAPY DIAG:  Unsteadiness on feet  Other abnormalities of gait and mobility  Dizziness and giddiness  History of falling  Rationale for Evaluation and Treatment: Rehabilitation  SUBJECTIVE:                                                                                                                                                                                             SUBJECTIVE STATEMENT: Pt reports doing okay, "I am stressed and  tired". Reports she sees the eye doctor next week. Flonase has helped a little bit, but still has full ears every now and then. Is using the cane a bit in the community, often forgets it. Denies falls.   Pt accompanied by: self  PERTINENT HISTORY: arthritis, HTN, hx of migraines (pt reports have not had one in a while)  PAIN:  Are you having pain? No Joint pain from rheumatoid arthritis.   PRECAUTIONS: Fall  RED FLAGS: None   WEIGHT BEARING RESTRICTIONS: No  FALLS: Has patient fallen in last 6 months? Yes. Number of falls at least two   LIVING ENVIRONMENT: Lives with: lives with their spouse Lives in: House/apartment Stairs: Yes: Internal: 2 full flights steps; on right going up, on left going up, and can reach both and External: 6-8 steps; on left going up Has following equipment at home: Single point cane and shower chair  PLOF: Independent  PATIENT GOALS: "Trying to get my balance"   OBJECTIVE:  Note: Objective measures were completed at Evaluation unless otherwise noted.  COGNITION: Overall cognitive status: Within functional limits for tasks assessed   SENSATION: Pt denies numbness/tingling in all extremities    POSTURE: rounded shoulders, forward head, and increased thoracic kyphosis  LOWER EXTREMITY ROM:     Active  Right Eval Left Eval  Hip flexion    Hip extension    Hip abduction    Hip adduction    Hip internal rotation    Hip external rotation    Knee flexion    Knee extension    Ankle dorsiflexion    Ankle plantarflexion    Ankle inversion    Ankle eversion     (Blank rows = not tested)  LOWER EXTREMITY MMT:  Tested in seated position  MMT Right Eval Left Eval  Hip flexion 5 5  Hip extension    Hip abduction 5 5  Hip adduction 5 5  Hip internal rotation    Hip external rotation    Knee  flexion 5 5  Knee extension 5 5  Ankle dorsiflexion 5 5  Ankle plantarflexion    Ankle inversion    Ankle eversion    (Blank rows = not  tested)  BED MOBILITY:  Independent per pt  TRANSFERS: Assistive device utilized: None  Sit to stand: Modified independence Stand to sit: Modified independence   GAIT: Gait pattern: step through pattern, decreased step length- Right, decreased stride length, decreased hip/knee flexion- Right, decreased ankle dorsiflexion- Right, lateral hip instability, wide BOS, and poor foot clearance- Right Distance walked: Various clinic distances  Assistive device utilized: None Level of assistance: Modified independence Comments: Noted decreased eccentric control of RLE IC as well as decreased step clearance of RLE. Pt w/multiple scuff marks on toes of shoes, L >R   FUNCTIONAL TESTS:  MCTSIB: Condition 1: Avg of 3 trials: 30 sec, Condition 2: Avg of 3 trials: 30 sec, Condition 3: Avg of 3 trials: 30 sec, Condition 4: Avg of 3 trials: 16.22, 30 sec, and Total Score: 120/120 Noted significant A/P sway on conditions 2 & 4 (anterior > posterior)                        VITALS  Vitals:   11/14/23 0858  BP: 117/77  Pulse: 74                                                                                              TREATMENT:  Self-care/home management  Assessed vitals (see above) and WNL  Discussed how to return to PT in future if mobility needs change  Pt worried about having MS, so educated pt on MS symptoms and informed pt to speak to MD about this if she has concerns.  Reiterated importance of slowing down when performing dual-tasks as pt tends to fall when she is rushing. Pt verbalized understanding.    Physical Performance   [facility] PT Assessment - 11/14/23 0902       Functional Gait  Assessment   Gait assessed  Yes    Gait Level Surface Walks 20 ft in less than 7 sec but greater than 5.5 sec, uses assistive device, slower speed, mild gait deviations, or deviates 6-10 in outside of the 12 in walkway width.   6.12s   Change in Gait Speed Able to smoothly change walking speed without  loss of balance or gait deviation. Deviate no more than 6 in outside of the 12 in walkway width.    Gait with Horizontal Head Turns Performs head turns smoothly with no change in gait. Deviates no more than 6 in outside 12 in walkway width    Gait with Vertical Head Turns Performs head turns with no change in gait. Deviates no more than 6 in outside 12 in walkway width.    Gait and Pivot Turn Pivot turns safely within 3 sec and stops quickly with no loss of balance.    Step Over Obstacle Is able to step over 2 stacked shoe boxes taped together (9 in total height) without changing gait speed. No evidence of imbalance.   tripped over L  foot after stepping over   Gait with Narrow Base of Support Is able to ambulate for 10 steps heel to toe with no staggering.    Gait with Eyes Closed Walks 20 ft, no assistive devices, good speed, no evidence of imbalance, normal gait pattern, deviates no more than 6 in outside 12 in walkway width. Ambulates 20 ft in less than 7 sec.   7s   Ambulating Backwards Walks 20 ft, uses assistive device, slower speed, mild gait deviations, deviates 6-10 in outside 12 in walkway width.   14.53s   Steps Alternating feet, must use rail.    Total Score 27    FGA comment: low fall risk               PATIENT EDUCATION: Education details: Goal results, see self-care section  Person educated: Patient Education method: Explanation Education comprehension: verbalized understanding  HOME EXERCISE PROGRAM: Standing horizontal VOR x1 30 seconds - progressed to busy background   Access Code: [email or web address] URL: [email or web address] Date: 10/17/2023 Prepared by: Sherlie Ban  Exercises - Romberg Stance Eyes Closed on Foam Pad  - 1-2 x daily - 5 x weekly - 3 sets - 30 hold - Romberg Stance on Foam Pad with Head Rotation  - 1-2 x daily - 5 x weekly - 2 sets - 10 reps - Tandem Walking with Counter Support  - 1-2 x daily - 5 x weekly - 3 sets - Walking with Head  Rotation  - 1 x daily - 5 x weekly - 3 sets  GOALS: Goals reviewed with patient? Yes   LONG TERM GOALS: Target date: 10/17/2023   Pt will improve 5 x STS to less than or equal to 18 seconds w/no BUE support to demonstrate improved functional strength and transfer efficiency.   Baseline: 22.38s no UE support   12.3 seconds with no UE support  Goal status: MET  2. Pt will improve FGA to at least a 22/30 in order to demo decr fall risk.  Baseline: 18/30  22/30  Goal status:MET  3.  SOT goal  Baseline: 79 Goal status: DISCONTINUED - pt did not have her eyes closed on conditions 5/6, not accurate   4.  Pt will be independent with final HEP for improved vestibular input, balance and gait.  Baseline:  Goal status:ON-GOING   UPDATED/ONGOING LTGS FOR RE-CERT:  LONG TERM GOALS:Target date: 11/14/2023  1. Pt will improve FGA to at least a 24/30 in order to demo decr fall risk.  Baseline: 18/30  22/30; 27/30 (4/11)  Goal status: MET  3.  Pt will perform DVA in 2 line difference or less in order to demo improved VOR for vestibular input  Baseline: 3 line difference and wooziness Goal status: NOT ASSESSED   4.  Pt will be independent with final HEP for improved vestibular input, balance and gait.  Baseline:  Goal status:MET   ASSESSMENT:  CLINICAL IMPRESSION: Emphasis of skilled PT session on LTG assessment and DC from PT. Pt has met 2 of 3 LTGs, with her third LTG not being assessed this date as pt denied dizziness. Pt has significantly improved her score on FGA to 27/30, indicative of low fall risk. However, due to pt's recent falls, pt continues to be a high fall risk. Pt very inconsistent w/gait and balance, sometimes requiring up to minA  for stability due to tripping over feet. However, pt mod I today but did have single tripping episode over L foot due to unknown etiology. Pt  worried about having MS, so encouraged pt to discuss this with MD. Pt very inconsistent  w/balance and cognition but has made notable improvements in PT. Pt in agreement to DC this date and will return in future if mobility changes.    OBJECTIVE IMPAIRMENTS: Abnormal gait, decreased balance, decreased mobility, difficulty walking, decreased strength, dizziness, and pain  ACTIVITY LIMITATIONS: carrying, lifting, bending, stairs, and locomotion level  PARTICIPATION LIMITATIONS: cleaning, laundry, shopping, community activity, and yard work  PERSONAL FACTORS: Age, Fitness, and 1 comorbidity: Arthritis  are also affecting patient's functional outcome.   REHAB POTENTIAL: Good  CLINICAL DECISION MAKING: Evolving/moderate complexity  EVALUATION COMPLEXITY: Moderate  PLAN:  PT FREQUENCY: 1x/week  PT DURATION: 4 weeks  PLANNED INTERVENTIONS: 97164- PT Re-evaluation, 97110-Therapeutic exercises, 97530- Therapeutic activity, 97112- Neuromuscular re-education, 97535- Self Care, 16109- Manual therapy, [phone]- Gait training, [phone]- Canalith repositioning, Patient/Family education, Balance training, Stair training, Dry Needling, Vestibular training, and DME instructions   Jill Alexanders Fuad Forget, PT, DPT 11/14/2023, 9:19 AM

## 2023-11-20 ENCOUNTER — Other Ambulatory Visit: Payer: Self-pay | Admitting: Student

## 2023-11-21 ENCOUNTER — Encounter: Payer: Self-pay | Admitting: Family Medicine

## 2023-11-21 ENCOUNTER — Ambulatory Visit: Admitting: Family Medicine

## 2023-11-21 VITALS — BP 140/86 | HR 75 | Temp 98.4°F | Ht 63.5 in | Wt 207.8 lb

## 2023-11-21 DIAGNOSIS — J329 Chronic sinusitis, unspecified: Secondary | ICD-10-CM | POA: Diagnosis not present

## 2023-11-21 MED ORDER — PREDNISONE 20 MG PO TABS: ORAL_TABLET | ORAL | 0 refills | Status: DC

## 2023-11-21 NOTE — Progress Notes (Signed)
 Subjective:    Patient ID: Connie Cisneros, female    DOB: 1951-03-20, 73 y.o.   MRN: [id number]  Sinus Problem Associated symptoms include coughing.  Cough  Patient reports a 1 week history of cough.  The cough is productive of occasional green mucus.  She also reports head congestion copious rhinorrhea and sinus pressure.  She denies any current fever.  She denies any shortness of breath.  She denies any pleurisy.  She is currently on Xyzal  and Flonase .   Past Medical History:  Diagnosis Date   Arthritis    Phreesia 04/05/2020   Atypical nevus 10/09/2006   slight-mderate-right upper thigh   Atypical nevus 01/24/2015   moderate-left back   Atypical nevus 02/22/2016   severe-right abdomen (WS)   Atypical nevus 02/22/2016   mild-right low abdomen (WS)   Cancer (HCC)    melanoma   Deep vein blood clot of right lower extremity (HCC)    leg   GERD (gastroesophageal reflux disease)    Heart murmur    History of migraine headaches    Hypertension    Left carotid artery stenosis    <20%   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( [facility] surgery center)   PONV (postoperative nausea and vomiting)    Psoriatic arthritis (HCC)    Rheumatoid arthritis (HCC)    Past Surgical History:  Procedure Laterality Date   ABDOMINAL HYSTERECTOMY N/A    Phreesia 04/05/2020   BREAST EXCISIONAL BIOPSY Left    CHOLECYSTECTOMY N/A 04/09/2023   Procedure: LAPAROSCOPIC CHOLECYSTECTOMY WITH ICG;  Surgeon: Melvenia Stabs, MD;  Location: [facility];  Service: General;  Laterality: N/A;   SKIN CANCER EXCISION     SPINE SURGERY N/A    Phreesia 04/05/2020   TOTAL VAGINAL HYSTERECTOMY     Current Outpatient Medications on File Prior to Visit  Medication Sig Dispense Refill   atorvastatin  (LIPITOR) 40 MG tablet TAKE 1 TABLET(40 MG) BY MOUTH DAILY AT 6 PM 30 tablet 0   buPROPion (WELLBUTRIN XL) 150 MG 24 hr tablet Take 150 mg by mouth daily.     carvedilol  (COREG ) 6.25 MG tablet TAKE 1  TABLET BY MOUTH TWICE DAILY 60 tablet 0   cetirizine (ZYRTEC) 10 MG tablet Take 10 mg by mouth daily.     cholestyramine  (QUESTRAN ) 4 g packet Take 1 packet (4 g total) by mouth 2 (two) times daily. 60 each 12   colestipol (COLESTID) 1 g tablet Take 2 g by mouth 2 (two) times daily.     estradiol (ESTRACE) 1 MG tablet Take 0.5 mg by mouth daily.     fluticasone  (FLONASE ) 50 MCG/ACT nasal spray Place 2 sprays into both nostrils daily. (Patient taking differently: Place 2 sprays into both nostrils daily as needed for allergies.) 16 g 6   fluticasone  (FLONASE ) 50 MCG/ACT nasal spray Place 2 sprays into both nostrils daily. 16 g 6   hydrochlorothiazide  (MICROZIDE ) 12.5 MG capsule TAKE 1 CAPSULE(12.5 MG) BY MOUTH DAILY 30 capsule 0   ibuprofen (ADVIL) 200 MG tablet Take 400 mg by mouth every 6 (six) hours as needed for moderate pain.     levocetirizine (XYZAL ) 5 MG tablet Take 1 tablet (5 mg total) by mouth every evening. 30 tablet 11   omeprazole  (PRILOSEC) 20 MG capsule TAKE 1 CAPSULE(20 MG) BY MOUTH DAILY 90 capsule 3   RESTASIS 0.05 % ophthalmic emulsion Place 1 drop into both eyes every other day.     amoxicillin -clavulanate (AUGMENTIN ) 875-125  MG tablet Take 1 tablet by mouth 2 (two) times daily. (Patient not taking: Reported on 11/21/2023) 20 tablet 0   No current facility-administered medications on file prior to visit.   Allergies  Allergen Reactions   Losartan      Unclear if it was losartan  or verapamil  that caused angioedema, both are stopped   Verapamil      Stopped by urgent care in 03/22/2016 for possible angioedema, losartan  was stopped on 03/25/16 by cardiology as it is a more likely culprit   Codeine Nausea Only   Lisinopril Other (See Comments)    Sleepy and tired   Sulfa Antibiotics Nausea Only   Wellbutrin [Bupropion] Other (See Comments)    Reaction to losartan  and verapamil    Social History   Socioeconomic History   Marital status: Married    Spouse name: Not on file    Number of children: Not on file   Years of education: Not on file   Highest education level: Not on file  Occupational History   Not on file  Tobacco Use   Smoking status: Never   Smokeless tobacco: Never  Vaping Use   Vaping status: Never Used  Substance and Sexual Activity   Alcohol use: No   Drug use: Never   Sexual activity: Not on file  Other Topics Concern   Not on file  Social History Narrative   Not on file   Social Drivers of Health   Financial Resource Strain: Not on file  Food Insecurity: Not on file  Transportation Needs: Not on file  Physical Activity: Not on file  Stress: Not on file  Social Connections: Not on file  Intimate Partner Violence: Not on file     Review of Systems  Respiratory:  Positive for cough.   All other systems reviewed and are negative.      Objective:   Physical Exam Vitals reviewed.  Constitutional:      General: She is not in acute distress.    Appearance: Normal appearance. She is well-developed. She is not ill-appearing, toxic-appearing or diaphoretic.  HENT:     Right Ear: Tympanic membrane, ear canal and external ear normal.     Left Ear: Tympanic membrane, ear canal and external ear normal.     Nose: Mucosal edema, congestion and rhinorrhea present.     Right Sinus: No maxillary sinus tenderness or frontal sinus tenderness.     Left Sinus: Maxillary sinus tenderness present. No frontal sinus tenderness.     Mouth/Throat:     Pharynx: Oropharynx is clear. No oropharyngeal exudate or posterior oropharyngeal erythema.  Eyes:     General:        Right eye: No discharge.        Left eye: No discharge.     Conjunctiva/sclera: Conjunctivae normal.  Cardiovascular:     Rate and Rhythm: Normal rate and regular rhythm.     Heart sounds: Normal heart sounds.  Pulmonary:     Effort: Pulmonary effort is normal. No respiratory distress.     Breath sounds: Normal breath sounds. No wheezing or rales.  Chest:     Chest wall: No  tenderness.  Neurological:     Mental Status: She is alert.           Assessment & Plan:  Rhinosinusitis I believe the patient is dealing with rhinosinusitis due to allergies.  Begin prednisone  taper pack in addition to Xyzal  and Flonase .  If not improving add Augmentin  875 mg twice daily for  10 days.

## 2023-12-05 ENCOUNTER — Ambulatory Visit (INDEPENDENT_AMBULATORY_CARE_PROVIDER_SITE_OTHER)

## 2023-12-05 DIAGNOSIS — Z23 Encounter for immunization: Secondary | ICD-10-CM | POA: Diagnosis not present

## 2023-12-05 NOTE — Progress Notes (Signed)
 Patient is in office today for a nurse visit for Immunization. Patient Injection was given in the  Right deltoid. Patient tolerated injection well.

## 2023-12-06 ENCOUNTER — Other Ambulatory Visit: Payer: Self-pay | Admitting: Cardiology

## 2024-03-09 ENCOUNTER — Other Ambulatory Visit: Payer: Self-pay | Admitting: Cardiology

## 2024-03-22 ENCOUNTER — Other Ambulatory Visit: Payer: Self-pay | Admitting: Student

## 2024-03-26 ENCOUNTER — Other Ambulatory Visit: Payer: Self-pay | Admitting: Student

## 2024-03-30 MED ORDER — ATORVASTATIN CALCIUM 40 MG PO TABS: ORAL_TABLET | ORAL | 0 refills | Status: AC

## 2024-04-12 ENCOUNTER — Other Ambulatory Visit: Payer: Self-pay

## 2024-04-23 ENCOUNTER — Other Ambulatory Visit: Payer: Self-pay | Admitting: Student

## 2024-05-24 ENCOUNTER — Other Ambulatory Visit: Payer: Self-pay | Admitting: Family Medicine

## 2024-06-03 NOTE — Progress Notes (Incomplete)
 Cardiology Office Note:   Date:  06/03/2024  ID:  Connie Cisneros, DOB 12-16-1950, MRN [id number] PCP: Duanne Butler DASEN, MD  [facility] Providers Cardiologist:  Debby Sor, MD (Inactive) { Chief Complaint: No chief complaint on file.     History of Present Illness:   Connie Cisneros is a 73 y.o. female with a PMH of HTN, HLD, mild nonobstructive CAS, RA/psoriatic arthritis who presents for follow up.  Last seen in clinic on 10/25/2022 by Barnie Hila.  She was experiencing DOE at that time and underwent an echocardiogram which was unremarkable.  Prior CT coronary angiography showed a CAC score of 0 and no obstructive disease.  She was previously followed by Dr. Sor for hypertension.     Past Medical History:  Diagnosis Date   Arthritis    Phreesia 04/05/2020   Atypical nevus 10/09/2006   slight-mderate-right upper thigh   Atypical nevus 01/24/2015   moderate-left back   Atypical nevus 02/22/2016   severe-right abdomen (WS)   Atypical nevus 02/22/2016   mild-right low abdomen (WS)   Cancer (HCC)    melanoma   Deep vein blood clot of right lower extremity (HCC)    leg   GERD (gastroesophageal reflux disease)    Heart murmur    History of migraine headaches    Hypertension    Left carotid artery stenosis    <20%   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( [facility])   PONV (postoperative nausea and vomiting)    Psoriatic arthritis (HCC)    Rheumatoid arthritis (HCC)      Studies Reviewed:    EKG: ***       Cardiac Studies & Procedures   ______________________________________________________________________________________________     ECHOCARDIOGRAM  ECHOCARDIOGRAM COMPLETE 11/29/2022  Narrative ECHOCARDIOGRAM REPORT    Patient Name:   Connie Cisneros Date of Exam: 11/29/2022 Medical Rec #:  [id number]       Height:       64.0 in Accession #:    [id number]      Weight:       207.8 lb Date of Birth:   Jan 07, 1951      BSA:          1.988 m Patient Age:    71 years        BP:           122/70 mmHg Patient Gender: F               HR:           75 bpm. Exam Location:  [address]  Procedure: 2D Echo, Cardiac Doppler, Color Doppler and Strain Analysis  Indications:    DOE (dyspnea on exertion) [242095]  History:        Patient has prior history of Echocardiogram examinations, most recent 02/04/2020. Signs/Symptoms:Dyspnea; Risk Factors:Hypertension and GERD.  Sonographer:    Lauraine Pilot RDCS Referring Phys: [id number] [facility]   Sonographer Comments: Global longitudinal strain was attempted. IMPRESSIONS   1. Left ventricular ejection fraction, by estimation, is 70 to 75%. The left ventricle has hyperdynamic function. The left ventricle has no regional wall motion abnormalities. Left ventricular diastolic parameters are consistent with Grade I diastolic dysfunction (impaired relaxation). The average left ventricular global longitudinal strain is -19.1 %. The global longitudinal strain is normal. 2. Right ventricular systolic function is normal. The right ventricular size is normal. 3. The mitral valve is normal in structure. Trivial mitral valve regurgitation. No  evidence of mitral stenosis. 4. The aortic valve is tricuspid. Aortic valve regurgitation is not visualized. No aortic stenosis is present. 5. The inferior vena cava is normal in size with greater than 50% respiratory variability, suggesting right atrial pressure of 3 mmHg.  FINDINGS Left Ventricle: Left ventricular ejection fraction, by estimation, is 70 to 75%. The left ventricle has hyperdynamic function. The left ventricle has no regional wall motion abnormalities. The average left ventricular global longitudinal strain is -19.1 %. The global longitudinal strain is normal. The left ventricular internal cavity size was normal in size. There is no left ventricular hypertrophy. Left ventricular diastolic parameters are  consistent with Grade I diastolic dysfunction (impaired relaxation).  Right Ventricle: The right ventricular size is normal. Right ventricular systolic function is normal.  Left Atrium: Left atrial size was normal in size.  Right Atrium: Right atrial size was normal in size.  Pericardium: There is no evidence of pericardial effusion.  Mitral Valve: The mitral valve is normal in structure. Trivial mitral valve regurgitation. No evidence of mitral valve stenosis.  Tricuspid Valve: The tricuspid valve is normal in structure. Tricuspid valve regurgitation is trivial. No evidence of tricuspid stenosis.  Aortic Valve: The aortic valve is tricuspid. Aortic valve regurgitation is not visualized. No aortic stenosis is present.  Pulmonic Valve: The pulmonic valve was normal in structure. Pulmonic valve regurgitation is not visualized. No evidence of pulmonic stenosis.  Aorta: The aortic root is normal in size and structure.  Venous: The inferior vena cava is normal in size with greater than 50% respiratory variability, suggesting right atrial pressure of 3 mmHg.  IAS/Shunts: No atrial level shunt detected by color flow Doppler.   LEFT VENTRICLE PLAX 2D LVIDd:         3.80 cm     Diastology LVIDs:         2.20 cm     LV e' medial:    9.14 cm/s LV PW:         0.90 cm     LV E/e' medial:  10.5 LV IVS:        0.90 cm     LV e' lateral:   7.38 cm/s LVOT diam:     2.10 cm     LV E/e' lateral: 13.1 LV SV:         67 LV SV Index:   33          2D Longitudinal Strain LVOT Area:     3.46 cm    2D Strain GLS Avg:     -19.1 %  LV Volumes (MOD) LV vol d, MOD A2C: 72.2 ml LV vol d, MOD A4C: 52.7 ml LV vol s, MOD A2C: 24.2 ml LV vol s, MOD A4C: 16.6 ml LV SV MOD A2C:     48.0 ml LV SV MOD A4C:     52.7 ml LV SV MOD BP:      45.0 ml  RIGHT VENTRICLE RV S prime:     15.80 cm/s TAPSE (M-mode): 2.6 cm  LEFT ATRIUM             Index        RIGHT ATRIUM          Index LA diam:        3.50 cm  1.76 cm/m   RA Area:     8.15 cm LA Vol (A2C):   29.1 ml 14.63 ml/m  RA Volume:   13.80 ml 6.94 ml/m LA Vol (A4C):  20.0 ml 10.06 ml/m LA Biplane Vol: 25.1 ml 12.62 ml/m AORTIC VALVE LVOT Vmax:   98.50 cm/s LVOT Vmean:  65.500 cm/s LVOT VTI:    0.192 m  AORTA Ao Root diam: 2.90 cm Ao Asc diam:  3.10 cm  MITRAL VALVE MV Area (PHT): 2.56 cm     SHUNTS MV Decel Time: 296 msec     Systemic VTI:  0.19 m MV E velocity: 96.40 cm/s   Systemic Diam: 2.10 cm MV A velocity: 113.00 cm/s MV E/A ratio:  0.85  Redell Shallow MD Electronically signed by Redell Shallow MD Signature Date/Time: 11/29/2022/1:49:06 PM    Final      CT SCANS  CT CORONARY MORPH W/CTA COR W/SCORE 02/04/2020  Addendum 02/08/2020  8:44 AM ADDENDUM REPORT: 02/08/2020 08:41  EXAM: OVER-READ INTERPRETATION  CT CHEST  The following report is an over-read performed by radiologist Dr. Toribio Cove [facility] on 02/08/2020. This over-read does not include interpretation of cardiac or coronary anatomy or pathology. The coronary calcium  score and cardiac CTA interpretation by the cardiologist is attached.  COMPARISON:  None.  FINDINGS: Aortic atherosclerosis. Within the visualized portions of the thorax there are no suspicious appearing pulmonary nodules or masses, there is no acute consolidative airspace disease, no pleural effusions, no pneumothorax and no lymphadenopathy. Visualized portions of the upper abdomen are unremarkable. There are no aggressive appearing lytic or blastic lesions noted in the visualized portions of the skeleton.  IMPRESSION: 1.  Aortic Atherosclerosis (ICD10-I70.0).   Electronically Signed By: Toribio Aye M.D. On: 02/08/2020 08:41  Narrative HISTORY: 73 yo female with shortness of breath  EXAM: Cardiac/Coronary CTA  TECHNIQUE: The patient was scanned on a Office Manager.  PROTOCOL: A 120 kV prospective scan was triggered in the  descending thoracic aorta at 111 HU's. Axial non-contrast 3 mm slices were carried out through the heart. The data set was analyzed on a dedicated work station and scored using the Agatson method. Gantry rotation speed was 250 msecs and collimation was .6 mm. Beta blockade and 0.8 mg of sl NTG was given. The 3D data set was reconstructed in 5% intervals of the 67-82 % of the R-R cycle. Diastolic phases were analyzed on a dedicated work station using MPR, MIP and VRT modes. The patient received 80mL OMNIPAQUE  IOHEXOL  350 MG/ML SOLN of contrast.  FINDINGS: Quality: Good, HR 77  Coronary calcium  score: The patient's coronary artery calcium  score is 0, which places the patient in the 0 percentile.  Coronary arteries: Normal coronary origins.  Right dominance.  Right Coronary Artery: Dominant.  Normal.  Left Main Coronary Artery: Normal. Bifurcates into the LAD and LCx arteries.  Left Anterior Descending Coronary Artery: Moderate sized artery that coarses anteriorly and reaches the apex. No disease. Small high D1 and D2 branches without stenosis.  Left Circumflex Artery: AV groove vessel without disease.  Aorta: Normal size, 30 mm at the mid ascending aorta (level of the PA bifurcation) measured double oblique. No calcifications. No dissection.  Aortic Valve: Trileaflet.  No calcifications.  Other findings:  Normal pulmonary vein drainage into the left atrium.  Normal left atrial appendage without a thrombus.  Normal size of the pulmonary artery.  IMPRESSION: 1. No evidence of CAD, CADRADS = 0.  2. Coronary calcium  score of 0. This was 0 percentile for age and sex matched control.  3. Normal coronary origin with right dominance.  Electronically Signed: By: Vinie JAYSON Maxcy M.D. On: 02/04/2020 17:00  ______________________________________________________________________________________________      Risk Assessment/Calculations:   {Does this patient have ATRIAL  FIBRILLATION?:[phone]} No BP recorded.  {Refresh Note OR Click here to enter BP  :1}***        Physical Exam:     VS:  There were no vitals taken for this visit. ***    Wt Readings from Last 3 Encounters:  11/21/23 207 lb 12.8 oz (94.3 kg)  10/24/23 209 lb 9.6 oz (95.1 kg)  08/26/23 202 lb 3.2 oz (91.7 kg)     GEN: Well nourished, well developed, in no acute distress NECK: No JVD; No carotid bruits CARDIAC: ***RRR, no murmurs, rubs, gallops RESPIRATORY:  Clear to auscultation without rales, wheezing or rhonchi  ABDOMEN: Soft, non-tender, non-distended, normal bowel sounds EXTREMITIES:  Warm and well perfused, no edema; No deformity, 2+ radial pulses PSYCH: Normal mood and affect   Assessment & Plan Primary hypertension  Mixed hyperlipidemia - Continue atorvastatin  40 mg daily - Annual lipid panels      {Are you ordering a CV Procedure (e.g. stress test, cath, DCCV, TEE, etc)?   Press F2        :[id number]}   This note was written with the assistance of a dictation microphone or AI dictation software. Please excuse any typos or grammatical errors.   Signed, Georganna Archer, MD 06/03/2024 9:59 PM    [facility]

## 2024-06-03 NOTE — Assessment & Plan Note (Signed)
-   Continue atorvastatin  40 mg daily - Annual lipid panels

## 2024-06-04 ENCOUNTER — Ambulatory Visit
Attending: Student in an Organized Health Care Education/Training Program | Admitting: Student in an Organized Health Care Education/Training Program

## 2024-06-04 VITALS — BP 138/72 | HR 73 | Ht 63.0 in | Wt 197.8 lb

## 2024-06-04 DIAGNOSIS — R5382 Chronic fatigue, unspecified: Secondary | ICD-10-CM | POA: Diagnosis not present

## 2024-06-04 DIAGNOSIS — I10 Essential (primary) hypertension: Secondary | ICD-10-CM | POA: Diagnosis not present

## 2024-06-04 DIAGNOSIS — E782 Mixed hyperlipidemia: Secondary | ICD-10-CM | POA: Diagnosis not present

## 2024-06-04 MED ORDER — HYDROCHLOROTHIAZIDE 25 MG PO TABS: 25.0000 mg | ORAL_TABLET | Freq: Every day | ORAL | 3 refills | Status: AC

## 2024-06-04 NOTE — Assessment & Plan Note (Addendum)
 Blood pressure is slightly above goal today.  Think she would benefit from increasing her HCTZ.  Also prescribe her blood pressure cuff and encouraged her to check her blood pressure for the next 2 weeks and send the results. -Increase HCTZ 25 mg daily -Continue Coreg  6.25 mg twice daily -Check blood pressures at home x2 weeks -Order blood pressure cuff - BMP in 1 week for potassium check

## 2024-06-04 NOTE — Assessment & Plan Note (Signed)
 Other fatigue is multifactorial including deconditioning from lack of exercise and her rheumatologic conditions.  She has had an extensive cardiovascular workup which has all been completely unremarkable.  I counseled the patient on the importance of exercise to improve overall wellness.  No additional cardiovascular workup is required at this time.

## 2024-06-04 NOTE — Patient Instructions (Signed)
 Medication Instructions:   INCREASE hydrochlorothiazide  TO 25 MG ONCE DAILY= 2 OF THE 12.5 MG TABLETS ONCE DAILY  *If you need a refill on your cardiac medications before your next appointment, please call your pharmacy*  Lab Work:  Your physician recommends that you return for lab work in: ONE WEEK-DO NOT NEED TO FAST  If you have labs (blood work) drawn today and your tests are completely normal, you will receive your results only by: MyChart Message (if you have MyChart) OR A paper copy in the mail If you have any lab test that is abnormal or we need to change your treatment, we will call you to review the results.  Follow-Up: At [facility], you and your health needs are our priority.  As part of our continuing mission to provide you with exceptional heart care, our providers are all part of one team.  This team includes your primary Cardiologist (physician) and Advanced Practice Providers or APPs (Physician Assistants and Nurse Practitioners) who all work together to provide you with the care you need, when you need it.  Your next appointment:   12 month(s)  Provider:   LONNIE SULLIVAN    We recommend signing up for the patient portal called MyChart.  Sign up information is provided on this After Visit Summary.  MyChart is used to connect with patients for Virtual Visits (Telemedicine).  Patients are able to view lab/test results, encounter notes, upcoming appointments, etc.  Non-urgent messages can be sent to your provider as well.   To learn more about what you can do with MyChart, go to [email or web address].   Other Instructions  TRACK BLOOD PRESSURE FOR 2 WEEKS AND SEND US  THE RESULTS

## 2024-06-12 LAB — BASIC METABOLIC PANEL WITH GFR
BUN/Creatinine Ratio: 14 (ref 12–28)
BUN: 13 mg/dL (ref 8–27)
CO2: 20 mmol/L (ref 20–29)
Calcium: 9.2 mg/dL (ref 8.7–10.3)
Chloride: 102 mmol/L (ref 96–106)
Creatinine, Ser: 0.93 mg/dL (ref 0.57–1.00)
Glucose: 86 mg/dL (ref 70–99)
Potassium: 4 mmol/L (ref 3.5–5.2)
Sodium: 139 mmol/L (ref 134–144)
eGFR: 66 mL/min/1.73 (ref >59)

## 2024-06-14 ENCOUNTER — Ambulatory Visit: Payer: Self-pay | Admitting: Student in an Organized Health Care Education/Training Program

## 2024-06-21 ENCOUNTER — Other Ambulatory Visit: Payer: Self-pay | Admitting: Family Medicine

## 2024-06-21 DIAGNOSIS — N63 Unspecified lump in unspecified breast: Secondary | ICD-10-CM

## 2024-07-14 ENCOUNTER — Other Ambulatory Visit: Payer: Self-pay | Admitting: General Practice

## 2024-07-16 MED ORDER — CARVEDILOL 6.25 MG PO TABS: 6.2500 mg | ORAL_TABLET | Freq: Two times a day (BID) | ORAL | 3 refills | Status: AC

## 2024-07-23 ENCOUNTER — Inpatient Hospital Stay: Admission: RE | Admit: 2024-07-23 | Discharge: 2024-07-23 | Attending: Family Medicine | Admitting: Family Medicine

## 2024-07-23 DIAGNOSIS — N63 Unspecified lump in unspecified breast: Secondary | ICD-10-CM

## 2024-09-03 ENCOUNTER — Encounter: Payer: Self-pay | Admitting: Family Medicine

## 2024-09-03 ENCOUNTER — Ambulatory Visit: Admitting: Family Medicine

## 2024-09-03 VITALS — BP 120/70 | HR 68 | Temp 98.0°F | Ht 63.0 in | Wt 196.6 lb

## 2024-09-03 DIAGNOSIS — M1991 Primary osteoarthritis, unspecified site: Secondary | ICD-10-CM | POA: Insufficient documentation

## 2024-09-03 DIAGNOSIS — Z23 Encounter for immunization: Secondary | ICD-10-CM | POA: Diagnosis not present

## 2024-09-03 DIAGNOSIS — R296 Repeated falls: Secondary | ICD-10-CM

## 2024-09-03 DIAGNOSIS — R0602 Shortness of breath: Secondary | ICD-10-CM | POA: Insufficient documentation

## 2024-09-03 DIAGNOSIS — Z79899 Other long term (current) drug therapy: Secondary | ICD-10-CM | POA: Insufficient documentation

## 2024-09-03 DIAGNOSIS — M792 Neuralgia and neuritis, unspecified: Secondary | ICD-10-CM | POA: Insufficient documentation

## 2024-09-03 DIAGNOSIS — R21 Rash and other nonspecific skin eruption: Secondary | ICD-10-CM | POA: Insufficient documentation

## 2024-09-03 DIAGNOSIS — I10 Essential (primary) hypertension: Secondary | ICD-10-CM

## 2024-09-03 DIAGNOSIS — R7689 Other specified abnormal immunological findings in serum: Secondary | ICD-10-CM | POA: Insufficient documentation

## 2024-09-03 DIAGNOSIS — R2689 Other abnormalities of gait and mobility: Secondary | ICD-10-CM | POA: Diagnosis not present

## 2024-09-03 DIAGNOSIS — Z0001 Encounter for general adult medical examination with abnormal findings: Secondary | ICD-10-CM

## 2024-09-03 DIAGNOSIS — E7800 Pure hypercholesterolemia, unspecified: Secondary | ICD-10-CM

## 2024-09-03 DIAGNOSIS — M545 Low back pain, unspecified: Secondary | ICD-10-CM | POA: Insufficient documentation

## 2024-09-03 DIAGNOSIS — Z0000 Encounter for general adult medical examination without abnormal findings: Secondary | ICD-10-CM

## 2024-09-03 DIAGNOSIS — M3501 Sicca syndrome with keratoconjunctivitis: Secondary | ICD-10-CM | POA: Insufficient documentation

## 2024-09-10 ENCOUNTER — Other Ambulatory Visit

## 2024-09-10 DIAGNOSIS — Z0000 Encounter for general adult medical examination without abnormal findings: Secondary | ICD-10-CM

## 2024-09-10 DIAGNOSIS — I10 Essential (primary) hypertension: Secondary | ICD-10-CM

## 2024-09-10 DIAGNOSIS — E7800 Pure hypercholesterolemia, unspecified: Secondary | ICD-10-CM

## 2024-09-10 LAB — CBC WITH DIFFERENTIAL/PLATELET
Absolute Lymphocytes: 1848 {cells}/uL (ref 850–3900)
Absolute Monocytes: 291 {cells}/uL (ref 200–950)
Basophils Absolute: 62 {cells}/uL (ref 0–200)
Basophils Relative: 1.1 %
Eosinophils Absolute: 73 {cells}/uL (ref 15–500)
Eosinophils Relative: 1.3 %
HCT: 39.6 % (ref 35.9–46.0)
Hemoglobin: 13.1 g/dL (ref 11.7–15.5)
MCH: 29.6 pg (ref 27.0–33.0)
MCHC: 33.1 g/dL (ref 31.6–35.4)
MCV: 89.6 fL (ref 81.4–101.7)
MPV: 12.3 fL (ref 7.5–12.5)
Monocytes Relative: 5.2 %
Neutro Abs: 3326 {cells}/uL (ref 1500–7800)
Neutrophils Relative %: 59.4 %
Platelets: 245 10*3/uL (ref 140–400)
RBC: 4.42 Million/uL (ref 3.80–5.10)
RDW: 14.3 % (ref 11.0–15.0)
Total Lymphocyte: 33 %
WBC: 5.6 10*3/uL (ref 3.8–10.8)

## 2024-09-10 LAB — VITAMIN D 25 HYDROXY (VIT D DEFICIENCY, FRACTURES): Vit D, 25-Hydroxy: 29 ng/mL — ABNORMAL LOW (ref 30–100)

## 2024-09-10 LAB — TSH: TSH: 2.59 m[IU]/L (ref 0.40–4.50)

## 2024-09-10 LAB — VITAMIN B12: Vitamin B-12: 1853 pg/mL — ABNORMAL HIGH (ref 200–1100)

## 2025-09-09 ENCOUNTER — Other Ambulatory Visit

## 2025-09-16 ENCOUNTER — Encounter: Admitting: Family Medicine
